# Patient Record
Sex: Female | Born: 1960 | Race: Black or African American | Hispanic: No | Marital: Single | Smoking: Current every day smoker
Health system: Southern US, Community
[De-identification: ages and names within clinical notes are randomized; demographics above are authoritative.]

## PROBLEM LIST (undated history)

## (undated) DIAGNOSIS — D126 Benign neoplasm of colon, unspecified: Secondary | ICD-10-CM

## (undated) DIAGNOSIS — N83209 Unspecified ovarian cyst, unspecified side: Secondary | ICD-10-CM

## (undated) DIAGNOSIS — J189 Pneumonia, unspecified organism: Secondary | ICD-10-CM

## (undated) DIAGNOSIS — Z720 Tobacco use: Secondary | ICD-10-CM

## (undated) DIAGNOSIS — D649 Anemia, unspecified: Secondary | ICD-10-CM

## (undated) DIAGNOSIS — K219 Gastro-esophageal reflux disease without esophagitis: Secondary | ICD-10-CM

## (undated) DIAGNOSIS — N390 Urinary tract infection, site not specified: Secondary | ICD-10-CM

## (undated) DIAGNOSIS — E876 Hypokalemia: Secondary | ICD-10-CM

## (undated) DIAGNOSIS — R519 Headache, unspecified: Secondary | ICD-10-CM

## (undated) DIAGNOSIS — M1710 Unilateral primary osteoarthritis, unspecified knee: Secondary | ICD-10-CM

## (undated) DIAGNOSIS — G8929 Other chronic pain: Secondary | ICD-10-CM

## (undated) DIAGNOSIS — K222 Esophageal obstruction: Secondary | ICD-10-CM

## (undated) DIAGNOSIS — M25469 Effusion, unspecified knee: Secondary | ICD-10-CM

## (undated) DIAGNOSIS — L0292 Furuncle, unspecified: Secondary | ICD-10-CM

## (undated) DIAGNOSIS — K5790 Diverticulosis of intestine, part unspecified, without perforation or abscess without bleeding: Secondary | ICD-10-CM

## (undated) DIAGNOSIS — L0293 Carbuncle, unspecified: Secondary | ICD-10-CM

## (undated) DIAGNOSIS — I10 Essential (primary) hypertension: Secondary | ICD-10-CM

## (undated) DIAGNOSIS — K635 Polyp of colon: Secondary | ICD-10-CM

## (undated) DIAGNOSIS — N905 Atrophy of vulva: Secondary | ICD-10-CM

## (undated) DIAGNOSIS — N12 Tubulo-interstitial nephritis, not specified as acute or chronic: Secondary | ICD-10-CM

## (undated) DIAGNOSIS — K449 Diaphragmatic hernia without obstruction or gangrene: Secondary | ICD-10-CM

## (undated) DIAGNOSIS — M179 Osteoarthritis of knee, unspecified: Secondary | ICD-10-CM

## (undated) HISTORY — DX: Effusion, unspecified knee: M25.469

## (undated) HISTORY — DX: Gastro-esophageal reflux disease without esophagitis: K21.9

## (undated) HISTORY — DX: Carbuncle, unspecified: L02.93

## (undated) HISTORY — DX: Diverticulosis of intestine, part unspecified, without perforation or abscess without bleeding: K57.90

## (undated) HISTORY — DX: Esophageal obstruction: K22.2

## (undated) HISTORY — DX: Furuncle, unspecified: L02.92

## (undated) HISTORY — DX: Other chronic pain: G89.29

## (undated) HISTORY — DX: Anemia, unspecified: D64.9

## (undated) HISTORY — DX: Essential (primary) hypertension: I10

## (undated) HISTORY — PX: BREAST BIOPSY: SHX20

## (undated) HISTORY — DX: Headache, unspecified: R51.9

## (undated) HISTORY — DX: Osteoarthritis of knee, unspecified: M17.9

## (undated) HISTORY — DX: Urinary tract infection, site not specified: N39.0

## (undated) HISTORY — DX: Benign neoplasm of colon, unspecified: D12.6

## (undated) HISTORY — DX: Diaphragmatic hernia without obstruction or gangrene: K44.9

## (undated) HISTORY — DX: Tubulo-interstitial nephritis, not specified as acute or chronic: N12

## (undated) HISTORY — DX: Hypokalemia: E87.6

## (undated) HISTORY — DX: Tobacco use: Z72.0

## (undated) HISTORY — DX: Atrophy of vulva: N90.5

## (undated) HISTORY — DX: Unspecified ovarian cyst, unspecified side: N83.209

## (undated) HISTORY — DX: Unilateral primary osteoarthritis, unspecified knee: M17.10

## (undated) HISTORY — DX: Polyp of colon: K63.5

## (undated) HISTORY — PX: BREAST CYST EXCISION: SHX579

## (undated) HISTORY — DX: Pneumonia, unspecified organism: J18.9

---

## 1996-02-22 HISTORY — PX: ABDOMINAL HYSTERECTOMY: SHX81

## 1997-07-11 ENCOUNTER — Encounter: Admission: RE | Admit: 1997-07-11 | Discharge: 1997-07-11 | Payer: Self-pay | Admitting: Internal Medicine

## 1997-07-17 ENCOUNTER — Encounter: Admission: RE | Admit: 1997-07-17 | Discharge: 1997-07-17 | Payer: Self-pay | Admitting: Obstetrics

## 1997-07-17 ENCOUNTER — Other Ambulatory Visit: Admission: RE | Admit: 1997-07-17 | Discharge: 1997-07-17 | Payer: Self-pay | Admitting: Obstetrics

## 1997-12-25 ENCOUNTER — Encounter: Payer: Self-pay | Admitting: Emergency Medicine

## 1997-12-25 ENCOUNTER — Emergency Department (HOSPITAL_COMMUNITY): Admission: EM | Admit: 1997-12-25 | Discharge: 1997-12-25 | Payer: Self-pay | Admitting: Emergency Medicine

## 1998-04-20 ENCOUNTER — Emergency Department (HOSPITAL_COMMUNITY): Admission: EM | Admit: 1998-04-20 | Discharge: 1998-04-20 | Payer: Self-pay | Admitting: Emergency Medicine

## 1998-04-24 ENCOUNTER — Encounter: Admission: RE | Admit: 1998-04-24 | Discharge: 1998-04-24 | Payer: Self-pay | Admitting: Internal Medicine

## 1998-06-06 ENCOUNTER — Emergency Department (HOSPITAL_COMMUNITY): Admission: EM | Admit: 1998-06-06 | Discharge: 1998-06-06 | Payer: Self-pay | Admitting: *Deleted

## 1998-06-06 ENCOUNTER — Encounter: Payer: Self-pay | Admitting: Emergency Medicine

## 1998-09-04 ENCOUNTER — Encounter: Admission: RE | Admit: 1998-09-04 | Discharge: 1998-09-04 | Payer: Self-pay | Admitting: Internal Medicine

## 1998-10-08 ENCOUNTER — Encounter: Admission: RE | Admit: 1998-10-08 | Discharge: 1998-10-08 | Payer: Self-pay | Admitting: Obstetrics

## 1998-11-04 ENCOUNTER — Ambulatory Visit (HOSPITAL_COMMUNITY): Admission: RE | Admit: 1998-11-04 | Discharge: 1998-11-04 | Payer: Self-pay

## 1998-12-04 ENCOUNTER — Ambulatory Visit (HOSPITAL_BASED_OUTPATIENT_CLINIC_OR_DEPARTMENT_OTHER): Admission: RE | Admit: 1998-12-04 | Discharge: 1998-12-04 | Payer: Self-pay | Admitting: Surgery

## 1998-12-05 ENCOUNTER — Observation Stay (HOSPITAL_COMMUNITY): Admission: EM | Admit: 1998-12-05 | Discharge: 1998-12-05 | Payer: Self-pay | Admitting: Emergency Medicine

## 1999-03-03 ENCOUNTER — Emergency Department (HOSPITAL_COMMUNITY): Admission: EM | Admit: 1999-03-03 | Discharge: 1999-03-03 | Payer: Self-pay | Admitting: Emergency Medicine

## 1999-03-03 ENCOUNTER — Encounter: Payer: Self-pay | Admitting: Emergency Medicine

## 1999-05-06 ENCOUNTER — Encounter: Admission: RE | Admit: 1999-05-06 | Discharge: 1999-05-06 | Payer: Self-pay | Admitting: Obstetrics

## 1999-05-20 ENCOUNTER — Encounter: Admission: RE | Admit: 1999-05-20 | Discharge: 1999-05-20 | Payer: Self-pay | Admitting: Obstetrics

## 1999-12-23 ENCOUNTER — Encounter: Admission: RE | Admit: 1999-12-23 | Discharge: 1999-12-23 | Payer: Self-pay | Admitting: Hematology and Oncology

## 2000-01-06 ENCOUNTER — Encounter: Admission: RE | Admit: 2000-01-06 | Discharge: 2000-01-06 | Payer: Self-pay | Admitting: Obstetrics

## 2000-01-11 ENCOUNTER — Ambulatory Visit (HOSPITAL_COMMUNITY): Admission: RE | Admit: 2000-01-11 | Discharge: 2000-01-11 | Payer: Self-pay | Admitting: Obstetrics

## 2000-08-07 ENCOUNTER — Encounter: Payer: Self-pay | Admitting: Emergency Medicine

## 2000-08-07 ENCOUNTER — Emergency Department (HOSPITAL_COMMUNITY): Admission: EM | Admit: 2000-08-07 | Discharge: 2000-08-07 | Payer: Self-pay | Admitting: Emergency Medicine

## 2000-09-19 ENCOUNTER — Ambulatory Visit (HOSPITAL_COMMUNITY): Admission: RE | Admit: 2000-09-19 | Discharge: 2000-09-19 | Payer: Self-pay | Admitting: Internal Medicine

## 2000-09-19 ENCOUNTER — Encounter (INDEPENDENT_AMBULATORY_CARE_PROVIDER_SITE_OTHER): Payer: Self-pay | Admitting: *Deleted

## 2000-09-19 ENCOUNTER — Encounter: Payer: Self-pay | Admitting: Internal Medicine

## 2000-10-19 ENCOUNTER — Encounter: Admission: RE | Admit: 2000-10-19 | Discharge: 2000-10-19 | Payer: Self-pay | Admitting: Obstetrics

## 2000-10-31 ENCOUNTER — Encounter (INDEPENDENT_AMBULATORY_CARE_PROVIDER_SITE_OTHER): Payer: Self-pay | Admitting: Specialist

## 2000-10-31 ENCOUNTER — Other Ambulatory Visit: Admission: RE | Admit: 2000-10-31 | Discharge: 2000-10-31 | Payer: Self-pay | Admitting: Internal Medicine

## 2000-10-31 ENCOUNTER — Encounter: Payer: Self-pay | Admitting: Internal Medicine

## 2001-01-11 ENCOUNTER — Ambulatory Visit (HOSPITAL_COMMUNITY): Admission: RE | Admit: 2001-01-11 | Discharge: 2001-01-11 | Payer: Self-pay | Admitting: Obstetrics

## 2001-08-14 ENCOUNTER — Encounter: Admission: RE | Admit: 2001-08-14 | Discharge: 2001-08-14 | Payer: Self-pay | Admitting: Internal Medicine

## 2001-08-17 ENCOUNTER — Ambulatory Visit (HOSPITAL_COMMUNITY): Admission: RE | Admit: 2001-08-17 | Discharge: 2001-08-17 | Payer: Self-pay | Admitting: Obstetrics

## 2001-08-17 ENCOUNTER — Encounter: Payer: Self-pay | Admitting: Obstetrics

## 2002-04-29 ENCOUNTER — Emergency Department (HOSPITAL_COMMUNITY): Admission: EM | Admit: 2002-04-29 | Discharge: 2002-04-30 | Payer: Self-pay | Admitting: Emergency Medicine

## 2002-05-06 ENCOUNTER — Encounter: Admission: RE | Admit: 2002-05-06 | Discharge: 2002-05-06 | Payer: Self-pay | Admitting: Internal Medicine

## 2002-05-20 ENCOUNTER — Encounter: Admission: RE | Admit: 2002-05-20 | Discharge: 2002-05-20 | Payer: Self-pay | Admitting: Internal Medicine

## 2002-06-07 ENCOUNTER — Ambulatory Visit (HOSPITAL_COMMUNITY): Admission: RE | Admit: 2002-06-07 | Discharge: 2002-06-07 | Payer: Self-pay

## 2002-06-14 ENCOUNTER — Encounter: Admission: RE | Admit: 2002-06-14 | Discharge: 2002-06-14 | Payer: Self-pay | Admitting: Internal Medicine

## 2003-01-06 ENCOUNTER — Ambulatory Visit (HOSPITAL_COMMUNITY): Admission: RE | Admit: 2003-01-06 | Discharge: 2003-01-06 | Payer: Self-pay | Admitting: Obstetrics

## 2003-03-06 ENCOUNTER — Ambulatory Visit (HOSPITAL_COMMUNITY): Admission: RE | Admit: 2003-03-06 | Discharge: 2003-03-06 | Payer: Self-pay | Admitting: Obstetrics

## 2003-04-10 ENCOUNTER — Emergency Department (HOSPITAL_COMMUNITY): Admission: EM | Admit: 2003-04-10 | Discharge: 2003-04-10 | Payer: Self-pay | Admitting: Family Medicine

## 2004-04-06 ENCOUNTER — Ambulatory Visit (HOSPITAL_COMMUNITY): Admission: RE | Admit: 2004-04-06 | Discharge: 2004-04-06 | Payer: Self-pay | Admitting: Obstetrics

## 2004-07-02 ENCOUNTER — Ambulatory Visit: Payer: Self-pay | Admitting: Internal Medicine

## 2004-07-07 ENCOUNTER — Ambulatory Visit (HOSPITAL_COMMUNITY): Admission: RE | Admit: 2004-07-07 | Discharge: 2004-07-07 | Payer: Self-pay | Admitting: Internal Medicine

## 2004-07-07 ENCOUNTER — Ambulatory Visit: Payer: Self-pay | Admitting: Internal Medicine

## 2004-08-03 ENCOUNTER — Ambulatory Visit: Payer: Self-pay | Admitting: Internal Medicine

## 2005-06-21 DIAGNOSIS — E876 Hypokalemia: Secondary | ICD-10-CM

## 2005-06-21 DIAGNOSIS — N12 Tubulo-interstitial nephritis, not specified as acute or chronic: Secondary | ICD-10-CM

## 2005-06-21 HISTORY — DX: Tubulo-interstitial nephritis, not specified as acute or chronic: N12

## 2005-06-21 HISTORY — DX: Hypokalemia: E87.6

## 2005-07-04 ENCOUNTER — Emergency Department (HOSPITAL_COMMUNITY): Admission: EM | Admit: 2005-07-04 | Discharge: 2005-07-05 | Payer: Self-pay | Admitting: Emergency Medicine

## 2005-07-08 ENCOUNTER — Ambulatory Visit: Payer: Self-pay | Admitting: Internal Medicine

## 2005-07-15 ENCOUNTER — Ambulatory Visit: Payer: Self-pay | Admitting: Internal Medicine

## 2005-12-20 ENCOUNTER — Ambulatory Visit (HOSPITAL_COMMUNITY): Admission: RE | Admit: 2005-12-20 | Discharge: 2005-12-20 | Payer: Self-pay | Admitting: Obstetrics

## 2005-12-20 LAB — HM MAMMOGRAPHY: HM Mammogram: NORMAL

## 2006-01-21 DIAGNOSIS — N83209 Unspecified ovarian cyst, unspecified side: Secondary | ICD-10-CM

## 2006-01-21 HISTORY — DX: Unspecified ovarian cyst, unspecified side: N83.209

## 2006-02-01 ENCOUNTER — Emergency Department (HOSPITAL_COMMUNITY): Admission: EM | Admit: 2006-02-01 | Discharge: 2006-02-01 | Payer: Self-pay | Admitting: Family Medicine

## 2006-02-08 ENCOUNTER — Ambulatory Visit: Payer: Self-pay | Admitting: Obstetrics & Gynecology

## 2006-02-16 ENCOUNTER — Ambulatory Visit (HOSPITAL_COMMUNITY): Admission: RE | Admit: 2006-02-16 | Discharge: 2006-02-16 | Payer: Self-pay | Admitting: Internal Medicine

## 2006-02-16 ENCOUNTER — Encounter (INDEPENDENT_AMBULATORY_CARE_PROVIDER_SITE_OTHER): Payer: Self-pay | Admitting: *Deleted

## 2006-03-07 DIAGNOSIS — N83209 Unspecified ovarian cyst, unspecified side: Secondary | ICD-10-CM

## 2006-03-07 DIAGNOSIS — F17200 Nicotine dependence, unspecified, uncomplicated: Secondary | ICD-10-CM | POA: Insufficient documentation

## 2006-03-07 DIAGNOSIS — I10 Essential (primary) hypertension: Secondary | ICD-10-CM

## 2006-03-07 DIAGNOSIS — L259 Unspecified contact dermatitis, unspecified cause: Secondary | ICD-10-CM | POA: Insufficient documentation

## 2006-05-03 ENCOUNTER — Ambulatory Visit: Payer: Self-pay | Admitting: *Deleted

## 2006-05-05 ENCOUNTER — Ambulatory Visit (HOSPITAL_COMMUNITY): Admission: RE | Admit: 2006-05-05 | Discharge: 2006-05-05 | Payer: Self-pay | Admitting: Obstetrics & Gynecology

## 2006-10-13 ENCOUNTER — Telehealth: Payer: Self-pay | Admitting: *Deleted

## 2006-10-26 ENCOUNTER — Emergency Department (HOSPITAL_COMMUNITY): Admission: EM | Admit: 2006-10-26 | Discharge: 2006-10-26 | Payer: Self-pay | Admitting: Emergency Medicine

## 2006-10-26 ENCOUNTER — Encounter (INDEPENDENT_AMBULATORY_CARE_PROVIDER_SITE_OTHER): Payer: Self-pay | Admitting: Internal Medicine

## 2006-10-26 ENCOUNTER — Ambulatory Visit: Payer: Self-pay | Admitting: Internal Medicine

## 2006-10-27 LAB — CONVERTED CEMR LAB
Albumin: 4.4 g/dL (ref 3.5–5.2)
BUN: 5 mg/dL — ABNORMAL LOW (ref 6–23)
CO2: 24 meq/L (ref 19–32)
Calcium: 9.1 mg/dL (ref 8.4–10.5)
Cholesterol: 206 mg/dL — ABNORMAL HIGH (ref 0–200)
Glucose, Bld: 89 mg/dL (ref 70–99)
HDL: 66 mg/dL (ref >39)
Hemoglobin: 14.7 g/dL (ref 12.0–15.0)
LDL Cholesterol: 112 mg/dL — ABNORMAL HIGH (ref 0–99)
Potassium: 4.1 meq/L (ref 3.5–5.3)
RBC: 4.65 M/uL (ref 3.87–5.11)
Sodium: 142 meq/L (ref 135–145)
Total Protein: 7.5 g/dL (ref 6.0–8.3)
Triglycerides: 140 mg/dL (ref <150)
WBC: 6.3 10*3/uL (ref 4.0–10.5)

## 2006-12-23 DIAGNOSIS — N905 Atrophy of vulva: Secondary | ICD-10-CM

## 2006-12-23 HISTORY — DX: Atrophy of vulva: N90.5

## 2006-12-28 ENCOUNTER — Ambulatory Visit: Payer: Self-pay | Admitting: Internal Medicine

## 2006-12-28 ENCOUNTER — Encounter (INDEPENDENT_AMBULATORY_CARE_PROVIDER_SITE_OTHER): Payer: Self-pay | Admitting: *Deleted

## 2006-12-28 DIAGNOSIS — N905 Atrophy of vulva: Secondary | ICD-10-CM | POA: Insufficient documentation

## 2006-12-28 LAB — CONVERTED CEMR LAB
Chlamydia, DNA Probe: NEGATIVE
GC Probe Amp, Genital: NEGATIVE

## 2007-01-01 LAB — CONVERTED CEMR LAB: Gardnerella vaginalis: NEGATIVE

## 2007-01-03 ENCOUNTER — Ambulatory Visit (HOSPITAL_COMMUNITY): Admission: RE | Admit: 2007-01-03 | Discharge: 2007-01-03 | Payer: Self-pay | Admitting: Geriatric Medicine

## 2007-02-12 ENCOUNTER — Telehealth: Payer: Self-pay | Admitting: *Deleted

## 2007-05-11 ENCOUNTER — Ambulatory Visit: Payer: Self-pay | Admitting: Hospitalist

## 2007-05-11 ENCOUNTER — Encounter (INDEPENDENT_AMBULATORY_CARE_PROVIDER_SITE_OTHER): Payer: Self-pay | Admitting: Internal Medicine

## 2007-05-11 DIAGNOSIS — L02429 Furuncle of limb, unspecified: Secondary | ICD-10-CM

## 2007-05-11 DIAGNOSIS — B351 Tinea unguium: Secondary | ICD-10-CM | POA: Insufficient documentation

## 2007-05-11 DIAGNOSIS — L02439 Carbuncle of limb, unspecified: Secondary | ICD-10-CM

## 2007-05-11 DIAGNOSIS — F911 Conduct disorder, childhood-onset type: Secondary | ICD-10-CM | POA: Insufficient documentation

## 2007-05-11 LAB — CONVERTED CEMR LAB
BUN: 7 mg/dL (ref 6–23)
Cholesterol: 216 mg/dL — ABNORMAL HIGH (ref 0–200)
HDL: 66 mg/dL (ref >39)
Potassium: 3.8 meq/L (ref 3.5–5.3)
Sodium: 139 meq/L (ref 135–145)
Triglycerides: 76 mg/dL (ref <150)
VLDL: 15 mg/dL (ref 0–40)

## 2007-05-25 ENCOUNTER — Encounter (INDEPENDENT_AMBULATORY_CARE_PROVIDER_SITE_OTHER): Payer: Self-pay | Admitting: Internal Medicine

## 2007-05-25 ENCOUNTER — Encounter (INDEPENDENT_AMBULATORY_CARE_PROVIDER_SITE_OTHER): Payer: Self-pay | Admitting: *Deleted

## 2007-05-25 ENCOUNTER — Ambulatory Visit: Payer: Self-pay | Admitting: Hospitalist

## 2007-05-25 LAB — CONVERTED CEMR LAB
ALT: 17 units/L (ref 0–35)
Albumin: 3.7 g/dL (ref 3.5–5.2)
Indirect Bilirubin: 0.8 mg/dL (ref 0.0–0.9)
Total Protein: 6.9 g/dL (ref 6.0–8.3)

## 2007-05-30 ENCOUNTER — Telehealth (INDEPENDENT_AMBULATORY_CARE_PROVIDER_SITE_OTHER): Payer: Self-pay | Admitting: Internal Medicine

## 2007-09-06 ENCOUNTER — Ambulatory Visit: Payer: Self-pay | Admitting: *Deleted

## 2007-09-19 ENCOUNTER — Encounter (INDEPENDENT_AMBULATORY_CARE_PROVIDER_SITE_OTHER): Payer: Self-pay | Admitting: *Deleted

## 2007-09-19 ENCOUNTER — Ambulatory Visit: Payer: Self-pay | Admitting: Internal Medicine

## 2007-09-19 DIAGNOSIS — B379 Candidiasis, unspecified: Secondary | ICD-10-CM | POA: Insufficient documentation

## 2007-09-19 LAB — CONVERTED CEMR LAB
BUN: 9 mg/dL (ref 6–23)
Creatinine, Ser: 0.85 mg/dL (ref 0.40–1.20)
Potassium: 4.1 meq/L (ref 3.5–5.3)

## 2007-12-12 ENCOUNTER — Telehealth (INDEPENDENT_AMBULATORY_CARE_PROVIDER_SITE_OTHER): Payer: Self-pay | Admitting: *Deleted

## 2007-12-27 ENCOUNTER — Encounter (INDEPENDENT_AMBULATORY_CARE_PROVIDER_SITE_OTHER): Payer: Self-pay | Admitting: Internal Medicine

## 2008-01-03 ENCOUNTER — Ambulatory Visit (HOSPITAL_COMMUNITY): Admission: RE | Admit: 2008-01-03 | Discharge: 2008-01-03 | Payer: Self-pay | Admitting: Obstetrics

## 2008-01-10 ENCOUNTER — Encounter (INDEPENDENT_AMBULATORY_CARE_PROVIDER_SITE_OTHER): Payer: Self-pay | Admitting: *Deleted

## 2008-01-10 ENCOUNTER — Ambulatory Visit: Payer: Self-pay | Admitting: *Deleted

## 2008-01-10 DIAGNOSIS — N898 Other specified noninflammatory disorders of vagina: Secondary | ICD-10-CM | POA: Insufficient documentation

## 2008-01-10 LAB — CONVERTED CEMR LAB
Candida species: NEGATIVE
Trichomonal Vaginitis: NEGATIVE

## 2008-04-08 ENCOUNTER — Emergency Department (HOSPITAL_COMMUNITY): Admission: EM | Admit: 2008-04-08 | Discharge: 2008-04-08 | Payer: Self-pay | Admitting: Family Medicine

## 2008-07-01 ENCOUNTER — Ambulatory Visit: Payer: Self-pay | Admitting: Family Medicine

## 2008-07-10 ENCOUNTER — Telehealth (INDEPENDENT_AMBULATORY_CARE_PROVIDER_SITE_OTHER): Payer: Self-pay | Admitting: *Deleted

## 2008-08-26 ENCOUNTER — Encounter: Payer: Self-pay | Admitting: Internal Medicine

## 2008-09-12 ENCOUNTER — Ambulatory Visit: Payer: Self-pay | Admitting: Family Medicine

## 2008-10-21 ENCOUNTER — Ambulatory Visit: Payer: Self-pay | Admitting: Internal Medicine

## 2008-10-21 DIAGNOSIS — Z860100 Personal history of colon polyps, unspecified: Secondary | ICD-10-CM | POA: Insufficient documentation

## 2008-10-21 DIAGNOSIS — K219 Gastro-esophageal reflux disease without esophagitis: Secondary | ICD-10-CM

## 2008-10-21 DIAGNOSIS — K222 Esophageal obstruction: Secondary | ICD-10-CM

## 2008-10-21 DIAGNOSIS — R1013 Epigastric pain: Secondary | ICD-10-CM

## 2008-10-24 ENCOUNTER — Ambulatory Visit: Payer: Self-pay | Admitting: Family Medicine

## 2008-11-19 ENCOUNTER — Ambulatory Visit: Payer: Self-pay | Admitting: Internal Medicine

## 2008-12-03 ENCOUNTER — Ambulatory Visit: Payer: Self-pay | Admitting: Internal Medicine

## 2008-12-03 DIAGNOSIS — K2981 Duodenitis with bleeding: Secondary | ICD-10-CM

## 2009-01-28 ENCOUNTER — Ambulatory Visit: Payer: Self-pay | Admitting: Family Medicine

## 2009-11-12 ENCOUNTER — Ambulatory Visit: Payer: Self-pay | Admitting: Family Medicine

## 2010-01-04 ENCOUNTER — Ambulatory Visit: Payer: Self-pay | Admitting: Family Medicine

## 2010-03-12 ENCOUNTER — Ambulatory Visit (HOSPITAL_COMMUNITY)
Admission: RE | Admit: 2010-03-12 | Discharge: 2010-03-12 | Payer: Self-pay | Source: Home / Self Care | Attending: Obstetrics | Admitting: Obstetrics

## 2010-03-14 ENCOUNTER — Encounter: Payer: Self-pay | Admitting: *Deleted

## 2010-04-06 ENCOUNTER — Ambulatory Visit (INDEPENDENT_AMBULATORY_CARE_PROVIDER_SITE_OTHER): Payer: Commercial Indemnity | Admitting: Family Medicine

## 2010-04-06 DIAGNOSIS — J029 Acute pharyngitis, unspecified: Secondary | ICD-10-CM

## 2010-04-06 DIAGNOSIS — T169XXA Foreign body in ear, unspecified ear, initial encounter: Secondary | ICD-10-CM

## 2010-05-12 ENCOUNTER — Ambulatory Visit: Payer: Commercial Indemnity | Admitting: Internal Medicine

## 2010-05-12 DIAGNOSIS — Z0289 Encounter for other administrative examinations: Secondary | ICD-10-CM

## 2010-07-09 NOTE — Consult Note (Signed)
   NAME:  Darlene Gonzalez, Darlene Gonzalez                          ACCOUNT NO.:  [id number]   MEDICAL RECORD NO.:  [id number]                   PATIENT TYPE:  UT   LOCATION:  ULT                                  F[facility]ILITY:  [facility]   PHYSICIAN:  Marlane Hatcher, M.D.           DATE OF BIRTH:  Jan 07, 1961   DATE OF CONSULTATION:  DATE OF DISCHARGE:  08/17/2001                                   CONSULTATION   Thank you kindly for sending the patient back my way.  As you know, I saw  her earlier when she had some axillary adenopathy.  I evaluated this and as  you know, she had a normal mammogram.   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  I had actually followed her a couple of times and the  axillary adenopathy has diminished in size, all of which clinically I feel  is related to her dermatitis and not anything that I feel that we need to  pursue further with biopsies.  I saw her again in my office on October 23, 2001 and the axillary adenopathy had diminished, I think.  She had some  small lymph nodes that were less than 1 cm in her right axilla.  Her  dermatitis is in a quiescent stage right now; however, she certainly has  chronic inflammation present which flares up from time to time with her.   At any rate, I will be happy to see her again in the future but I really  think that we have an explanation for this that is understandable given  other negative findings.  I do not feel that any biopsy is warranted.   I told her if she would like to obtain a second opinion elsewhere I am sure  you would be happy to refer her.   Again, I thank you kindly for your confidence in sending her my way.                                               Marlane Hatcher, M.D.    WSB/MEDQ  D:  10/23/2001  T:  10/23/2001  Job:  [id number]   cc:   Madelin Rear. Sherwood Gambler, M.D.  [address]  [place]  [place] [address]  Fax: [phone]   Ladona Horns. Neijstrom, M.D.  [address]  [place]  [place] [address]  Fax: [phone]

## 2010-07-09 NOTE — Group Therapy Note (Signed)
NAME:  Darlene Gonzalez, MCLOUTH NO.:  [id number]   MEDICAL RECORD NO.:  [id number]           PATIENT TYPE:   LOCATION:  [facility]                    [facility]   PHYSICIAN:  Dorthula Perfect, MD          DATE OF BIRTH:   DATE OF SERVICE:  02/08/2006                                  CLINIC NOTE   HISTORY OF PRESENT ILLNESS:  This 50 year old black female is here for  evaluation of right mid quadrant abdominal pain.  In 1998 she had an  abdominal hysterectomy by Dr. Clearance Coots.  The diagnosis according to the  patient was fibroids.   Off and on for at least six months to perhaps a year or two, she has  been having intermittent right lower and right mid quadrant pain.  It  comes on as a sharp pain, lasts for about three days, and then she may  or may not have it the next month or she may go 4-5 months without  having the pain.  She also has right upper quadrant pain that is not  related to anything that she eats.  She has no nausea.  She has no pain  with urination.  She does have occasional constipation problems for  which she uses Colace.  She has no diarrhea.   PHYSICAL EXAMINATION:  VITAL SIGNS:  Height 6 feet, weight 172, blood  pressure 134/90.  ABDOMEN:  Examination of the abdomen reveals a moderate amount of right  upper quadrant area tenderness, there not even to deep palpation.  The  burning pain also is right mid quadrant and upper right lower quadrant  area.  She has no rebound tenderness.  The left side of her abdomen is  negative.  No masses are felt.  PELVIC:  External genitalia and BUS glands are normal.  The vagina was  negative.  Speculum examination reveals what appears to be her cervix,  despite having had a hysterectomy.  However, because it is located  somewhat anteriorly, I am not able to visualize an os.  There are no  pelvic masses noted.  She has no adnexal discomfort on either side.  Masses are not felt.   IMPRESSION:  Abdominal pain, etiology  unknown, rule out ovarian cyst and  gallbladder disease.   DISPOSITION:  1. Gallbladder ultrasound.  2. Abdominal/pelvic ultrasound.  3. She will return in two weeks for results.  4. She has signed a record release so that we can get a copy of her      hysterectomy note to see whether or not the cervix was removed.           ______________________________  Dorthula Perfect, MD     ER/MEDQ  D:  02/08/2006  T:  02/09/2006  Job:  [id number]

## 2011-04-13 ENCOUNTER — Other Ambulatory Visit: Payer: Self-pay | Admitting: Obstetrics

## 2011-04-13 DIAGNOSIS — Z1231 Encounter for screening mammogram for malignant neoplasm of breast: Secondary | ICD-10-CM

## 2011-04-22 ENCOUNTER — Ambulatory Visit (HOSPITAL_COMMUNITY)
Admission: RE | Admit: 2011-04-22 | Discharge: 2011-04-22 | Disposition: A | Discharge status: Home / Self Care | Payer: Commercial Indemnity | Source: Ambulatory Visit | Attending: Obstetrics | Admitting: Obstetrics

## 2011-04-22 DIAGNOSIS — Z1231 Encounter for screening mammogram for malignant neoplasm of breast: Secondary | ICD-10-CM | POA: Insufficient documentation

## 2011-05-10 ENCOUNTER — Other Ambulatory Visit: Payer: Self-pay | Admitting: Family Medicine

## 2011-06-02 ENCOUNTER — Other Ambulatory Visit (HOSPITAL_COMMUNITY)
Admission: RE | Admit: 2011-06-02 | Discharge: 2011-06-02 | Disposition: A | Discharge status: Home / Self Care | Payer: Commercial Indemnity | Source: Ambulatory Visit | Attending: Family Medicine | Admitting: Family Medicine

## 2011-06-02 ENCOUNTER — Other Ambulatory Visit: Payer: Self-pay | Admitting: Physician Assistant

## 2011-06-02 DIAGNOSIS — Z0000 Encounter for general adult medical examination without abnormal findings: Secondary | ICD-10-CM | POA: Insufficient documentation

## 2011-08-10 ENCOUNTER — Other Ambulatory Visit: Payer: Self-pay | Admitting: Obstetrics and Gynecology

## 2012-08-20 ENCOUNTER — Encounter: Payer: Self-pay | Admitting: Medical

## 2012-08-20 ENCOUNTER — Ambulatory Visit (INDEPENDENT_AMBULATORY_CARE_PROVIDER_SITE_OTHER): Payer: Commercial Indemnity | Admitting: Medical

## 2012-08-20 VITALS — BP 148/90 | HR 92 | Temp 98.5°F | Resp 18 | Wt 186.0 lb

## 2012-08-20 DIAGNOSIS — I10 Essential (primary) hypertension: Secondary | ICD-10-CM

## 2012-08-20 DIAGNOSIS — F17200 Nicotine dependence, unspecified, uncomplicated: Secondary | ICD-10-CM

## 2012-08-20 DIAGNOSIS — E785 Hyperlipidemia, unspecified: Secondary | ICD-10-CM

## 2012-08-20 MED ORDER — LISINOPRIL 20 MG PO TABS: 20.0000 mg | ORAL_TABLET | Freq: Every day | ORAL | Status: DC

## 2012-08-20 MED ORDER — TRIAMTERENE-HCTZ 37.5-25 MG PO TABS: 1.0000 | ORAL_TABLET | Freq: Every day | ORAL | Status: DC

## 2012-08-20 MED ORDER — ZOLPIDEM TARTRATE 5 MG PO TABS: 5.0000 mg | ORAL_TABLET | Freq: Every evening | ORAL | Status: DC | PRN

## 2012-08-20 NOTE — Progress Notes (Signed)
Subjective: Here for recheck and to re-establish.  Had went to [facility] in the last year or 2, been seeing them every 68mo.  Decided to return here.  Needs medication refills.  Needs updated labs.  In the past year or 2 was advised of high cholesterol.  Was on medication for brief period, but felt this gave her cramps in her hands.   Stopped this.    She does smoke.  Smoking 10 cigarettes daily.  Exercises through work, does a lot of walking at work in [employer], in hot environment.  eating mostly baked foods.  Still eats some fried foods.  Red meat some.    BP - compliant with medication, doesn't add salt, but using no diet discretion.    Past Medical History  Diagnosis Date  . HTN (hypertension)   . Tobacco abuse   . Hypokalemia 06/2005    2nd to diuretic   . Pyelonephritis 06/2005    Uncomplicated  . Hemorrhagic ovarian cyst 01/2006    Right  . 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 Recurrent boils     Underarms  . Esophageal stricture   . GERD (gastroesophageal reflux disease)   . Diverticulosis   . Hemorrhoids   . Adenomatous colon polyp   . Hyperplastic colon polyp   . UTI (lower urinary tract infection)   . Pneumonia    ROS as in subjective  Objective: Filed Vitals:   08/20/12 1552  BP: 148/90  Pulse: 92  Temp: 98.5 F (36.9 C)  Resp: 18    General appearance: alert, no distress, WD/WN,  Oral cavity: MMM, no lesions Neck: supple, no lymphadenopathy, no thyromegaly, no masses Heart: RRR, normal S1, S2, no murmurs Lungs: CTA bilaterally, no wheezes, rhonchi, or rales Abdomen: +bs, soft, non tender, non distended, no masses, no hepatomegaly, no splenomegaly Pulses: 2+ symmetric, upper and lower extremities, normal cap refill ext: no edema  Assessment: Encounter Diagnoses  Name Primary?  . Essential hypertension, benign Yes  . Hyperlipidemia   . Tobacco use disorder      Plan: Med refills today, she will return for fasting labs, discussed diet, tobacco  use, need for lifestyle changes and to rethink how she views her health and outlook.  Follow-up pending labs.

## 2012-11-22 ENCOUNTER — Telehealth: Payer: Self-pay | Admitting: Medical

## 2012-11-22 MED ORDER — LISINOPRIL 20 MG PO TABS: 20.0000 mg | ORAL_TABLET | Freq: Every day | ORAL | Status: DC

## 2012-11-22 NOTE — Telephone Encounter (Signed)
Pt is requesting refill for Lisinopril 20 mg #30 to her [facility] off [address]

## 2012-11-22 NOTE — Telephone Encounter (Signed)
SENT MED IN 

## 2012-11-29 ENCOUNTER — Other Ambulatory Visit: Payer: Self-pay | Admitting: Medical

## 2012-11-29 NOTE — Telephone Encounter (Signed)
IS THIS OK 

## 2012-11-30 ENCOUNTER — Telehealth: Payer: Self-pay | Admitting: Medical

## 2012-11-30 NOTE — Telephone Encounter (Signed)
lm

## 2012-11-30 NOTE — Telephone Encounter (Signed)
Called in prescription to [facility].  TM

## 2013-02-25 ENCOUNTER — Other Ambulatory Visit: Payer: Self-pay | Admitting: Medical

## 2013-06-11 ENCOUNTER — Telehealth: Payer: Self-pay | Admitting: Internal Medicine

## 2013-06-12 NOTE — Telephone Encounter (Signed)
Pts last colon was October of 2010. Per report pt was to have a repeat colon in 5 years. Left message for pt to call back.

## 2013-06-13 NOTE — Telephone Encounter (Signed)
Spoke with pt and she is aware, recall entered for October.

## 2013-08-27 NOTE — Progress Notes (Signed)
These are standing orders and she has not come in for her lab visit. CLS

## 2013-09-03 ENCOUNTER — Other Ambulatory Visit: Payer: Self-pay | Admitting: Medical

## 2013-09-04 NOTE — Telephone Encounter (Signed)
Is this ok to refill?  

## 2013-09-26 ENCOUNTER — Encounter: Payer: Self-pay | Admitting: Physician Assistant

## 2013-09-26 ENCOUNTER — Ambulatory Visit (INDEPENDENT_AMBULATORY_CARE_PROVIDER_SITE_OTHER): Payer: Commercial Indemnity | Admitting: Physician Assistant

## 2013-09-26 VITALS — BP 136/82 | HR 88 | Temp 98.3°F | Resp 16 | Ht 69.0 in | Wt 188.2 lb

## 2013-09-26 DIAGNOSIS — J209 Acute bronchitis, unspecified: Secondary | ICD-10-CM

## 2013-09-26 DIAGNOSIS — M1710 Unilateral primary osteoarthritis, unspecified knee: Secondary | ICD-10-CM

## 2013-09-26 DIAGNOSIS — M170 Bilateral primary osteoarthritis of knee: Secondary | ICD-10-CM | POA: Insufficient documentation

## 2013-09-26 DIAGNOSIS — J3089 Other allergic rhinitis: Secondary | ICD-10-CM

## 2013-09-26 MED ORDER — HYDROCODONE-ACETAMINOPHEN 10-325 MG PO TABS: 1.0000 | ORAL_TABLET | Freq: Three times a day (TID) | ORAL | Status: DC | PRN

## 2013-09-26 MED ORDER — TRIAMTERENE-HCTZ 37.5-25 MG PO TABS: 1.0000 | ORAL_TABLET | Freq: Every day | ORAL | Status: DC

## 2013-09-26 MED ORDER — ALBUTEROL SULFATE (2.5 MG/3ML) 0.083% IN NEBU: 2.5000 mg | INHALATION_SOLUTION | Freq: Four times a day (QID) | RESPIRATORY_TRACT | Status: DC | PRN

## 2013-09-26 MED ORDER — FLUTICASONE PROPIONATE 50 MCG/ACT NA SUSP: 2.0000 | Freq: Every day | NASAL | Status: DC

## 2013-09-26 MED ORDER — LISINOPRIL 20 MG PO TABS: ORAL_TABLET | ORAL | Status: DC

## 2013-09-26 MED ORDER — AZITHROMYCIN 250 MG PO TABS: ORAL_TABLET | ORAL | Status: DC

## 2013-09-26 MED ORDER — HYDROCOD POLST-CHLORPHEN POLST 10-8 MG/5ML PO LQCR: 5.0000 mL | Freq: Two times a day (BID) | ORAL | Status: DC | PRN

## 2013-09-26 NOTE — Progress Notes (Signed)
Patient presents to clinic today to establish care.  Patient is being seen for an acute visit today, but will return for CPE.  Patient c/o 3 weeks of productive cough associated with some PND and sinus pressure.  Endorses ST initially that has resolved on its own.  Denies fever but endorses chills. Endorses some chest tightness but denies pleuritic chest pain.  Denies fever, recent travel or sick contact.  Patient also needing refill of pain medication for her chronic osteoarthritis of bilateral knees.  Was seeing a Sports Medicine physician in the [place] area who just retired.  Is also requesting referral to Sports Medicine.  Past Medical History  Diagnosis Date  . HTN (hypertension)   . Tobacco abuse   . Hypokalemia 06/2005    2nd to diuretic   . Pyelonephritis 05/979    Uncomplicated  . Hemorrhagic ovarian cyst 01/2006    Right  . 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 Recurrent boils     Underarms  . Esophageal stricture   . GERD (gastroesophageal reflux disease)   . Diverticulosis   . Hemorrhoids   . Adenomatous colon polyp   . Hyperplastic colon polyp   . UTI (lower urinary tract infection)   . Pneumonia   . Fluid in knee     Bilateral  . Osteoarthritis, knee     Bilateral    Current Outpatient Prescriptions on File Prior to Visit  Medication Sig Dispense Refill  . lisinopril (PRINIVIL,ZESTRIL) 20 MG tablet TAKE ONE TABLET BY MOUTH ONCE DAILY  30 tablet  0  . triamterene-hydrochlorothiazide (MAXZIDE-25) 37.5-25 MG per tablet Take 1 tablet by mouth daily.  30 tablet  2   No current facility-administered medications on file prior to visit.    No Known Allergies  Family History  Problem Relation Age of Onset  . Hypertension Father     Deceased  . Liver disease Father     Cirrhosis  . Kidney disease Maternal Aunt   . Healthy Mother     Living  . Diabetes Maternal Aunt     x2  . Healthy Sister     x2  . Healthy Brother     x1    History   Social History  .  Marital Status: Single    Spouse Name: N/A    Number of Children: 0  . Years of Education: N/A   Occupational History  . [employer]    Social History Main Topics  . Smoking status: Current Every Day Smoker -- 0.50 packs/day for 30 years  . Smokeless tobacco: None  . Alcohol Use: Yes     Comment: 4 beers on weekends  . Drug Use: No  . Sexual Activity: None   Other Topics Concern  . None   Social History Narrative   Lives by herself, has stable partner   Review of Systems - See HPI.  All other ROS are negative.  BP 136/82  Pulse 88  Temp(Src) 98.3 F (36.8 C) (Oral)  Resp 16  Ht 5\' 9"  (1.753 m)  Wt 188 lb 4 oz (85.39 kg)  BMI 27.79 kg/m2  SpO2 97%  Physical Exam  Vitals reviewed. Constitutional: She is oriented to person, place, and time and well-developed, well-nourished, and in no distress.  HENT:  Head: Normocephalic and atraumatic.  Right Ear: External ear and ear canal normal. Tympanic membrane is bulging.  Left Ear: External ear and ear canal normal. Tympanic membrane is bulging.  Nose: Right sinus exhibits maxillary sinus tenderness.  Mouth/Throat: Uvula is midline, oropharynx is clear and moist and mucous membranes are normal. No oropharyngeal exudate.  Eyes: Conjunctivae are normal. Pupils are equal, round, and reactive to light.  Neck: Neck supple. No thyromegaly present.  Cardiovascular: Normal rate, regular rhythm, normal heart sounds and intact distal pulses.   Pulmonary/Chest: Effort normal. No respiratory distress. She has wheezes. She has no rales. She exhibits no tenderness.  Lymphadenopathy:    She has no cervical adenopathy.  Neurological: She is alert and oriented to person, place, and time.  Skin: Skin is warm and dry. No rash noted.  Psychiatric: Affect normal.   Assessment/Plan: Acute bronchitis Rx Azithromycin.  Increase fluid intake.  Rx tussionex for cough. Albuterol for wheeze.  Mucinex.  Rest.  Humidifier in bedroom.  Return precautions  discussed with patient.  Primary osteoarthritis of both knees Rx Norco 10-325 mg.  Referral placed to Sports Medicine.

## 2013-09-26 NOTE — Patient Instructions (Signed)
Increase your fluid intake.  Rest.  Take Azithromycin as directed.  Use Tussionex for cough.  Take a daily Zyrtec and use Flonase daily for allergies.   Use albuterol inhaler as needed for chest tightness, taking no more than every 6 hours.  Place a humidifier in the bedroom.  Call or return to clinic if symptoms are not improving.     You will get a call from Dr. Ericka Pontiff office for an appointment.  Acute Bronchitis Bronchitis is inflammation of the airways that extend from the windpipe into the lungs (bronchi). The inflammation often causes mucus to develop. This leads to a cough, which is the most common symptom of bronchitis.  In acute bronchitis, the condition usually develops suddenly and goes away over time, usually in a couple weeks. Smoking, allergies, and asthma can make bronchitis worse. Repeated episodes of bronchitis may cause further lung problems.  CAUSES Acute bronchitis is most often caused by the same virus that causes a cold. The virus can spread from person to person (contagious) through coughing, sneezing, and touching contaminated objects. SIGNS AND SYMPTOMS   Cough.   Fever.   Coughing up mucus.   Body aches.   Chest congestion.   Chills.   Shortness of breath.   Sore throat.  DIAGNOSIS  Acute bronchitis is usually diagnosed through a physical exam. Your health care provider will also ask you questions about your medical history. Tests, such as chest X-rays, are sometimes done to rule out other conditions.  TREATMENT  Acute bronchitis usually goes away in a couple weeks. Oftentimes, no medical treatment is necessary. Medicines are sometimes given for relief of fever or cough. Antibiotic medicines are usually not needed but may be prescribed in certain situations. In some cases, an inhaler may be recommended to help reduce shortness of breath and control the cough. A cool mist vaporizer may also be used to help thin bronchial secretions and make it easier  to clear the chest.  HOME CARE INSTRUCTIONS  Get plenty of rest.   Drink enough fluids to keep your urine clear or pale yellow (unless you have a medical condition that requires fluid restriction). Increasing fluids may help thin your respiratory secretions (sputum) and reduce chest congestion, and it will prevent dehydration.   Take medicines only as directed by your health care provider.  If you were prescribed an antibiotic medicine, finish it all even if you start to feel better.  Avoid smoking and secondhand smoke. Exposure to cigarette smoke or irritating chemicals will make bronchitis worse. If you are a smoker, consider using nicotine gum or skin patches to help control withdrawal symptoms. Quitting smoking will help your lungs heal faster.   Reduce the chances of another bout of acute bronchitis by washing your hands frequently, avoiding people with cold symptoms, and trying not to touch your hands to your mouth, nose, or eyes.   Keep all follow-up visits as directed by your health care provider.  SEEK MEDICAL CARE IF: Your symptoms do not improve after 1 week of treatment.  SEEK IMMEDIATE MEDICAL CARE IF:  You develop an increased fever or chills.   You have chest pain.   You have severe shortness of breath.  You have bloody sputum.   You develop dehydration.  You faint or repeatedly feel like you are going to pass out.  You develop repeated vomiting.  You develop a severe headache. MAKE SURE YOU:   Understand these instructions.  Will watch your condition.  Will get help  right away if you are not doing well or get worse. Document Released: 03/17/2004 Document Revised: 06/24/2013 Document Reviewed: 07/31/2012 [facility] Patient Information 2015 [place], [place]. This information is not intended to replace advice given to you by your health care provider. Make sure you discuss any questions you have with your health care provider.

## 2013-09-26 NOTE — Assessment & Plan Note (Signed)
Rx Azithromycin.  Increase fluid intake.  Rx tussionex for cough. Albuterol for wheeze.  Mucinex.  Rest.  Humidifier in bedroom.  Return precautions discussed with patient.

## 2013-09-26 NOTE — Assessment & Plan Note (Signed)
Rx Norco 10-325 mg.  Referral placed to Sports Medicine.

## 2013-09-26 NOTE — Progress Notes (Signed)
Pre visit review using our clinic review tool, if applicable. No additional management support is needed unless otherwise documented below in the visit note/SLS  

## 2013-09-27 ENCOUNTER — Telehealth: Payer: Self-pay | Admitting: Physician Assistant

## 2013-09-27 NOTE — Telephone Encounter (Signed)
Relevant patient education assigned to patient using Emmi. ° °

## 2013-10-02 ENCOUNTER — Ambulatory Visit: Payer: Commercial Indemnity | Admitting: Family Medicine

## 2013-10-03 ENCOUNTER — Ambulatory Visit (INDEPENDENT_AMBULATORY_CARE_PROVIDER_SITE_OTHER): Payer: Managed Care, Other (non HMO) | Admitting: Family Medicine

## 2013-10-03 ENCOUNTER — Telehealth: Payer: Self-pay | Admitting: Physician Assistant

## 2013-10-03 ENCOUNTER — Encounter: Payer: Self-pay | Admitting: Family Medicine

## 2013-10-03 VITALS — BP 138/84 | HR 77 | Ht 71.0 in | Wt 188.0 lb

## 2013-10-03 DIAGNOSIS — L989 Disorder of the skin and subcutaneous tissue, unspecified: Secondary | ICD-10-CM

## 2013-10-03 DIAGNOSIS — M25562 Pain in left knee: Principal | ICD-10-CM

## 2013-10-03 DIAGNOSIS — M170 Bilateral primary osteoarthritis of knee: Secondary | ICD-10-CM

## 2013-10-03 DIAGNOSIS — M25569 Pain in unspecified knee: Secondary | ICD-10-CM

## 2013-10-03 DIAGNOSIS — M1710 Unilateral primary osteoarthritis, unspecified knee: Secondary | ICD-10-CM

## 2013-10-03 DIAGNOSIS — M25561 Pain in right knee: Secondary | ICD-10-CM

## 2013-10-03 MED ORDER — METHYLPREDNISOLONE ACETATE 40 MG/ML IJ SUSP
40.0000 mg | Freq: Once | INTRAMUSCULAR | Status: AC
  Administered 2013-10-03: 40 mg via INTRA_ARTICULAR

## 2013-10-03 NOTE — Telephone Encounter (Addendum)
Patient requesting an [facility] D[facility]atologist -- Amy McMichael at [facility] would be the best option. REferral placed.

## 2013-10-03 NOTE — Patient Instructions (Signed)
Below are the general arthritis instructions for the knees: Take tylenol 500mg  1-2 tabs three times a day for pain. Aleve 1-2 tabs twice a day with food Glucosamine sulfate 750mg  twice a day is a supplement that may help. Capsaicin topically up to four times a day may also help with pain. Cortisone injections are an option - you were given these today. If cortisone injections do not help, there are different types of shots that may help but they take longer to take effect. It's important that you continue to stay active. Straight leg raises, knee extensions 3 sets of 10 once a day (add ankle weight if these become too easy). Consider physical therapy to strengthen muscles around the joint that hurts to take pressure off of the joint itself. Shoe inserts with good arch support may be helpful. Walker or cane if needed. Heat or ice 15 minutes at a time 3-4 times a day as needed to help with pain. Water aerobics and cycling with low resistance are the best two types of exercise for arthritis. Follow up with me as needed.

## 2013-10-04 ENCOUNTER — Ambulatory Visit (INDEPENDENT_AMBULATORY_CARE_PROVIDER_SITE_OTHER): Payer: Commercial Indemnity | Admitting: Physician Assistant

## 2013-10-04 ENCOUNTER — Encounter: Payer: Self-pay | Admitting: Physician Assistant

## 2013-10-04 VITALS — BP 126/78 | HR 78 | Temp 99.3°F | Resp 16 | Ht 69.0 in | Wt 192.0 lb

## 2013-10-04 DIAGNOSIS — J209 Acute bronchitis, unspecified: Secondary | ICD-10-CM

## 2013-10-04 MED ORDER — LEVOFLOXACIN 750 MG PO TABS: 750.0000 mg | ORAL_TABLET | Freq: Every day | ORAL | Status: DC

## 2013-10-04 MED ORDER — FLUCONAZOLE 150 MG PO TABS: 150.0000 mg | ORAL_TABLET | Freq: Once | ORAL | Status: DC

## 2013-10-04 MED ORDER — BENZONATATE 100 MG PO CAPS: 100.0000 mg | ORAL_CAPSULE | Freq: Two times a day (BID) | ORAL | Status: DC | PRN

## 2013-10-04 NOTE — Progress Notes (Signed)
Pre visit review using our clinic review tool, if applicable. No additional management support is needed unless otherwise documented below in the visit note/SLS  

## 2013-10-04 NOTE — Progress Notes (Signed)
Patient presents to clinic today c/o continued cough and chest congestion despite treatment with Azithromycin for acute bronchitis.  Patient denies fever, chills, SOB or pleuritic chest pain.  Patient is also requesting FMLA paperwork completion due to missed work.  Past Medical History  Diagnosis Date  . HTN (hypertension)   . Tobacco abuse   . Hypokalemia 06/2005    2nd to diuretic   . Pyelonephritis 03/7515    Uncomplicated  . Hemorrhagic ovarian cyst 01/2006    Right  . 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 Recurrent boils     Underarms  . Esophageal stricture   . GERD (gastroesophageal reflux disease)   . Diverticulosis   . Hemorrhoids   . Adenomatous colon polyp   . Hyperplastic colon polyp   . UTI (lower urinary tract infection)   . Pneumonia   . Fluid in knee     Bilateral  . Osteoarthritis, knee     Bilateral    Current Outpatient Prescriptions on File Prior to Visit  Medication Sig Dispense Refill  . fluticasone (FLONASE) 50 MCG/ACT nasal spray Place 2 sprays into both nostrils daily.  16 g  6  . HYDROcodone-acetaminophen (NORCO) 10-325 MG per tablet Take 1 tablet by mouth every 8 (eight) hours as needed.  30 tablet  0  . lisinopril (PRINIVIL,ZESTRIL) 20 MG tablet TAKE ONE TABLET BY MOUTH ONCE DAILY  30 tablet  2  . triamterene-hydrochlorothiazide (MAXZIDE-25) 37.5-25 MG per tablet Take 1 tablet by mouth daily.  30 tablet  2  . zolpidem (AMBIEN) 10 MG tablet Take 10 mg by mouth at bedtime as needed for sleep.       No current facility-administered medications on file prior to visit.    No Known Allergies  Family History  Problem Relation Age of Onset  . Hypertension Father     Deceased  . Liver disease Father     Cirrhosis  . Kidney disease Maternal Aunt   . Healthy Mother     Living  . Diabetes Maternal Aunt     x2  . Healthy Sister     x2  . Healthy Brother     x1    History   Social History  . Marital Status: Single    Spouse Name: N/A    Number of  Children: 0  . Years of Education: N/A   Occupational History  . [employer]    Social History Main Topics  . Smoking status: Current Every Day Smoker -- 0.50 packs/day for 30 years  . Smokeless tobacco: None  . Alcohol Use: Yes     Comment: 4 beers on weekends  . Drug Use: No  . Sexual Activity: None   Other Topics Concern  . None   Social History Narrative   Lives by herself, has stable partner   Review of Systems - See HPI.  All other ROS are negative.  BP 126/78  Pulse 78  Temp(Src) 99.3 F (37.4 C) (Oral)  Resp 16  Ht 5\' 9"  (1.753 m)  Wt 192 lb (87.091 kg)  BMI 28.34 kg/m2  SpO2 99%  Physical Exam  Vitals reviewed. Constitutional: She is oriented to person, place, and time and well-developed, well-nourished, and in no distress.  HENT:  Head: Normocephalic and atraumatic.  Right Ear: External ear normal.  Left Ear: External ear normal.  Nose: Nose normal.  Mouth/Throat: Oropharynx is clear and moist. No oropharyngeal exudate.  TM within normal limits bilaterally.  Eyes: Conjunctivae are normal. Pupils are  equal, round, and reactive to light.  Neck: Neck supple.  Cardiovascular: Normal rate, regular rhythm, normal heart sounds and intact distal pulses.   Pulmonary/Chest: Effort normal and breath sounds normal. No respiratory distress. She has no wheezes. She has no rales. She exhibits no tenderness.  Neurological: She is alert and oriented to person, place, and time.  Skin: Skin is warm and dry. No rash noted.  Psychiatric: Affect normal.    No results found for this or any previous visit (from the past 2160 hour(s)).  Assessment/Plan: Acute bronchitis Lung exam unremarkable.  Rx Levaquin daily.  Delsym for cough.  Increase fluids.  Rest.  Saline nasal spray.  Probiotic.

## 2013-10-04 NOTE — Patient Instructions (Signed)
Please take medications as directed.  Stay well hydrated.  Continue Flonase daily.  Take the Levaquin until all tablets are gone.  Use Delsym for cough.  Keep a humidifier in the bedroom.  I will call you once your FMLA paperwork has been filled out.

## 2013-10-07 ENCOUNTER — Encounter: Payer: Self-pay | Admitting: Family Medicine

## 2013-10-07 DIAGNOSIS — M25562 Pain in left knee: Principal | ICD-10-CM

## 2013-10-07 DIAGNOSIS — M25561 Pain in right knee: Secondary | ICD-10-CM | POA: Insufficient documentation

## 2013-10-07 NOTE — Assessment & Plan Note (Signed)
Discussed tylenol, nsaids, glucosamine, capsaicin.  Injections given today.  Reviewed HEP.  Heat/ice as needed.  Consider PT.  F/u prn.  After informed written consent, patient was seated on exam table. Right knee was prepped with alcohol swab and utilizing anteromedial approach, patient's right knee was injected intraarticularly with 3:1 marcaine: depomedrol. Patient tolerated the procedure well without immediate complications.  After informed written consent, patient was seated on exam table. Left knee was prepped with alcohol swab and utilizing anteromedial approach, patient's left knee was injected intraarticularly with 3:1 marcaine: depomedrol. Patient tolerated the procedure well without immediate complications.

## 2013-10-07 NOTE — Progress Notes (Signed)
Patient ID: Darlene Gonzalez, female   DOB: [birth year], 53 y.o.   MRN: [id number]  PCP and referred by: Leeanne Rio, PA-C  Subjective:   HPI: Patient is a 53 y.o. female here for bilateral knee pain.  Patient denies known injury She reports having pain in bilateral knees for about 2 years now. Is on her feet a lot at work. Has been elevating, tried meloxicam. Pain mostly around patellae, some medial. Cortisone injection 8 months ago helped. No catching, locking, giving out.  Past Medical History  Diagnosis Date  . HTN (hypertension)   . Tobacco abuse   . Hypokalemia 06/2005    2nd to diuretic   . Pyelonephritis 03/3298    Uncomplicated  . Hemorrhagic ovarian cyst 01/2006    Right  . 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 Recurrent boils     Underarms  . Esophageal stricture   . GERD (gastroesophageal reflux disease)   . Diverticulosis   . Hemorrhoids   . Adenomatous colon polyp   . Hyperplastic colon polyp   . UTI (lower urinary tract infection)   . Pneumonia   . Fluid in knee     Bilateral  . Osteoarthritis, knee     Bilateral    Current Outpatient Prescriptions on File Prior to Visit  Medication Sig Dispense Refill  . lisinopril (PRINIVIL,ZESTRIL) 20 MG tablet TAKE ONE TABLET BY MOUTH ONCE DAILY  30 tablet  2  . triamterene-hydrochlorothiazide (MAXZIDE-25) 37.5-25 MG per tablet Take 1 tablet by mouth daily.  30 tablet  2  . zolpidem (AMBIEN) 10 MG tablet Take 10 mg by mouth at bedtime as needed for sleep.      . fluticasone (FLONASE) 50 MCG/ACT nasal spray Place 2 sprays into both nostrils daily.  16 g  6  . HYDROcodone-acetaminophen (NORCO) 10-325 MG per tablet Take 1 tablet by mouth every 8 (eight) hours as needed.  30 tablet  0   No current facility-administered medications on file prior to visit.    Past Surgical History  Procedure Laterality Date  . Abdominal hysterectomy  1998    Dr Jodi Mourning for fibroids  . Breast biopsy    . Breast cyst excision      Right     No Known Allergies  History   Social History  . Marital Status: Single    Spouse Name: N/A    Number of Children: 0  . Years of Education: N/A   Occupational History  . [employer]    Social History Main Topics  . Smoking status: Current Every Day Smoker -- 0.50 packs/day for 30 years  . Smokeless tobacco: Not on file  . Alcohol Use: Yes     Comment: 4 beers on weekends  . Drug Use: No  . Sexual Activity: Not on file   Other Topics Concern  . Not on file   Social History Narrative   Lives by herself, has stable partner    Family History  Problem Relation Age of Onset  . Hypertension Father     Deceased  . Liver disease Father     Cirrhosis  . Kidney disease Maternal Aunt   . Healthy Mother     Living  . Diabetes Maternal Aunt     x2  . Healthy Sister     x2  . Healthy Brother     x1    BP 138/84  Pulse 77  Ht 5\' 11"  (1.803 m)  Wt 188 lb (85.276 kg)  BMI 26.23  kg/m2  Review of Systems: See HPI above.    Objective:  Physical Exam:  Gen: NAD  Bilateral knees: No gross deformity, ecchymoses, swelling. TTP medial joint lines and post patellar facets. FROM. Negative ant/post drawers. Negative valgus/varus testing. Negative lachmanns. Negative mcmurrays, apleys, patellar apprehension. NV intact distally.    Assessment & Plan:  1. Bilateral knee pain - 2/2 DJD - Discussed tylenol, nsaids, glucosamine, capsaicin.  Injections given today.  Reviewed HEP.  Heat/ice as needed.  Consider PT.  F/u prn.  After informed written consent, patient was seated on exam table. Right knee was prepped with alcohol swab and utilizing anteromedial approach, patient's right knee was injected intraarticularly with 3:1 marcaine: depomedrol. Patient tolerated the procedure well without immediate complications.  After informed written consent, patient was seated on exam table. Left knee was prepped with alcohol swab and utilizing anteromedial approach, patient's left knee was  injected intraarticularly with 3:1 marcaine: depomedrol. Patient tolerated the procedure well without immediate complications.

## 2013-10-10 ENCOUNTER — Encounter: Payer: Self-pay | Admitting: Internal Medicine

## 2013-10-14 NOTE — Assessment & Plan Note (Signed)
Lung exam unremarkable.  Rx Levaquin daily.  Delsym for cough.  Increase fluids.  Rest.  Saline nasal spray.  Probiotic.

## 2013-11-20 ENCOUNTER — Telehealth: Payer: Self-pay | Admitting: Internal Medicine

## 2013-11-21 NOTE — Telephone Encounter (Signed)
Left message for pt to call back.  Spoke with pt and she states that she is having the feeling that food is getting stuck in her throat and would like to schedule EGD and Colon. She is due for colon. Would you like to see pt for an OV or can she be scheduled for direct procedures? Please advise.

## 2013-11-21 NOTE — Telephone Encounter (Signed)
She needs to be seen in the office for dysphagia. At that time we can determine if EGD with colonoscopy is appropriate.

## 2013-11-22 NOTE — Telephone Encounter (Signed)
Left message for patient to call back  

## 2013-11-25 NOTE — Telephone Encounter (Signed)
Pt scheduled to see Dr. Henrene Pastor 01/14/14@3 :15pm. Pt aware of appt.

## 2013-12-25 ENCOUNTER — Other Ambulatory Visit: Payer: Self-pay | Admitting: Physician Assistant

## 2013-12-25 DIAGNOSIS — Z1231 Encounter for screening mammogram for malignant neoplasm of breast: Secondary | ICD-10-CM

## 2013-12-31 ENCOUNTER — Ambulatory Visit (HOSPITAL_COMMUNITY)
Admission: RE | Admit: 2013-12-31 | Discharge: 2013-12-31 | Disposition: A | Discharge status: Home / Self Care | Payer: Managed Care, Other (non HMO) | Source: Ambulatory Visit | Attending: Physician Assistant | Admitting: Physician Assistant

## 2013-12-31 DIAGNOSIS — Z1231 Encounter for screening mammogram for malignant neoplasm of breast: Secondary | ICD-10-CM | POA: Diagnosis present

## 2014-01-01 ENCOUNTER — Ambulatory Visit (HOSPITAL_BASED_OUTPATIENT_CLINIC_OR_DEPARTMENT_OTHER)
Admission: RE | Admit: 2014-01-01 | Discharge: 2014-01-01 | Disposition: A | Discharge status: Home / Self Care | Payer: Managed Care, Other (non HMO) | Source: Ambulatory Visit | Attending: Physician Assistant | Admitting: Physician Assistant

## 2014-01-01 ENCOUNTER — Ambulatory Visit (INDEPENDENT_AMBULATORY_CARE_PROVIDER_SITE_OTHER): Payer: Managed Care, Other (non HMO) | Admitting: Physician Assistant

## 2014-01-01 ENCOUNTER — Encounter: Payer: Self-pay | Admitting: Physician Assistant

## 2014-01-01 VITALS — BP 130/90 | HR 77 | Temp 98.6°F | Resp 16 | Ht 69.0 in | Wt 195.1 lb

## 2014-01-01 DIAGNOSIS — M67912 Unspecified disorder of synovium and tendon, left shoulder: Secondary | ICD-10-CM | POA: Insufficient documentation

## 2014-01-01 DIAGNOSIS — I10 Essential (primary) hypertension: Secondary | ICD-10-CM

## 2014-01-01 DIAGNOSIS — Z136 Encounter for screening for cardiovascular disorders: Secondary | ICD-10-CM

## 2014-01-01 DIAGNOSIS — S99921A Unspecified injury of right foot, initial encounter: Secondary | ICD-10-CM

## 2014-01-01 DIAGNOSIS — Z0000 Encounter for general adult medical examination without abnormal findings: Secondary | ICD-10-CM

## 2014-01-01 DIAGNOSIS — M170 Bilateral primary osteoarthritis of knee: Secondary | ICD-10-CM

## 2014-01-01 DIAGNOSIS — Z13820 Encounter for screening for osteoporosis: Secondary | ICD-10-CM

## 2014-01-01 MED ORDER — METHOCARBAMOL 500 MG PO TABS: 500.0000 mg | ORAL_TABLET | Freq: Three times a day (TID) | ORAL | Status: DC | PRN

## 2014-01-01 MED ORDER — HYDROCODONE-ACETAMINOPHEN 10-325 MG PO TABS: 1.0000 | ORAL_TABLET | Freq: Three times a day (TID) | ORAL | Status: DC | PRN

## 2014-01-01 MED ORDER — ZOLPIDEM TARTRATE 10 MG PO TABS: 10.0000 mg | ORAL_TABLET | Freq: Every evening | ORAL | Status: DC | PRN

## 2014-01-01 NOTE — Patient Instructions (Signed)
Please go to the [facility] lab on Saturday for lab work. I have sent over the orders so they know what to check. Please go downstairs for imaging. I will call you with your results. Please take medications as directed for pain. Start a daily fiber supplement to help promote bowel motility.  I will call you once I have completed your Wellness form.  Follow-up with me in 1 month.  Preventive Care for Adults A healthy lifestyle and preventive care can promote health and wellness. Preventive health guidelines for women include the following key practices.  A routine yearly physical is a good way to check with your health care provider about your health and preventive screening. It is a chance to share any concerns and updates on your health and to receive a thorough exam.  Visit your dentist for a routine exam and preventive care every 6 months. Brush your teeth twice a day and floss once a day. Good oral hygiene prevents tooth decay and gum disease.  The frequency of eye exams is based on your age, health, family medical history, use of contact lenses, and other factors. Follow your health care provider's recommendations for frequency of eye exams.  Eat a healthy diet. Foods like vegetables, fruits, whole grains, low-fat dairy products, and lean protein foods contain the nutrients you need without too many calories. Decrease your intake of foods high in solid fats, added sugars, and salt. Eat the right amount of calories for you.Get information about a proper diet from your health care provider, if necessary.  Regular physical exercise is one of the most important things you can do for your health. Most adults should get at least 150 minutes of moderate-intensity exercise (any activity that increases your heart rate and causes you to sweat) each week. In addition, most adults need muscle-strengthening exercises on 2 or more days a week.  Maintain a healthy weight. The body mass index (BMI) is a screening  tool to identify possible weight problems. It provides an estimate of body fat based on height and weight. Your health care provider can find your BMI and can help you achieve or maintain a healthy weight.For adults 20 years and older:  A BMI below 18.5 is considered underweight.  A BMI of 18.5 to 24.9 is normal.  A BMI of 25 to 29.9 is considered overweight.  A BMI of 30 and above is considered obese.  Maintain normal blood lipids and cholesterol levels by exercising and minimizing your intake of saturated fat. Eat a balanced diet with plenty of fruit and vegetables. Blood tests for lipids and cholesterol should begin at age 57 and be repeated every 5 years. If your lipid or cholesterol levels are high, you are over 50, or you are at high risk for heart disease, you may need your cholesterol levels checked more frequently.Ongoing high lipid and cholesterol levels should be treated with medicines if diet and exercise are not working.  If you smoke, find out from your health care provider how to quit. If you do not use tobacco, do not start.  Lung cancer screening is recommended for adults aged 57-80 years who are at high risk for developing lung cancer because of a history of smoking. A yearly low-dose CT scan of the lungs is recommended for people who have at least a 30-pack-year history of smoking and are a current smoker or have quit within the past 15 years. A pack year of smoking is smoking an average of 1 pack of  cigarettes a day for 1 year (for example: 1 pack a day for 30 years or 2 packs a day for 15 years). Yearly screening should continue until the smoker has stopped smoking for at least 15 years. Yearly screening should be stopped for people who develop a health problem that would prevent them from having lung cancer treatment.  If you are pregnant, do not drink alcohol. If you are breastfeeding, be very cautious about drinking alcohol. If you are not pregnant and choose to drink  alcohol, do not have more than 1 drink per day. One drink is considered to be 12 ounces (355 mL) of beer, 5 ounces (148 mL) of wine, or 1.5 ounces (44 mL) of liquor.  Avoid use of street drugs. Do not share needles with anyone. Ask for help if you need support or instructions about stopping the use of drugs.  High blood pressure causes heart disease and increases the risk of stroke. Your blood pressure should be checked at least every 1 to 2 years. Ongoing high blood pressure should be treated with medicines if weight loss and exercise do not work.  If you are 32-33 years old, ask your health care provider if you should take aspirin to prevent strokes.  Diabetes screening involves taking a blood sample to check your fasting blood sugar level. This should be done once every 3 years, after age 26, if you are within normal weight and without risk factors for diabetes. Testing should be considered at a younger age or be carried out more frequently if you are overweight and have at least 1 risk factor for diabetes.  Breast cancer screening is essential preventive care for women. You should practice "breast self-awareness." This means understanding the normal appearance and feel of your breasts and may include breast self-examination. Any changes detected, no matter how small, should be reported to a health care provider. Women in their 47s and 30s should have a clinical breast exam (CBE) by a health care provider as part of a regular health exam every 1 to 3 years. After age 57, women should have a CBE every year. Starting at age 46, women should consider having a mammogram (breast X-ray test) every year. Women who have a family history of breast cancer should talk to their health care provider about genetic screening. Women at a high risk of breast cancer should talk to their health care providers about having an MRI and a mammogram every year.  Breast cancer gene (BRCA)-related cancer risk assessment is  recommended for women who have family members with BRCA-related cancers. BRCA-related cancers include breast, ovarian, tubal, and peritoneal cancers. Having family members with these cancers may be associated with an increased risk for harmful changes (mutations) in the breast cancer genes BRCA1 and BRCA2. Results of the assessment will determine the need for genetic counseling and BRCA1 and BRCA2 testing.  Routine pelvic exams to screen for cancer are no longer recommended for nonpregnant women who are considered low risk for cancer of the pelvic organs (ovaries, uterus, and vagina) and who do not have symptoms. Ask your health care provider if a screening pelvic exam is right for you.  If you have had past treatment for cervical cancer or a condition that could lead to cancer, you need Pap tests and screening for cancer for at least 20 years after your treatment. If Pap tests have been discontinued, your risk factors (such as having a new sexual partner) need to be reassessed to determine if screening  should be resumed. Some women have medical problems that increase the chance of getting cervical cancer. In these cases, your health care provider may recommend more frequent screening and Pap tests.  The HPV test is an additional test that may be used for cervical cancer screening. The HPV test looks for the virus that can cause the cell changes on the cervix. The cells collected during the Pap test can be tested for HPV. The HPV test could be used to screen women aged 24 years and older, and should be used in women of any age who have unclear Pap test results. After the age of 24, women should have HPV testing at the same frequency as a Pap test.  Colorectal cancer can be detected and often prevented. Most routine colorectal cancer screening begins at the age of 16 years and continues through age 75 years. However, your health care provider may recommend screening at an earlier age if you have risk factors  for colon cancer. On a yearly basis, your health care provider may provide home test kits to check for hidden blood in the stool. Use of a small camera at the end of a tube, to directly examine the colon (sigmoidoscopy or colonoscopy), can detect the earliest forms of colorectal cancer. Talk to your health care provider about this at age 41, when routine screening begins. Direct exam of the colon should be repeated every 5-10 years through age 71 years, unless early forms of pre-cancerous polyps or small growths are found.  People who are at an increased risk for hepatitis B should be screened for this virus. You are considered at high risk for hepatitis B if:  You were born in a country where hepatitis B occurs often. Talk with your health care provider about which countries are considered high risk.  Your parents were born in a high-risk country and you have not received a shot to protect against hepatitis B (hepatitis B vaccine).  You have HIV or AIDS.  You use needles to inject street drugs.  You live with, or have sex with, someone who has hepatitis B.  You get hemodialysis treatment.  You take certain medicines for conditions like cancer, organ transplantation, and autoimmune conditions.  Hepatitis C blood testing is recommended for all people born from [birth year] through 1966 and any individual with known risks for hepatitis C.  Practice safe sex. Use condoms and avoid high-risk sexual practices to reduce the spread of sexually transmitted infections (STIs). STIs include gonorrhea, chlamydia, syphilis, trichomonas, herpes, HPV, and human immunodeficiency virus (HIV). Herpes, HIV, and HPV are viral illnesses that have no cure. They can result in disability, cancer, and death.  You should be screened for sexually transmitted illnesses (STIs) including gonorrhea and chlamydia if:  You are sexually active and are younger than 24 years.  You are older than 24 years and your health care  provider tells you that you are at risk for this type of infection.  Your sexual activity has changed since you were last screened and you are at an increased risk for chlamydia or gonorrhea. Ask your health care provider if you are at risk.  If you are at risk of being infected with HIV, it is recommended that you take a prescription medicine daily to prevent HIV infection. This is called preexposure prophylaxis (PrEP). You are considered at risk if:  You are a heterosexual woman, are sexually active, and are at increased risk for HIV infection.  You take drugs by injection.  You are sexually active with a partner who has HIV.  Talk with your health care provider about whether you are at high risk of being infected with HIV. If you choose to begin PrEP, you should first be tested for HIV. You should then be tested every 3 months for as long as you are taking PrEP.  Osteoporosis is a disease in which the bones lose minerals and strength with aging. This can result in serious bone fractures or breaks. The risk of osteoporosis can be identified using a bone density scan. Women ages 25 years and over and women at risk for fractures or osteoporosis should discuss screening with their health care providers. Ask your health care provider whether you should take a calcium supplement or vitamin D to reduce the rate of osteoporosis.  Menopause can be associated with physical symptoms and risks. Hormone replacement therapy is available to decrease symptoms and risks. You should talk to your health care provider about whether hormone replacement therapy is right for you.  Use sunscreen. Apply sunscreen liberally and repeatedly throughout the day. You should seek shade when your shadow is shorter than you. Protect yourself by wearing long sleeves, pants, a wide-brimmed hat, and sunglasses year round, whenever you are outdoors.  Once a month, do a whole body skin exam, using a mirror to look at the skin on  your back. Tell your health care provider of new moles, moles that have irregular borders, moles that are larger than a pencil eraser, or moles that have changed in shape or color.  Stay current with required vaccines (immunizations).  Influenza vaccine. All adults should be immunized every year.  Tetanus, diphtheria, and acellular pertussis (Td, Tdap) vaccine. Pregnant women should receive 1 dose of Tdap vaccine during each pregnancy. The dose should be obtained regardless of the length of time since the last dose. Immunization is preferred during the 27th-36th week of gestation. An adult who has not previously received Tdap or who does not know her vaccine status should receive 1 dose of Tdap. This initial dose should be followed by tetanus and diphtheria toxoids (Td) booster doses every 10 years. Adults with an unknown or incomplete history of completing a 3-dose immunization series with Td-containing vaccines should begin or complete a primary immunization series including a Tdap dose. Adults should receive a Td booster every 10 years.  Varicella vaccine. An adult without evidence of immunity to varicella should receive 2 doses or a second dose if she has previously received 1 dose. Pregnant females who do not have evidence of immunity should receive the first dose after pregnancy. This first dose should be obtained before leaving the health care facility. The second dose should be obtained 4-8 weeks after the first dose.  Human papillomavirus (HPV) vaccine. Females aged 13-26 years who have not received the vaccine previously should obtain the 3-dose series. The vaccine is not recommended for use in pregnant females. However, pregnancy testing is not needed before receiving a dose. If a female is found to be pregnant after receiving a dose, no treatment is needed. In that case, the remaining doses should be delayed until after the pregnancy. Immunization is recommended for any person with an  immunocompromised condition through the age of 51 years if she did not get any or all doses earlier. During the 3-dose series, the second dose should be obtained 4-8 weeks after the first dose. The third dose should be obtained 24 weeks after the first dose and 16 weeks after the  second dose.  Zoster vaccine. One dose is recommended for adults aged 53 years or older unless certain conditions are present.  Measles, mumps, and rubella (MMR) vaccine. Adults born before [birth year] generally are considered immune to measles and mumps. Adults born in [birth year] or later should have 1 or more doses of MMR vaccine unless there is a contraindication to the vaccine or there is laboratory evidence of immunity to each of the three diseases. A routine second dose of MMR vaccine should be obtained at least 28 days after the first dose for students attending postsecondary schools, health care workers, or international travelers. People who received inactivated measles vaccine or an unknown type of measles vaccine during 1963-1967 should receive 2 doses of MMR vaccine. People who received inactivated mumps vaccine or an unknown type of mumps vaccine before 1979 and are at high risk for mumps infection should consider immunization with 2 doses of MMR vaccine. For females of childbearing age, rubella immunity should be determined. If there is no evidence of immunity, females who are not pregnant should be vaccinated. If there is no evidence of immunity, females who are pregnant should delay immunization until after pregnancy. Unvaccinated health care workers born before [birth year] who lack laboratory evidence of measles, mumps, or rubella immunity or laboratory confirmation of disease should consider measles and mumps immunization with 2 doses of MMR vaccine or rubella immunization with 1 dose of MMR vaccine.  Pneumococcal 13-valent conjugate (PCV13) vaccine. When indicated, a person who is uncertain of her immunization history and has no record  of immunization should receive the PCV13 vaccine. An adult aged 63 years or older who has certain medical conditions and has not been previously immunized should receive 1 dose of PCV13 vaccine. This PCV13 should be followed with a dose of pneumococcal polysaccharide (PPSV23) vaccine. The PPSV23 vaccine dose should be obtained at least 8 weeks after the dose of PCV13 vaccine. An adult aged 9 years or older who has certain medical conditions and previously received 1 or more doses of PPSV23 vaccine should receive 1 dose of PCV13. The PCV13 vaccine dose should be obtained 1 or more years after the last PPSV23 vaccine dose.  Pneumococcal polysaccharide (PPSV23) vaccine. When PCV13 is also indicated, PCV13 should be obtained first. All adults aged 34 years and older should be immunized. An adult younger than age 94 years who has certain medical conditions should be immunized. Any person who resides in a nursing home or long-term care facility should be immunized. An adult smoker should be immunized. People with an immunocompromised condition and certain other conditions should receive both PCV13 and PPSV23 vaccines. People with human immunodeficiency virus (HIV) infection should be immunized as soon as possible after diagnosis. Immunization during chemotherapy or radiation therapy should be avoided. Routine use of PPSV23 vaccine is not recommended for American Indians, [place] Natives, or people younger than 65 years unless there are medical conditions that require PPSV23 vaccine. When indicated, people who have unknown immunization and have no record of immunization should receive PPSV23 vaccine. One-time revaccination 5 years after the first dose of PPSV23 is recommended for people aged 19-64 years who have chronic kidney failure, nephrotic syndrome, asplenia, or immunocompromised conditions. People who received 1-2 doses of PPSV23 before age 91 years should receive another dose of PPSV23 vaccine at age 15 years or  later if at least 5 years have passed since the previous dose. Doses of PPSV23 are not needed for people immunized with PPSV23 at or after age 63  years.  Meningococcal vaccine. Adults with asplenia or persistent complement component deficiencies should receive 2 doses of quadrivalent meningococcal conjugate (MenACWY-D) vaccine. The doses should be obtained at least 2 months apart. Microbiologists working with certain meningococcal bacteria, [place] recruits, people at risk during an outbreak, and people who travel to or live in countries with a high rate of meningitis should be immunized. A first-year college student up through age 21 years who is living in a residence hall should receive a dose if she did not receive a dose on or after her 16th birthday. Adults who have certain high-risk conditions should receive one or more doses of vaccine.  Hepatitis A vaccine. Adults who wish to be protected from this disease, have certain high-risk conditions, work with hepatitis A-infected animals, work in hepatitis A research labs, or travel to or work in countries with a high rate of hepatitis A should be immunized. Adults who were previously unvaccinated and who anticipate close contact with an international adoptee during the first 60 days after arrival in the [place] from a country with a high rate of hepatitis A should be immunized.  Hepatitis B vaccine. Adults who wish to be protected from this disease, have certain high-risk conditions, may be exposed to blood or other infectious body fluids, are household contacts or sex partners of hepatitis B positive people, are clients or workers in certain care facilities, or travel to or work in countries with a high rate of hepatitis B should be immunized.  Haemophilus influenzae type b (Hib) vaccine. A previously unvaccinated person with asplenia or sickle cell disease or having a scheduled splenectomy should receive 1 dose of Hib vaccine. Regardless of  previous immunization, a recipient of a hematopoietic stem cell transplant should receive a 3-dose series 6-12 months after her successful transplant. Hib vaccine is not recommended for adults with HIV infection. Preventive Services / Frequency Ages 22 to 28 years  Blood pressure check.** / Every 1 to 2 years.  Lipid and cholesterol check.** / Every 5 years beginning at age 54.  Clinical breast exam.** / Every 3 years for women in their 21s and 38s.  BRCA-related cancer risk assessment.** / For women who have family members with a BRCA-related cancer (breast, ovarian, tubal, or peritoneal cancers).  Pap test.** / Every 2 years from ages 50 through 46. Every 3 years starting at age 74 through age 67 or 29 with a history of 3 consecutive normal Pap tests.  HPV screening.** / Every 3 years from ages 32 through ages 8 to 87 with a history of 3 consecutive normal Pap tests.  Hepatitis C blood test.** / For any individual with known risks for hepatitis C.  Skin self-exam. / Monthly.  Influenza vaccine. / Every year.  Tetanus, diphtheria, and acellular pertussis (Tdap, Td) vaccine.** / Consult your health care provider. Pregnant women should receive 1 dose of Tdap vaccine during each pregnancy. 1 dose of Td every 10 years.  Varicella vaccine.** / Consult your health care provider. Pregnant females who do not have evidence of immunity should receive the first dose after pregnancy.  HPV vaccine. / 3 doses over 6 months, if 72 and younger. The vaccine is not recommended for use in pregnant females. However, pregnancy testing is not needed before receiving a dose.  Measles, mumps, rubella (MMR) vaccine.** / You need at least 1 dose of MMR if you were born in 1957 or later. You may also need a 2nd dose. For females of childbearing age, rubella  immunity should be determined. If there is no evidence of immunity, females who are not pregnant should be vaccinated. If there is no evidence of immunity,  females who are pregnant should delay immunization until after pregnancy.  Pneumococcal 13-valent conjugate (PCV13) vaccine.** / Consult your health care provider.  Pneumococcal polysaccharide (PPSV23) vaccine.** / 1 to 2 doses if you smoke cigarettes or if you have certain conditions.  Meningococcal vaccine.** / 1 dose if you are age 55 to 52 years and a [employer] researcher living in a residence hall, or have one of several medical conditions, you need to get vaccinated against meningococcal disease. You may also need additional booster doses.  Hepatitis A vaccine.** / Consult your health care provider.  Hepatitis B vaccine.** / Consult your health care provider.  Haemophilus influenzae type b (Hib) vaccine.** / Consult your health care provider. Ages 39 to 32 years  Blood pressure check.** / Every 1 to 2 years.  Lipid and cholesterol check.** / Every 5 years beginning at age 68 years.  Lung cancer screening. / Every year if you are aged 22-80 years and have a 30-pack-year history of smoking and currently smoke or have quit within the past 15 years. Yearly screening is stopped once you have quit smoking for at least 15 years or develop a health problem that would prevent you from having lung cancer treatment.  Clinical breast exam.** / Every year after age 66 years.  BRCA-related cancer risk assessment.** / For women who have family members with a BRCA-related cancer (breast, ovarian, tubal, or peritoneal cancers).  Mammogram.** / Every year beginning at age 57 years and continuing for as long as you are in good health. Consult with your health care provider.  Pap test.** / Every 3 years starting at age 83 years through age 31 or 40 years with a history of 3 consecutive normal Pap tests.  HPV screening.** / Every 3 years from ages 2 years through ages 58 to 36 years with a history of 3 consecutive normal Pap tests.  Fecal occult blood test (FOBT) of stool. / Every year  beginning at age 60 years and continuing until age 56 years. You may not need to do this test if you get a colonoscopy every 10 years.  Flexible sigmoidoscopy or colonoscopy.** / Every 5 years for a flexible sigmoidoscopy or every 10 years for a colonoscopy beginning at age 80 years and continuing until age 16 years.  Hepatitis C blood test.** / For all people born from [birth year] through 1966 and any individual with known risks for hepatitis C.  Skin self-exam. / Monthly.  Influenza vaccine. / Every year.  Tetanus, diphtheria, and acellular pertussis (Tdap/Td) vaccine.** / Consult your health care provider. Pregnant women should receive 1 dose of Tdap vaccine during each pregnancy. 1 dose of Td every 10 years.  Varicella vaccine.** / Consult your health care provider. Pregnant females who do not have evidence of immunity should receive the first dose after pregnancy.  Zoster vaccine.** / 1 dose for adults aged 11 years or older.  Measles, mumps, rubella (MMR) vaccine.** / You need at least 1 dose of MMR if you were born in 1957 or later. You may also need a 2nd dose. For females of childbearing age, rubella immunity should be determined. If there is no evidence of immunity, females who are not pregnant should be vaccinated. If there is no evidence of immunity, females who are pregnant should delay immunization until after pregnancy.  Pneumococcal 13-valent conjugate (  PCV13) vaccine.** / Consult your health care provider.  Pneumococcal polysaccharide (PPSV23) vaccine.** / 1 to 2 doses if you smoke cigarettes or if you have certain conditions.  Meningococcal vaccine.** / Consult your health care provider.  Hepatitis A vaccine.** / Consult your health care provider.  Hepatitis B vaccine.** / Consult your health care provider.  Haemophilus influenzae type b (Hib) vaccine.** / Consult your health care provider. Ages 87 years and over  Blood pressure check.** / Every 1 to 2 years.  Lipid and  cholesterol check.** / Every 5 years beginning at age 94 years.  Lung cancer screening. / Every year if you are aged 7-80 years and have a 30-pack-year history of smoking and currently smoke or have quit within the past 15 years. Yearly screening is stopped once you have quit smoking for at least 15 years or develop a health problem that would prevent you from having lung cancer treatment.  Clinical breast exam.** / Every year after age 34 years.  BRCA-related cancer risk assessment.** / For women who have family members with a BRCA-related cancer (breast, ovarian, tubal, or peritoneal cancers).  Mammogram.** / Every year beginning at age 17 years and continuing for as long as you are in good health. Consult with your health care provider.  Pap test.** / Every 3 years starting at age 69 years through age 53 or 31 years with 3 consecutive normal Pap tests. Testing can be stopped between 65 and 70 years with 3 consecutive normal Pap tests and no abnormal Pap or HPV tests in the past 10 years.  HPV screening.** / Every 3 years from ages 72 years through ages 36 or 46 years with a history of 3 consecutive normal Pap tests. Testing can be stopped between 65 and 70 years with 3 consecutive normal Pap tests and no abnormal Pap or HPV tests in the past 10 years.  Fecal occult blood test (FOBT) of stool. / Every year beginning at age 91 years and continuing until age 43 years. You may not need to do this test if you get a colonoscopy every 10 years.  Flexible sigmoidoscopy or colonoscopy.** / Every 5 years for a flexible sigmoidoscopy or every 10 years for a colonoscopy beginning at age 53 years and continuing until age 90 years.  Hepatitis C blood test.** / For all people born from [birth year] through 1966 and any individual with known risks for hepatitis C.  Osteoporosis screening.** / A one-time screening for women ages 65 years and over and women at risk for fractures or osteoporosis.  Skin self-exam. /  Monthly.  Influenza vaccine. / Every year.  Tetanus, diphtheria, and acellular pertussis (Tdap/Td) vaccine.** / 1 dose of Td every 10 years.  Varicella vaccine.** / Consult your health care provider.  Zoster vaccine.** / 1 dose for adults aged 61 years or older.  Pneumococcal 13-valent conjugate (PCV13) vaccine.** / Consult your health care provider.  Pneumococcal polysaccharide (PPSV23) vaccine.** / 1 dose for all adults aged 56 years and older.  Meningococcal vaccine.** / Consult your health care provider.  Hepatitis A vaccine.** / Consult your health care provider.  Hepatitis B vaccine.** / Consult your health care provider.  Haemophilus influenzae type b (Hib) vaccine.** / Consult your health care provider. ** Family history and personal history of risk and conditions may change your health care provider's recommendations. Document Released: 04/05/2001 Document Revised: 06/24/2013 Document Reviewed: 07/05/2010 [facility] Patient Information 2015 [place], [place]. This information is not intended to replace advice given to you  by your health care provider. Make sure you discuss any questions you have with your health care provider.

## 2014-01-01 NOTE — Progress Notes (Signed)
Pre visit review using our clinic review tool, if applicable. No additional management support is needed unless otherwise documented below in the visit note/SLS  

## 2014-01-01 NOTE — Progress Notes (Signed)
Patient presents to clinic today for annual exam.  Patient is fasting for labs.  Acute Concerns: Patient c/o possible broken toes of right foot after walking into a pole last Friday.  Denies bruising.  Endorses pain, swelling and decreased ROM of third and 4th phalanges.  Endorses prior hx of fracture of the same region about 10 years ago.  Has not taken anything for the pain.   Patient also c/o continues left shoulder pain radiating from clavicle around the posterior neck. This has been present for over a month.  Patient has to lift heavy objects over her head at work.  Denies specific incident that flared symptoms.  Denies decreased ROM.  Chronic Issues: Insomnia -- Patient requesting refill of her Ambien. Endorses good rest with medication. Denies daytime somnolence.  Hypertension -- Endorses well controlled with combination of lisinopril and Maxxide.  Denies chest pain, palpitations, lightheadedness or dizziness.  Is due for EKG, lipid panel.  BP Readings from Last 3 Encounters:  01/01/14 130/90  10/04/13 126/78  10/03/13 138/84   Health Maintenance: Dental -- up-to-date Vision -- up-to-date Immunizations -- Flu shot at [facility] 1 month ago. Colonoscopy -- Due in 2020. Mammogram -- Last on 12/31/13.  No abnormal findings. Patient aware. PAP -- up-to-date Bone Density -- Overdue.  Will place order.  Past Medical History  Diagnosis Date  . HTN (hypertension)   . Tobacco abuse   . Hypokalemia 06/2005    2nd to diuretic   . Pyelonephritis 06/6312    Uncomplicated  . Hemorrhagic ovarian cyst 01/2006    Right  . 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 Recurrent boils     Underarms  . Esophageal stricture   . GERD (gastroesophageal reflux disease)   . Diverticulosis   . Hemorrhoids   . Adenomatous colon polyp   . Hyperplastic colon polyp   . UTI (lower urinary tract infection)   . Pneumonia   . Fluid in knee     Bilateral  . Osteoarthritis, knee     Bilateral    Past Surgical  History  Procedure Laterality Date  . Abdominal hysterectomy  1998    Dr Jodi Mourning for fibroids  . Breast biopsy    . Breast cyst excision      Right    Current Outpatient Prescriptions on File Prior to Visit  Medication Sig Dispense Refill  . fluticasone (FLONASE) 50 MCG/ACT nasal spray Place 2 sprays into both nostrils daily. (Patient taking differently: Place 2 sprays into both nostrils daily as needed for allergies. ) 16 g 6  . lisinopril (PRINIVIL,ZESTRIL) 20 MG tablet TAKE ONE TABLET BY MOUTH ONCE DAILY 30 tablet 2  . triamterene-hydrochlorothiazide (MAXZIDE-25) 37.5-25 MG per tablet Take 1 tablet by mouth daily. 30 tablet 2   No current facility-administered medications on file prior to visit.    No Known Allergies  Family History  Problem Relation Age of Onset  . Hypertension Father     Deceased  . Liver disease Father     Cirrhosis  . Kidney disease Maternal Aunt   . Healthy Mother     Living  . Diabetes Maternal Aunt     x2  . Healthy Sister     x2  . Healthy Brother     x1    History   Social History  . Marital Status: Single    Spouse Name: N/A    Number of Children: 0  . Years of Education: N/A   Occupational History  .  [employer]    Social History Main Topics  . Smoking status: Current Every Day Smoker -- 0.50 packs/day for 30 years  . Smokeless tobacco: Not on file  . Alcohol Use: Yes     Comment: 4 beers on weekends  . Drug Use: No  . Sexual Activity: Not on file   Other Topics Concern  . Not on file   Social History Narrative   Lives by herself, has stable partner   Review of Systems  Constitutional: Negative for fever.  HENT: Negative for ear discharge, ear pain, hearing loss and tinnitus.   Eyes: Negative for blurred vision, double vision, photophobia and pain.  Respiratory: Negative for cough and shortness of breath.   Cardiovascular: Negative for chest pain and palpitations.  Gastrointestinal: Negative for heartburn, nausea, vomiting,  abdominal pain, diarrhea, constipation, blood in stool and melena.  Genitourinary: Negative for dysuria, urgency, frequency, hematuria and flank pain.  Musculoskeletal: Positive for joint pain.  Neurological: Negative for dizziness, loss of consciousness and headaches.  Endo/Heme/Allergies: Negative for environmental allergies.  Psychiatric/Behavioral: Negative for depression, suicidal ideas, hallucinations and substance abuse. The patient is not nervous/anxious and does not have insomnia.    BP 130/90 mmHg  Pulse 77  Temp(Src) 98.6 F (37 C) (Oral)  Resp 16  Ht 5\' 9"  (1.753 m)  Wt 195 lb 2 oz (88.508 kg)  BMI 28.80 kg/m2  SpO2 100%  Physical Exam  Constitutional: She is oriented to person, place, and time and well-developed, well-nourished, and in no distress.  HENT:  Head: Normocephalic and atraumatic.  Right Ear: External ear normal.  Left Ear: External ear normal.  Nose: Nose normal.  Mouth/Throat: Oropharynx is clear and moist. No oropharyngeal exudate.  TM within normal limits bilaterally.  Eyes: Conjunctivae and EOM are normal. Pupils are equal, round, and reactive to light.  Neck: Neck supple. No thyromegaly present.  Cardiovascular: Normal rate, regular rhythm, normal heart sounds and intact distal pulses.   Pulmonary/Chest: Effort normal and breath sounds normal. No respiratory distress. She has no wheezes. She has no rales. She exhibits no tenderness.  Abdominal: Soft. Bowel sounds are normal. She exhibits no distension and no mass. There is no tenderness. There is no rebound and no guarding.  Musculoskeletal:       Left shoulder: She exhibits tenderness, pain and decreased strength. She exhibits normal range of motion, no bony tenderness and no spasm.       Right ankle: She exhibits decreased range of motion. She exhibits no swelling and no ecchymosis. Tenderness.  Lymphadenopathy:    She has no cervical adenopathy.  Neurological: She is alert and oriented to person,  place, and time.  Skin: Skin is warm and dry. No rash noted.  Psychiatric: Affect normal.  Vitals reviewed.  Assessment/Plan: Visit for preventive health examination I have reviewed the patient's medical history in detail and updated the computerized patient record. Patient up-to-date on immunizations and mammogram.  Is due for DEXA. Will obtain fasting labs at today's visit.  Screening for ischemic heart disease EKG obtained revealing NSR.  Will obtain lipid panel today. Continue anti-hypertensive regimen. Begin 81 mg ASA.  Screening for osteoporosis Order placed for one density scan.  Injury of toe on right foot Will obtain x-ray to further assess.  RICE.  Supportive footwear.  Continue pain medication.  Buddy-taping in place.  Tendinopathy of rotator cuff Will obtain x-ray to further assess.  Avoid heavy lifting. Continue pain medication.  If not-improving and x-ray unremarkable will refer  to Ortho.  Essential hypertension Well-controlled with current regimen. EKG with NSR. Continue current regimen.

## 2014-01-02 ENCOUNTER — Other Ambulatory Visit: Payer: Self-pay | Admitting: Physician Assistant

## 2014-01-02 DIAGNOSIS — S99921A Unspecified injury of right foot, initial encounter: Secondary | ICD-10-CM | POA: Insufficient documentation

## 2014-01-02 DIAGNOSIS — Z0000 Encounter for general adult medical examination without abnormal findings: Secondary | ICD-10-CM | POA: Insufficient documentation

## 2014-01-02 DIAGNOSIS — Z13820 Encounter for screening for osteoporosis: Secondary | ICD-10-CM | POA: Insufficient documentation

## 2014-01-02 DIAGNOSIS — Z136 Encounter for screening for cardiovascular disorders: Secondary | ICD-10-CM | POA: Insufficient documentation

## 2014-01-02 DIAGNOSIS — M67919 Unspecified disorder of synovium and tendon, unspecified shoulder: Secondary | ICD-10-CM | POA: Insufficient documentation

## 2014-01-02 NOTE — Assessment & Plan Note (Signed)
Well-controlled with current regimen. EKG with NSR. Continue current regimen.

## 2014-01-02 NOTE — Assessment & Plan Note (Signed)
Will obtain x-ray to further assess.  RICE.  Supportive footwear.  Continue pain medication.  Buddy-taping in place.

## 2014-01-02 NOTE — Assessment & Plan Note (Signed)
Order placed for one density scan.

## 2014-01-02 NOTE — Assessment & Plan Note (Signed)
Will obtain x-ray to further assess.  Avoid heavy lifting. Continue pain medication.  If not-improving and x-ray unremarkable will refer to Ortho.

## 2014-01-02 NOTE — Assessment & Plan Note (Signed)
EKG obtained revealing NSR.  Will obtain lipid panel today. Continue anti-hypertensive regimen. Begin 81 mg ASA.

## 2014-01-02 NOTE — Assessment & Plan Note (Signed)
I have reviewed the patient's medical history in detail and updated the computerized patient record. Patient up-to-date on immunizations and mammogram.  Is due for DEXA. Will obtain fasting labs at today's visit.

## 2014-01-03 ENCOUNTER — Telehealth: Payer: Self-pay | Admitting: Physician Assistant

## 2014-01-03 NOTE — Telephone Encounter (Signed)
Caller name: Niaomi, Cartaya Relation to pt: self  Call back number: [phone]   Reason for call: pt inquiring about x ray results. Please call pt job before 12:30pm any time after that pt can be reached on mobile.

## 2014-01-03 NOTE — Telephone Encounter (Signed)
SEE RESULT NOTE 

## 2014-01-03 NOTE — Telephone Encounter (Signed)
Caller name: Demri, Poulton Relation to pt: self  Call back number: [phone]   Reason for call:    Pt called regarding the status of her results and would like to speak with CMA or Einar Pheasant in regards to her results.

## 2014-01-06 ENCOUNTER — Telehealth: Payer: Self-pay | Admitting: Physician Assistant

## 2014-01-06 DIAGNOSIS — Z0000 Encounter for general adult medical examination without abnormal findings: Secondary | ICD-10-CM

## 2014-01-06 DIAGNOSIS — I10 Essential (primary) hypertension: Secondary | ICD-10-CM

## 2014-01-06 NOTE — Telephone Encounter (Signed)
Please call patient regarding her Wellness form.  I do not see where she went to the outside lab for blood work as no results have been sent in.  Can we verify if she went to outside lab as discussed at visit?  Also it seems the patient still has not been informed of her x-ray results. Please review previous notes and call patient with results and recommendations.

## 2014-01-06 NOTE — Telephone Encounter (Signed)
This Message Was Never Forwared To Myself [sent to Team Lead on 11.12.15 [my day off]; therefore, I had no way of knowing that patient was unaware of Imaging results:  Result Notes     Notes Recorded by Reino Bellis, RN on 01/02/2014 at 3:30 PM Left message for call back ------  Notes Recorded by Brunetta Jeans, PA-C on 01/02/2014 at 8:59 AM X-rays are both unremarkable. No fracture noted of foot. Continue buddy taping for toes over the next few days as this will help reduce pain. Continue pain medication as directed. Wear supportive footwear. For the shoulder, I do think there is some tendinopathy in the rotator cuff. Continue care discussed yesterday and avoid overhead lifting as this will worsen symptoms. Symptoms should begin to improve, but if they are persisting, I would recommend she let me send her to an Orthopedist.   Per Durward Fortes, she has spoken with patient RE: these Imaging results 11.16.15 and per VO by provider, placed referral for shoulder pain. I have re-entered the lab orders to be placed for draw at [facility], as the original orders for [facility] were entered incorrectly and never printed and were unfortunately overlooked for faxing to [facility] on [address] for Saturday draw at their lab/SLS

## 2014-01-14 ENCOUNTER — Encounter: Payer: Self-pay | Admitting: Internal Medicine

## 2014-01-14 ENCOUNTER — Telehealth: Payer: Self-pay

## 2014-01-14 ENCOUNTER — Ambulatory Visit (INDEPENDENT_AMBULATORY_CARE_PROVIDER_SITE_OTHER): Payer: Managed Care, Other (non HMO) | Admitting: Internal Medicine

## 2014-01-14 ENCOUNTER — Other Ambulatory Visit (INDEPENDENT_AMBULATORY_CARE_PROVIDER_SITE_OTHER): Payer: Managed Care, Other (non HMO)

## 2014-01-14 VITALS — BP 122/76 | HR 72 | Ht 68.5 in | Wt 190.0 lb

## 2014-01-14 DIAGNOSIS — I10 Essential (primary) hypertension: Secondary | ICD-10-CM

## 2014-01-14 DIAGNOSIS — Z0000 Encounter for general adult medical examination without abnormal findings: Secondary | ICD-10-CM

## 2014-01-14 DIAGNOSIS — R1310 Dysphagia, unspecified: Secondary | ICD-10-CM

## 2014-01-14 DIAGNOSIS — Z86010 Personal history of colonic polyps: Secondary | ICD-10-CM

## 2014-01-14 DIAGNOSIS — K219 Gastro-esophageal reflux disease without esophagitis: Secondary | ICD-10-CM

## 2014-01-14 LAB — CBC WITH DIFFERENTIAL/PLATELET
BASOS PCT: 0.6 % (ref 0.0–3.0)
Basophils Absolute: 0 10*3/uL (ref 0.0–0.1)
Eosinophils Absolute: 0.1 10*3/uL (ref 0.0–0.7)
Eosinophils Relative: 2.2 % (ref 0.0–5.0)
HEMATOCRIT: 41.7 % (ref 36.0–46.0)
HEMOGLOBIN: 13.8 g/dL (ref 12.0–15.0)
LYMPHS ABS: 2.4 10*3/uL (ref 0.7–4.0)
Lymphocytes Relative: 35.6 % (ref 12.0–46.0)
MCHC: 33.2 g/dL (ref 30.0–36.0)
MCV: 93.4 fl (ref 78.0–100.0)
Monocytes Absolute: 0.5 10*3/uL (ref 0.1–1.0)
Monocytes Relative: 8.1 % (ref 3.0–12.0)
Neutro Abs: 3.6 10*3/uL (ref 1.4–7.7)
Neutrophils Relative %: 53.5 % (ref 43.0–77.0)
Platelets: 260 10*3/uL (ref 150.0–400.0)
RBC: 4.47 Mil/uL (ref 3.87–5.11)
RDW: 12.9 % (ref 11.5–15.5)
WBC: 6.7 10*3/uL (ref 4.0–10.5)

## 2014-01-14 LAB — TSH: TSH: 2.91 u[IU]/mL (ref 0.35–4.50)

## 2014-01-14 LAB — BASIC METABOLIC PANEL
BUN: 12 mg/dL (ref 6–23)
CO2: 30 mEq/L (ref 19–32)
Calcium: 9 mg/dL (ref 8.4–10.5)
Chloride: 95 mEq/L — ABNORMAL LOW (ref 96–112)
Creatinine, Ser: 0.8 mg/dL (ref 0.4–1.2)
GFR: 100.37 mL/min (ref >60.00)
GLUCOSE: 80 mg/dL (ref 70–99)
POTASSIUM: 3.4 meq/L — AB (ref 3.5–5.1)
SODIUM: 137 meq/L (ref 135–145)

## 2014-01-14 LAB — HEPATIC FUNCTION PANEL
ALBUMIN: 4.1 g/dL (ref 3.5–5.2)
ALK PHOS: 94 U/L (ref 39–117)
ALT: 19 U/L (ref 0–35)
AST: 24 U/L (ref 0–37)
Bilirubin, Direct: 0 mg/dL (ref 0.0–0.3)
TOTAL PROTEIN: 7.8 g/dL (ref 6.0–8.3)
Total Bilirubin: 0.6 mg/dL (ref 0.2–1.2)

## 2014-01-14 LAB — URINALYSIS, ROUTINE W REFLEX MICROSCOPIC
Bilirubin Urine: NEGATIVE
KETONES UR: NEGATIVE
Leukocytes, UA: NEGATIVE
Nitrite: NEGATIVE
SPECIFIC GRAVITY, URINE: 1.02 (ref 1.000–1.030)
Total Protein, Urine: NEGATIVE
URINE GLUCOSE: NEGATIVE
Urobilinogen, UA: 0.2 (ref 0.0–1.0)
pH: 5.5 (ref 5.0–8.0)

## 2014-01-14 LAB — HEMOGLOBIN A1C: HEMOGLOBIN A1C: 6.3 % (ref 4.6–6.5)

## 2014-01-14 MED ORDER — OMEPRAZOLE 20 MG PO CPDR: 20.0000 mg | DELAYED_RELEASE_CAPSULE | Freq: Every day | ORAL | Status: DC

## 2014-01-14 MED ORDER — MOVIPREP 100 G PO SOLR: 1.0000 | Freq: Once | ORAL | Status: DC

## 2014-01-14 NOTE — Telephone Encounter (Signed)
Spoke with patient and told her I had sent an rx for Omeprazole to her pharmacy, per Dr. Henrene Pastor.  Patient acknowledged and understood.

## 2014-01-14 NOTE — Progress Notes (Signed)
HISTORY OF PRESENT ILLNESS:  Darlene Gonzalez is a 53 y.o. female with the below listed medical history who presents today regarding surveillance colonoscopy in problems with dysphagia. The patient has a history of adenomatous colon polyps on index exam in September 2002. Follow-up elsewhere in 2005 (no details) and most recently here October 2010. That examination revealed moderate pandiverticulosis but no other abnormalities. Follow-up in 5 years recommended. She does have chronic constipation. Next, she has a history of GERD, could've a peptic stricture for which she underwent esophageal dilation in 2006. Her last upper endoscopy in 2010 was performed for GERD. Symptoms improved on PPI. Examination was negative except for mild duodenitis with no evidence for Helicobacter pylori on biopsy. She has been off PPI for some time. She continues with intermittent reflux symptoms. She also reports intermittent solid food dysphagia as well as a sensation that she can smell her food sometime after eating. She has bloating. No other GI complaints. Chronic medical problems are stable  REVIEW OF SYSTEMS:  All non-GI ROS negative except for sinus and allergy, arthritis, cough, headaches, sleeping problems, ankle edema, muscle cramps, night sweats  Past Medical History  Diagnosis Date  . HTN (hypertension)   . Tobacco abuse   . Hypokalemia 06/2005    2nd to diuretic   . Pyelonephritis 09/1273    Uncomplicated  . Hemorrhagic ovarian cyst 01/2006    Right  . 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 Recurrent boils     Underarms  . Esophageal stricture   . GERD (gastroesophageal reflux disease)   . Diverticulosis   . Hemorrhoids   . Adenomatous colon polyp   . Hyperplastic colon polyp   . UTI (lower urinary tract infection)   . Pneumonia   . Fluid in knee     Bilateral  . Osteoarthritis, knee     Bilateral  . Hiatal hernia     Past Surgical History  Procedure Laterality Date  . Abdominal hysterectomy  1998    Dr  Darlene Gonzalez for fibroids  . Breast biopsy    . Breast cyst excision      Right    Social History Darlene Gonzalez  reports that she has been smoking.  She does not have any smokeless tobacco history on file. She reports that she drinks alcohol. She reports that she does not use illicit drugs.  family history includes Diabetes in her maternal aunt; Healthy in her brother, mother, and sister; Hypertension in her father; Kidney disease in her maternal aunt; Liver disease in her father.  No Known Allergies     PHYSICAL EXAMINATION: Vital signs: BP 122/76 mmHg  Pulse 72  Ht 5' 8.5" (1.74 m)  Wt 190 lb (86.183 kg)  BMI 28.47 kg/m2  Constitutional: generally well-appearing, no acute distress Psychiatric: alert and oriented x3, cooperative Eyes: extraocular movements intact, anicteric, conjunctiva pink Mouth: oral pharynx moist, no lesions Neck: supple no lymphadenopathy Cardiovascular: heart regular rate and rhythm, no murmur Lungs: clear to auscultation bilaterally Abdomen: soft, muscle wall tenderness on the right, nondistended, no obvious ascites, no peritoneal signs, normal bowel sounds, no organomegaly Rectal: Deferred until colonoscopy Extremities: no lower extremity edema bilaterally Skin: no lesions on visible extremities Neuro: No focal deficits.   ASSESSMENT:  #1. GERD. Symptomatic. Untreated #2. Typical and atypical dysphagia. Rule out peptic stricture #3. History of adenomatous colon polyps. Due for surveillance   PLAN:  #1. Reflux precautions #2. Prescribe omeprazole 20 mg daily #3. Upper endoscopy with possible esophageal dilation.The nature  of the procedure, as well as the risks, benefits, and alternatives were carefully and thoroughly reviewed with the patient. Ample time for discussion and questions allowed. The patient understood, was satisfied, and agreed to proceed. #4. Surveillance colonoscopy.The nature of the procedure, as well as the risks, benefits, and  alternatives were carefully and thoroughly reviewed with the patient. Ample time for discussion and questions allowed. The patient understood, was satisfied, and agreed to proceed.

## 2014-01-14 NOTE — Patient Instructions (Signed)

## 2014-01-22 ENCOUNTER — Telehealth: Payer: Self-pay | Admitting: Physician Assistant

## 2014-01-22 DIAGNOSIS — Z299 Encounter for prophylactic measures, unspecified: Secondary | ICD-10-CM

## 2014-01-22 NOTE — Telephone Encounter (Signed)
lmovm for pt to come back for labs.

## 2014-01-22 NOTE — Telephone Encounter (Signed)
Can we please see why the lab did not collect a lipid panel when they collected all other labs recently? I am hoping it was collected but the agency did not place result in EMR. I had sent a staff message to RN regarding this but I never heard back.

## 2014-01-23 NOTE — Telephone Encounter (Signed)
Avonda  [phone]  Kineta called back and I told her that we needed for her to come back for some labs, she stated that she could go to the one over on [facility]. Saturday morning if you could fax over the orders. If you have any questions she will be on her next break at 12:30 and you can call cell number listed above

## 2014-01-24 NOTE — Telephone Encounter (Signed)
Patient calling back wanting to know if orders were sent. Best # [phone]

## 2014-01-27 NOTE — Telephone Encounter (Addendum)
Pt states needs a appointment setup with the lab on [address] Saturday @10  AM  ? Advised pt that she would have to call that office to setup a appointment .Marland Kitchen Pt was yelling / upset stating that we would have to schedule the appointment , apparently a nurse from here scheduled her a appointment before please advise ??Marland KitchenMarland Kitchen

## 2014-01-27 NOTE — Telephone Encounter (Signed)
Per CMA lab orders sent.

## 2014-01-28 NOTE — Telephone Encounter (Signed)
[facility]. She states that she was told to go there for labs. I was working last Saturday and printed off orders for patient to take to [facility].

## 2014-02-01 ENCOUNTER — Other Ambulatory Visit: Payer: Self-pay | Admitting: Physician Assistant

## 2014-02-01 LAB — LIPID PANEL W/DIRECT LDL/HDL RATIO
Cholesterol: 193 mg/dL (ref 0–200)
HDL: 53 mg/dL (ref >39)
LDL DIRECT: 147 mg/dL — AB
LDL/HDL RATIO (DIRECT LDL): 2.8 ratio
Total Chol/HDL Ratio: 3.6 Ratio
Triglycerides: 62 mg/dL (ref <150)

## 2014-02-02 ENCOUNTER — Telehealth: Payer: Self-pay | Admitting: Physician Assistant

## 2014-02-02 NOTE — Telephone Encounter (Signed)
Please inform patient that we have finally received the results of her cholesterol levels.  Her LDL is mildly elevated.  Limit intake of saturated fats. Increase aerobic exercise.  We will recheck in 1 year. I will finish completing her Wellness form now that all results are in.

## 2014-02-03 NOTE — Telephone Encounter (Signed)
LMOM with contact name and number [for return call, if needed] RE: results and further provider instructions/SLS  

## 2014-02-05 ENCOUNTER — Other Ambulatory Visit: Payer: Self-pay | Admitting: Family Medicine

## 2014-02-07 NOTE — Telephone Encounter (Signed)
Med filled.  

## 2014-03-20 ENCOUNTER — Ambulatory Visit (AMBULATORY_SURGERY_CENTER): Payer: Managed Care, Other (non HMO) | Admitting: Internal Medicine

## 2014-03-20 ENCOUNTER — Encounter: Payer: Self-pay | Admitting: Internal Medicine

## 2014-03-20 VITALS — BP 113/68 | HR 78 | Temp 97.9°F | Resp 19 | Ht 68.0 in | Wt 190.0 lb

## 2014-03-20 DIAGNOSIS — K219 Gastro-esophageal reflux disease without esophagitis: Secondary | ICD-10-CM

## 2014-03-20 DIAGNOSIS — K2100 Gastro-esophageal reflux disease with esophagitis, without bleeding: Secondary | ICD-10-CM

## 2014-03-20 DIAGNOSIS — Z86010 Personal history of colonic polyps: Secondary | ICD-10-CM

## 2014-03-20 DIAGNOSIS — R1310 Dysphagia, unspecified: Secondary | ICD-10-CM

## 2014-03-20 DIAGNOSIS — K222 Esophageal obstruction: Secondary | ICD-10-CM

## 2014-03-20 MED ORDER — SODIUM CHLORIDE 0.9 % IV SOLN: 500.0000 mL | INTRAVENOUS | Status: DC

## 2014-03-20 NOTE — Op Note (Signed)
[place]  [address] [place], [address]   COLONOSCOPY PROCEDURE REPORT  PATIENT: Darlene, Gonzalez  MR#: [id number] BIRTHDATE: 1960-05-06 , 15  yrs. old GENDER: female ENDOSCOPIST: Eustace Quail, MD REFERRED WI:[id number] Program Recall PROCEDURE DATE:  03/20/2014 PROCEDURE:   Colonoscopy, surveillance First Screening Colonoscopy - Avg.  risk and is 50 yrs.  old or older - No.  Prior Negative Screening - Now for repeat screening. N/A  History of Adenoma - Now for follow-up colonoscopy & has been > or = to 3 yrs.  Yes hx of adenoma.  Has been 3 or more years since last colonoscopy.  Polyps Removed Today? No.  Recommend repeat exam, <10 yrs? No. ASA CLASS:   Class II INDICATIONS:surveillance colonoscopy based on a history of adenomatous colonic polyp(s).. Index exam 2002 with tubular adenoma. Follow-up exam 2005 elsewhere (? Results); last examination here 2010 with diverticulosis only MEDICATIONS: Monitored anesthesia care and Propofol 300 mg IV  DESCRIPTION OF PROCEDURE:   After the risks benefits and alternatives of the procedure were thoroughly explained, informed consent was obtained.  The digital rectal exam revealed no abnormalities of the rectum.   The LB [id number]  endoscope was introduced through the anus and advanced to the cecum, which was identified by both the appendix and ileocecal valve. No adverse events experienced.   The quality of the prep was excellent, using MoviPrep  The instrument was then slowly withdrawn as the colon was fully examined.      COLON FINDINGS: There was moderate diverticulosis noted throughout the entire examined colon.   The examination was otherwise normal. Retroflexed views revealed internal hemorrhoids. The time to cecum=3 minutes 11 seconds.  Withdrawal time=7 minutes 36 seconds. The scope was withdrawn and the procedure completed.  COMPLICATIONS: There were no immediate  complications.  ENDOSCOPIC IMPRESSION: 1.   Moderate diverticulosis was noted throughout the entire examined colon 2.   The examination was otherwise normal  RECOMMENDATIONS: 1.  Continue current colorectal screening recommendations for "routine risk" patients with a repeat colonoscopy in 10 years. 2.  Stop smoking 3.  Upper endoscopy performed today (please see report)  eSigned:  Eustace Quail, MD 03/20/2014 11:05 AM   cc: The Patient and [facility]

## 2014-03-20 NOTE — Op Note (Signed)
[place]  [address] [place], [address]   ENDOSCOPY PROCEDURE REPORT  PATIENT: Darlene, Gonzalez  MR#: [id number] BIRTHDATE: 09/24/1960 , 22  yrs. old GENDER: female ENDOSCOPIST: Eustace Quail, MD REFERRED BY:  .  Self / Office PROCEDURE DATE:  03/20/2014 PROCEDURE:  EGD, diagnostic and Maloney dilation of esophagus   - 67f ASA CLASS:     Class II INDICATIONS:  dysphagia, therapeutic procedure, and history of esophageal reflux. MEDICATIONS: Monitored anesthesia care and Propofol 200 mg IV . Xylocaine 100 mg TOPICAL ANESTHETIC: none  DESCRIPTION OF PROCEDURE: After the risks benefits and alternatives of the procedure were thoroughly explained, informed consent was obtained.  The LB [id number] endoscope was introduced through the mouth and advanced to the second portion of the duodenum , Without limitations.  The instrument was slowly withdrawn as the mucosa was fully examined.  EXAM: The esophagus revealed a large caliber distal ringlike stricture.  Mild associated inflammation.  The stomach was normal. The duodenum was normal.  Retroflexed views revealed a hiatal hernia.     The scope was then withdrawn from the patient and the procedure completed.  THERAPY: 38 French Maloney dilator passed into the esophagus without resistance or heme  COMPLICATIONS: There were no immediate complications.  ENDOSCOPIC IMPRESSION: 1. GERD with mild esophagitis 2. Esophageal stricture status post dilation  RECOMMENDATIONS: 1.  Clear liquids until 1 PM, then soft foods rest iof day.  Resume prior diet tomorrow. 2.  Anti-reflux regimen to be followed 3. Stop smoking 4. Prilosec OTC 20 mg daily for reflux symptoms  REPEAT EXAM:  eSigned:  Eustace Quail, MD 03/20/2014 11:15 AM    CC:The Patient and Raiford Noble, PA-C

## 2014-03-20 NOTE — Patient Instructions (Signed)
Discharge instructions given. Handouts on diverticulosis,high fiber diet, and a dilatation diet. Resume previous medications. Your may notice some irritation in your nose or some drainage.  This may cause feelings of congestion.  This is from the oxygen, which can be very drying.  There is no need for concern, this should clear up in a day or so YOU HAD AN ENDOSCOPIC PROCEDURE TODAY AT [facility]: Refer to the procedure report that was given to you for any specific questions about what was found during the examination.  If the procedure report does not answer your questions, please call your gastroenterologist to clarify.  If you requested that your care partner not be given the details of your procedure findings, then the procedure report has been included in a sealed envelope for you to review at your convenience later.  YOU SHOULD EXPECT: Some feelings of bloating in the abdomen. Passage of more gas than usual.  Walking can help get rid of the air that was put into your GI tract during the procedure and reduce the bloating. If you had a lower endoscopy (such as a colonoscopy or flexible sigmoidoscopy) you may notice spotting of blood in your stool or on the toilet paper. If you underwent a bowel prep for your procedure, then you may not have a normal bowel movement for a few days.  DIET: Your first meal following the procedure should be a light meal and then it is ok to progress to your normal diet.  A half-sandwich or bowl of soup is an example of a good first meal.  Heavy or fried foods are harder to digest and may make you feel nauseous or bloated.  Likewise meals heavy in dairy and vegetables can cause extra gas to form and this can also increase the bloating.  Drink plenty of fluids but you should avoid alcoholic beverages for 24 hours.  ACTIVITY: Your care partner should take you home directly after the procedure.  You should plan to take it easy, moving slowly for the rest of  the day.  You can resume normal activity the day after the procedure however you should NOT DRIVE or use heavy machinery for 24 hours (because of the sedation medicines used during the test).    SYMPTOMS TO REPORT IMMEDIATELY: A gastroenterologist can be reached at any hour.  During normal business hours, 8:30 AM to 5:00 PM Monday through Friday, call [phone].  After hours and on weekends, please call the GI answering service at [phone] who will take a message and have the physician on call contact you.   Following lower endoscopy (colonoscopy or flexible sigmoidoscopy):  Excessive amounts of blood in the stool  Significant tenderness or worsening of abdominal pains  Swelling of the abdomen that is new, acute  Fever of 100F or higher  Following upper endoscopy (EGD)  Vomiting of blood or coffee ground material  New chest pain or pain under the shoulder blades  Painful or persistently difficult swallowing  New shortness of breath  Fever of 100F or higher  Black, tarry-looking stools  FOLLOW UP: If any biopsies were taken you will be contacted by phone or by letter within the next 1-3 weeks.  Call your gastroenterologist if you have not heard about the biopsies in 3 weeks.  Our staff will call the home number listed on your records the next business day following your procedure to check on you and address any questions or concerns that you may have at  that time regarding the information given to you following your procedure. This is a courtesy call and so if there is no answer at the home number and we have not heard from you through the emergency physician on call, we will assume that you have returned to your regular daily activities without incident.  SIGNATURES/CONFIDENTIALITY: You and/or your care partner have signed paperwork which will be entered into your electronic medical record.  These signatures attest to the fact that that the information above on your After Visit  Summary has been reviewed and is understood.  Full responsibility of the confidentiality of this discharge information lies with you and/or your care-partner.

## 2014-03-20 NOTE — Progress Notes (Signed)
Patient awakening,vss,report to rn 

## 2014-03-21 ENCOUNTER — Telehealth: Payer: Self-pay | Admitting: *Deleted

## 2014-03-21 NOTE — Telephone Encounter (Signed)
No answer. No identifier. Message left to call if questions or concerns. 

## 2014-05-09 ENCOUNTER — Telehealth: Payer: Self-pay | Admitting: Physician Assistant

## 2014-05-09 NOTE — Telephone Encounter (Signed)
error 

## 2014-05-12 ENCOUNTER — Telehealth: Payer: Self-pay | Admitting: *Deleted

## 2014-05-12 MED ORDER — TRIAMTERENE-HCTZ 37.5-25 MG PO TABS: 1.0000 | ORAL_TABLET | Freq: Every day | ORAL | Status: DC

## 2014-05-12 NOTE — Telephone Encounter (Signed)
Rx request to pharmacy/SLS Requested drug refills are authorized, however, the patient needs further evaluation and/or laboratory testing before further refills are given. Ask her to make an appointment for this.  

## 2014-05-14 ENCOUNTER — Encounter: Payer: Self-pay | Admitting: Physician Assistant

## 2014-05-14 ENCOUNTER — Ambulatory Visit (INDEPENDENT_AMBULATORY_CARE_PROVIDER_SITE_OTHER): Payer: Managed Care, Other (non HMO) | Admitting: Physician Assistant

## 2014-05-14 ENCOUNTER — Other Ambulatory Visit: Payer: Self-pay | Admitting: Physician Assistant

## 2014-05-14 VITALS — BP 135/83 | HR 88 | Temp 98.7°F | Resp 18 | Ht 68.0 in | Wt 199.1 lb

## 2014-05-14 DIAGNOSIS — J019 Acute sinusitis, unspecified: Secondary | ICD-10-CM

## 2014-05-14 DIAGNOSIS — Z720 Tobacco use: Secondary | ICD-10-CM | POA: Diagnosis not present

## 2014-05-14 DIAGNOSIS — M170 Bilateral primary osteoarthritis of knee: Secondary | ICD-10-CM

## 2014-05-14 MED ORDER — HYDROCODONE-ACETAMINOPHEN 10-325 MG PO TABS: 1.0000 | ORAL_TABLET | Freq: Three times a day (TID) | ORAL | Status: DC | PRN

## 2014-05-14 MED ORDER — BENZONATATE 100 MG PO CAPS: 100.0000 mg | ORAL_CAPSULE | Freq: Three times a day (TID) | ORAL | Status: DC | PRN

## 2014-05-14 MED ORDER — AZITHROMYCIN 250 MG PO TABS: ORAL_TABLET | ORAL | Status: DC

## 2014-05-14 NOTE — Progress Notes (Signed)
Pre visit review using our clinic review tool, if applicable. No additional management support is needed unless otherwise documented below in the visit note/SLS  

## 2014-05-14 NOTE — Progress Notes (Signed)
   Subjective:    Patient ID: Darlene Gonzalez, female    DOB: 1960/10/25, 54 y.o.   MRN: [id number]  HPI Patient presents to the office today for URI type symptoms for 1.5 weeks. Her symptoms include left ear drainage (unsure of color or when it happens " it just feels like it is draining", productive cough with white phlegm and rhinorrhea. She denies fever at this time as well as denies n/v/d.    Review of Systems  Constitutional: Negative for fever, activity change and appetite change.  HENT: Positive for congestion, ear discharge, rhinorrhea, sinus pressure and sore throat. Negative for trouble swallowing.   Eyes: Negative for pain, discharge and itching.  Respiratory: Positive for cough. Negative for chest tightness, shortness of breath and wheezing.   Cardiovascular: Negative for chest pain.  Musculoskeletal: Negative for myalgias, neck pain and neck stiffness.       Objective:   Physical Exam  Constitutional: She is oriented to person, place, and time. She appears well-developed and well-nourished. No distress.  HENT:  Head: Normocephalic.  Right Ear: External ear normal.  Left Ear: External ear normal.  Mouth/Throat: Oropharynx is clear and moist. No oropharyngeal exudate.  No drainage noted in left or right ear. Does not appear to be any fluid behind ear drums  Eyes: Pupils are equal, round, and reactive to light. Right eye exhibits no discharge. Left eye exhibits no discharge.  Neck: No tracheal deviation present.  Cardiovascular: Normal rate, regular rhythm and normal heart sounds.  Exam reveals no gallop and no friction rub.   No murmur heard. Pulmonary/Chest: Effort normal and breath sounds normal.  Lymphadenopathy:    She has no cervical adenopathy.  Neurological: She is alert and oriented to person, place, and time.  Skin: Skin is warm and dry.          Assessment & Plan:  Sinusitis with cough - Patient prescribed Z-pak for sinusitis - Patient prescribed  Tessalon Pearls to be taken three times a day as needed to help alleviate cough.  - Patient not interested in smoking cessation materials at this time.  - Advised to follow up if no improvement in three days or if symptoms worsen.

## 2014-05-14 NOTE — Patient Instructions (Addendum)
Please take the prescribed Z-pak for your sinus infection. Also take the prescribed Tessalon Pearls three times a day. You can start with one pearl and if that is not working, take another one. Continue to get rest and drink plenty of water. If you are not feeling better in the next three days or if your symptoms worsen, please follow up.  The best thing you can do for your health is to quit smoking, you are doing a good job by cutting back. It is not easy but continue to cut down on the amount of cigarettes you smoke. I hope you feel better.   Sinusitis Sinusitis is redness, soreness, and puffiness (inflammation) of the air pockets in the bones of your face (sinuses). The redness, soreness, and puffiness can cause air and mucus to get trapped in your sinuses. This can allow germs to grow and cause an infection.  HOME CARE   Drink enough fluids to keep your pee (urine) clear or pale yellow.  Use a humidifier in your home.  Run a hot shower to create steam in the bathroom. Sit in the bathroom with the door closed. Breathe in the steam 3-4 times a day.  Put a warm, moist washcloth on your face 3-4 times a day, or as told by your doctor.  Use salt water sprays (saline sprays) to wet the thick fluid in your nose. This can help the sinuses drain.  Only take medicine as told by your doctor. GET HELP RIGHT AWAY IF:   Your pain gets worse.  You have very bad headaches.  You are sick to your stomach (nauseous).  You throw up (vomit).  You are very sleepy (drowsy) all the time.  Your face is puffy (swollen).  Your vision changes.  You have a stiff neck.  You have trouble breathing. MAKE SURE YOU:   Understand these instructions.  Will watch your condition.  Will get help right away if you are not doing well or get worse. Document Released: 07/27/2007 Document Revised: 11/02/2011 Document Reviewed: 09/13/2011 [facility] Patient Information 2015 [place], [place]. This information is not  intended to replace advice given to you by your health care provider. Make sure you discuss any questions you have with your health care provider.

## 2014-06-12 ENCOUNTER — Telehealth: Payer: Self-pay | Admitting: Physician Assistant

## 2014-06-12 ENCOUNTER — Other Ambulatory Visit: Payer: Self-pay | Admitting: Physician Assistant

## 2014-06-12 DIAGNOSIS — M170 Bilateral primary osteoarthritis of knee: Secondary | ICD-10-CM

## 2014-07-08 ENCOUNTER — Encounter: Payer: Self-pay | Admitting: Physician Assistant

## 2014-07-08 ENCOUNTER — Telehealth: Payer: Self-pay | Admitting: Physician Assistant

## 2014-07-08 ENCOUNTER — Other Ambulatory Visit: Payer: Self-pay | Admitting: Physician Assistant

## 2014-07-08 MED ORDER — LISINOPRIL 20 MG PO TABS: 20.0000 mg | ORAL_TABLET | Freq: Every day | ORAL | Status: DC

## 2014-07-08 MED ORDER — HYDROCODONE-ACETAMINOPHEN 10-325 MG PO TABS: 1.0000 | ORAL_TABLET | Freq: Three times a day (TID) | ORAL | Status: DC | PRN

## 2014-07-08 MED ORDER — TRIAMTERENE-HCTZ 37.5-25 MG PO TABS: ORAL_TABLET | ORAL | Status: DC

## 2014-07-08 NOTE — Telephone Encounter (Signed)
Will grant refills of BP medications. Sent to pharmacy. Norco refilled.  Can be picked up at front desk but she will have to sign contract and give urine sample at pickup as part of office policy.  Please call patient to assess her other concern.

## 2014-07-08 NOTE — Addendum Note (Signed)
Addended by: Raiford Noble on: 07/08/2014 12:22 PM   Modules accepted: Orders

## 2014-07-08 NOTE — Telephone Encounter (Signed)
error:[id number] ° °

## 2014-07-08 NOTE — Telephone Encounter (Signed)
Pt states she does not have money for co pays and was just seen last month, advised pt she was seen for allergies & coughing, pt stated she would like a refill to hold her over until CPE appointment and would like PA to call her.    Refill request  triamterene-hydrochlorothiazide (MAXZIDE-25) 37.5-25 MG per tablet HYDROcodone-acetaminophen (NORCO) 10-325 MG per tablet  lisinopril (PRINIVIL,ZESTRIL) 20 MG tablet

## 2014-07-09 NOTE — Telephone Encounter (Signed)
Called and [facility] @ 11:31am @ [phone]) asking the pt to RTC regarding note below.//AB/CMA

## 2014-07-10 NOTE — Telephone Encounter (Signed)
Called and [facility] @ 10:47am @ [phone]) asking the pt to RTC.//AB/CMA

## 2014-07-10 NOTE — Telephone Encounter (Addendum)
Pt returning your call best [phone]

## 2014-07-11 NOTE — Telephone Encounter (Signed)
Called and [facility] @ 2:25pm @ [phone]) asking the pt to RTC.//AB/CMA

## 2014-07-18 NOTE — Telephone Encounter (Signed)
Called and [facility] @ 9:16am @ [phone]) asking the pt to RTC regarding note below.//AB/CMA

## 2014-07-25 NOTE — Telephone Encounter (Signed)
Unable to reach the pt by phone.//AB/CMA

## 2015-01-08 ENCOUNTER — Telehealth: Payer: Self-pay | Admitting: Behavioral Health

## 2015-01-08 NOTE — Telephone Encounter (Signed)
Unable to reach patient at time of Pre-Visit Call.  Left message for patient to return call when available.    

## 2015-01-09 ENCOUNTER — Encounter: Payer: Managed Care, Other (non HMO) | Admitting: Physician Assistant

## 2015-01-09 ENCOUNTER — Telehealth: Payer: Self-pay | Admitting: Physician Assistant

## 2015-01-09 NOTE — Telephone Encounter (Signed)
LMOM with contact name and number for return call RE: Declined requested Rx, as patient was informed in May 2016 when given 6 mth supply that she would need office visit prior to future refills; she was schedule for 12.09.16 by front staff, but I have her scheduled for Wed, 01/14/15 at 5:00p for F/U and Medication Mngt/SLS

## 2015-01-09 NOTE — Telephone Encounter (Signed)
Caller name: Avanna Relation to pt: Self Call back number: [phone] Pharmacy: [facility] [place], [address]  Reason for call: Pt requesting her BP meds triamterene-hydrochlorothiazide (MAXZIDE-25) 37.5-25 MG per tablet and lisinopril (PRINIVIL,ZESTRIL) 20 MG tablet. Please advise.

## 2015-01-12 ENCOUNTER — Other Ambulatory Visit: Payer: Self-pay | Admitting: Physician Assistant

## 2015-01-12 NOTE — Telephone Encounter (Signed)
Medication Detail      Disp Refills Start End     triamterene-hydrochlorothiazide ([id number]) 37.5-25 MG per tablet 90 tablet 1 07/08/2014     Sig: TAKE ONE TABLET BY MOUTH ONCE DAILY    E-Prescribing Status: Receipt confirmed by pharmacy (07/08/2014 12:20 PM EDT)   Pharmacy    [facility] [place], [address] at 01/09/2015 4:54 PM     Status: Signed       Expand All Collapse All   LMOM with contact name and number for return call RE: Declined requested Rx, as patient was informed in May 2016 when given 6 mth supply that she would need office visit prior to future refills; she was schedule for 12.09.16 by front staff, but I have her scheduled for Wed, 01/14/15 at 5:00p for F/U and Medication Mngt/SLS        Patient given #15 and must keep 11.23.16 appointment to receive any further refill authorizations/SLS

## 2015-01-14 ENCOUNTER — Telehealth: Payer: Self-pay | Admitting: Physician Assistant

## 2015-01-14 ENCOUNTER — Ambulatory Visit: Payer: Managed Care, Other (non HMO) | Admitting: Physician Assistant

## 2015-01-14 DIAGNOSIS — Z0289 Encounter for other administrative examinations: Secondary | ICD-10-CM

## 2015-01-22 NOTE — Telephone Encounter (Signed)
Pt was no show 01/14/15 5:00pm, acute appt for med refills, pt has not rescheduled but does have a cpe for 01/30/15, per notes Ivin Booty left msg for pt to come in 01/14/15, charge or no charge?

## 2015-01-22 NOTE — Telephone Encounter (Signed)
Charge. 

## 2015-01-30 ENCOUNTER — Encounter: Payer: Managed Care, Other (non HMO) | Admitting: Physician Assistant

## 2015-02-05 ENCOUNTER — Telehealth: Payer: Self-pay | Admitting: Behavioral Health

## 2015-02-05 ENCOUNTER — Encounter: Payer: Self-pay | Admitting: Behavioral Health

## 2015-02-05 NOTE — Telephone Encounter (Signed)
Patient returned the call on her lunch break, but was not able to complete all the pre-visit info.

## 2015-02-05 NOTE — Addendum Note (Signed)
Addended by: Kathlen Brunswick on: 02/05/2015 03:28 PM   Modules accepted: Orders, Medications

## 2015-02-05 NOTE — Telephone Encounter (Signed)
Unable to reach patient at time of Pre-Visit Call.  Left message for patient to return call when available.    

## 2015-02-06 ENCOUNTER — Ambulatory Visit (INDEPENDENT_AMBULATORY_CARE_PROVIDER_SITE_OTHER): Payer: Managed Care, Other (non HMO) | Admitting: Physician Assistant

## 2015-02-06 ENCOUNTER — Encounter: Payer: Self-pay | Admitting: Physician Assistant

## 2015-02-06 VITALS — BP 116/73 | HR 80 | Temp 98.0°F | Resp 16 | Ht 68.0 in | Wt 193.0 lb

## 2015-02-06 DIAGNOSIS — Z0000 Encounter for general adult medical examination without abnormal findings: Secondary | ICD-10-CM | POA: Diagnosis not present

## 2015-02-06 DIAGNOSIS — Z7689 Persons encountering health services in other specified circumstances: Secondary | ICD-10-CM

## 2015-02-06 DIAGNOSIS — I10 Essential (primary) hypertension: Secondary | ICD-10-CM | POA: Diagnosis not present

## 2015-02-06 DIAGNOSIS — K2100 Gastro-esophageal reflux disease with esophagitis, without bleeding: Secondary | ICD-10-CM

## 2015-02-06 DIAGNOSIS — B9689 Other specified bacterial agents as the cause of diseases classified elsewhere: Secondary | ICD-10-CM

## 2015-02-06 DIAGNOSIS — J019 Acute sinusitis, unspecified: Secondary | ICD-10-CM | POA: Diagnosis not present

## 2015-02-06 DIAGNOSIS — Z0001 Encounter for general adult medical examination with abnormal findings: Secondary | ICD-10-CM

## 2015-02-06 LAB — COMPREHENSIVE METABOLIC PANEL
ALT: 21 U/L (ref 0–35)
AST: 24 U/L (ref 0–37)
Albumin: 4.2 g/dL (ref 3.5–5.2)
Alkaline Phosphatase: 99 U/L (ref 39–117)
BUN: 10 mg/dL (ref 6–23)
CALCIUM: 9.6 mg/dL (ref 8.4–10.5)
CHLORIDE: 99 meq/L (ref 96–112)
CO2: 34 meq/L — AB (ref 19–32)
CREATININE: 0.79 mg/dL (ref 0.40–1.20)
GFR: 97.06 mL/min (ref >60.00)
Glucose, Bld: 101 mg/dL — ABNORMAL HIGH (ref 70–99)
Potassium: 4.1 mEq/L (ref 3.5–5.1)
Sodium: 138 mEq/L (ref 135–145)
TOTAL PROTEIN: 7.6 g/dL (ref 6.0–8.3)
Total Bilirubin: 0.6 mg/dL (ref 0.2–1.2)

## 2015-02-06 LAB — CBC
HCT: 40.3 % (ref 36.0–46.0)
HEMOGLOBIN: 13.3 g/dL (ref 12.0–15.0)
MCHC: 33 g/dL (ref 30.0–36.0)
MCV: 92.8 fl (ref 78.0–100.0)
PLATELETS: 257 10*3/uL (ref 150.0–400.0)
RBC: 4.34 Mil/uL (ref 3.87–5.11)
RDW: 13.3 % (ref 11.5–15.5)
WBC: 7.5 10*3/uL (ref 4.0–10.5)

## 2015-02-06 LAB — URINALYSIS, ROUTINE W REFLEX MICROSCOPIC
BILIRUBIN URINE: NEGATIVE
Ketones, ur: NEGATIVE
LEUKOCYTES UA: NEGATIVE
NITRITE: NEGATIVE
PH: 5.5 (ref 5.0–8.0)
Specific Gravity, Urine: 1.025 (ref 1.000–1.030)
TOTAL PROTEIN, URINE-UPE24: NEGATIVE
URINE GLUCOSE: NEGATIVE
Urobilinogen, UA: 0.2 (ref 0.0–1.0)
WBC, UA: NONE SEEN (ref 0–?)

## 2015-02-06 LAB — HEMOGLOBIN A1C: HEMOGLOBIN A1C: 6 % (ref 4.6–6.5)

## 2015-02-06 LAB — LIPID PANEL
CHOL/HDL RATIO: 3
Cholesterol: 211 mg/dL — ABNORMAL HIGH (ref 0–200)
HDL: 60.8 mg/dL (ref >39.00)
LDL Cholesterol: 138 mg/dL — ABNORMAL HIGH (ref 0–99)
NONHDL: 149.7
Triglycerides: 57 mg/dL (ref 0.0–149.0)
VLDL: 11.4 mg/dL (ref 0.0–40.0)

## 2015-02-06 LAB — TSH: TSH: 2.08 u[IU]/mL (ref 0.35–4.50)

## 2015-02-06 LAB — VITAMIN D 25 HYDROXY (VIT D DEFICIENCY, FRACTURES): VITD: 38.76 ng/mL (ref 30.00–100.00)

## 2015-02-06 MED ORDER — BENZONATATE 100 MG PO CAPS: 100.0000 mg | ORAL_CAPSULE | Freq: Two times a day (BID) | ORAL | Status: DC | PRN

## 2015-02-06 MED ORDER — TRIAMTERENE-HCTZ 37.5-25 MG PO TABS
1.0000 | ORAL_TABLET | Freq: Every day | ORAL | Status: DC
Start: 2015-02-06 — End: 2015-07-22

## 2015-02-06 MED ORDER — AMOXICILLIN-POT CLAVULANATE 875-125 MG PO TABS: 1.0000 | ORAL_TABLET | Freq: Two times a day (BID) | ORAL | Status: DC

## 2015-02-06 MED ORDER — LISINOPRIL 20 MG PO TABS: 20.0000 mg | ORAL_TABLET | Freq: Every day | ORAL | Status: DC

## 2015-02-06 NOTE — Assessment & Plan Note (Signed)
Rx Augmentin.  Increase fluids.  Rest.  Saline nasal spray.  Probiotic.  Mucinex as directed.  Humidifier in bedroom. Tessalon per orders.  Call or return to clinic if symptoms are not improving.

## 2015-02-06 NOTE — Progress Notes (Signed)
Pre visit review using our clinic review tool, if applicable. No additional management support is needed unless otherwise documented below in the visit note/SLS  

## 2015-02-06 NOTE — Patient Instructions (Signed)
Please go to the lab for blood work.  I will call you with your results. If your blood work is normal we will follow-up yearly for physicals.  If anything is abnormal we will treat you and get you in for a follow-up.  Please continue medications as directed.  Please take antibiotic as directed.  Increase fluid intake.  Use Saline nasal spray.  Take a daily multivitamin. Use Tessalon as directed for cough.  Place a humidifier in the bedroom.  Please call or return clinic if symptoms are not improving.  Sinusitis Sinusitis is redness, soreness, and swelling (inflammation) of the paranasal sinuses. Paranasal sinuses are air pockets within the bones of your face (beneath the eyes, the middle of the forehead, or above the eyes). In healthy paranasal sinuses, mucus is able to drain out, and air is able to circulate through them by way of your nose. However, when your paranasal sinuses are inflamed, mucus and air can become trapped. This can allow bacteria and other germs to grow and cause infection. Sinusitis can develop quickly and last only a short time (acute) or continue over a long period (chronic). Sinusitis that lasts for more than 12 weeks is considered chronic.  CAUSES  Causes of sinusitis include:  Allergies.  Structural abnormalities, such as displacement of the cartilage that separates your nostrils (deviated septum), which can decrease the air flow through your nose and sinuses and affect sinus drainage.  Functional abnormalities, such as when the small hairs (cilia) that line your sinuses and help remove mucus do not work properly or are not present. SYMPTOMS  Symptoms of acute and chronic sinusitis are the same. The primary symptoms are pain and pressure around the affected sinuses. Other symptoms include:  Upper toothache.  Earache.  Headache.  Bad breath.  Decreased sense of smell and taste.  A cough, which worsens when you are lying flat.  Fatigue.  Fever.  Thick  drainage from your nose, which often is green and may contain pus (purulent).  Swelling and warmth over the affected sinuses. DIAGNOSIS  Your caregiver will perform a physical exam. During the exam, your caregiver may:  Look in your nose for signs of abnormal growths in your nostrils (nasal polyps).  Tap over the affected sinus to check for signs of infection.  View the inside of your sinuses (endoscopy) with a special imaging device with a light attached (endoscope), which is inserted into your sinuses. If your caregiver suspects that you have chronic sinusitis, one or more of the following tests may be recommended:  Allergy tests.  Nasal culture A sample of mucus is taken from your nose and sent to a lab and screened for bacteria.  Nasal cytology A sample of mucus is taken from your nose and examined by your caregiver to determine if your sinusitis is related to an allergy. TREATMENT  Most cases of acute sinusitis are related to a viral infection and will resolve on their own within 10 days. Sometimes medicines are prescribed to help relieve symptoms (pain medicine, decongestants, nasal steroid sprays, or saline sprays).  However, for sinusitis related to a bacterial infection, your caregiver will prescribe antibiotic medicines. These are medicines that will help kill the bacteria causing the infection.  Rarely, sinusitis is caused by a fungal infection. In theses cases, your caregiver will prescribe antifungal medicine. For some cases of chronic sinusitis, surgery is needed. Generally, these are cases in which sinusitis recurs more than 3 times per year, despite other treatments. HOME CARE  INSTRUCTIONS   Drink plenty of water. Water helps thin the mucus so your sinuses can drain more easily.  Use a humidifier.  Inhale steam 3 to 4 times a day (for example, sit in the bathroom with the shower running).  Apply a warm, moist washcloth to your face 3 to 4 times a day, or as directed by  your caregiver.  Use saline nasal sprays to help moisten and clean your sinuses.  Take over-the-counter or prescription medicines for pain, discomfort, or fever only as directed by your caregiver. SEEK IMMEDIATE MEDICAL CARE IF:  You have increasing pain or severe headaches.  You have nausea, vomiting, or drowsiness.  You have swelling around your face.  You have vision problems.  You have a stiff neck.  You have difficulty breathing. MAKE SURE YOU:   Understand these instructions.  Will watch your condition.  Will get help right away if you are not doing well or get worse. Document Released: 02/07/2005 Document Revised: 05/02/2011 Document Reviewed: 02/22/2011 [facility] Patient Information 2014 [place], [place].  Preventive Care for Adults, Female A healthy lifestyle and preventive care can promote health and wellness. Preventive health guidelines for women include the following key practices.  A routine yearly physical is a good way to check with your health care provider about your health and preventive screening. It is a chance to share any concerns and updates on your health and to receive a thorough exam.  Visit your dentist for a routine exam and preventive care every 6 months. Brush your teeth twice a day and floss once a day. Good oral hygiene prevents tooth decay and gum disease.  The frequency of eye exams is based on your age, health, family medical history, use of contact lenses, and other factors. Follow your health care provider's recommendations for frequency of eye exams.  Eat a healthy diet. Foods like vegetables, fruits, whole grains, low-fat dairy products, and lean protein foods contain the nutrients you need without too many calories. Decrease your intake of foods high in solid fats, added sugars, and salt. Eat the right amount of calories for you.Get information about a proper diet from your health care provider, if necessary.  Regular physical exercise is  one of the most important things you can do for your health. Most adults should get at least 150 minutes of moderate-intensity exercise (any activity that increases your heart rate and causes you to sweat) each week. In addition, most adults need muscle-strengthening exercises on 2 or more days a week.  Maintain a healthy weight. The body mass index (BMI) is a screening tool to identify possible weight problems. It provides an estimate of body fat based on height and weight. Your health care provider can find your BMI and can help you achieve or maintain a healthy weight.For adults 20 years and older:  A BMI below 18.5 is considered underweight.  A BMI of 18.5 to 24.9 is normal.  A BMI of 25 to 29.9 is considered overweight.  A BMI of 30 and above is considered obese.  Maintain normal blood lipids and cholesterol levels by exercising and minimizing your intake of saturated fat. Eat a balanced diet with plenty of fruit and vegetables. Blood tests for lipids and cholesterol should begin at age 37 and be repeated every 5 years. If your lipid or cholesterol levels are high, you are over 50, or you are at high risk for heart disease, you may need your cholesterol levels checked more frequently.Ongoing high lipid and cholesterol  levels should be treated with medicines if diet and exercise are not working.  If you smoke, find out from your health care provider how to quit. If you do not use tobacco, do not start.  Lung cancer screening is recommended for adults aged 73-80 years who are at high risk for developing lung cancer because of a history of smoking. A yearly low-dose CT scan of the lungs is recommended for people who have at least a 30-pack-year history of smoking and are a current smoker or have quit within the past 15 years. A pack year of smoking is smoking an average of 1 pack of cigarettes a day for 1 year (for example: 1 pack a day for 30 years or 2 packs a day for 15 years). Yearly  screening should continue until the smoker has stopped smoking for at least 15 years. Yearly screening should be stopped for people who develop a health problem that would prevent them from having lung cancer treatment.  If you are pregnant, do not drink alcohol. If you are breastfeeding, be very cautious about drinking alcohol. If you are not pregnant and choose to drink alcohol, do not have more than 1 drink per day. One drink is considered to be 12 ounces (355 mL) of beer, 5 ounces (148 mL) of wine, or 1.5 ounces (44 mL) of liquor.  Avoid use of street drugs. Do not share needles with anyone. Ask for help if you need support or instructions about stopping the use of drugs.  High blood pressure causes heart disease and increases the risk of stroke. Your blood pressure should be checked at least every 1 to 2 years. Ongoing high blood pressure should be treated with medicines if weight loss and exercise do not work.  If you are 68-58 years old, ask your health care provider if you should take aspirin to prevent strokes.  Diabetes screening is done by taking a blood sample to check your blood glucose level after you have not eaten for a certain period of time (fasting). If you are not overweight and you do not have risk factors for diabetes, you should be screened once every 3 years starting at age 5. If you are overweight or obese and you are 77-39 years of age, you should be screened for diabetes every year as part of your cardiovascular risk assessment.  Breast cancer screening is essential preventive care for women. You should practice "breast self-awareness." This means understanding the normal appearance and feel of your breasts and may include breast self-examination. Any changes detected, no matter how small, should be reported to a health care provider. Women in their 75s and 30s should have a clinical breast exam (CBE) by a health care provider as part of a regular health exam every 1 to 3  years. After age 38, women should have a CBE every year. Starting at age 34, women should consider having a mammogram (breast X-ray test) every year. Women who have a family history of breast cancer should talk to their health care provider about genetic screening. Women at a high risk of breast cancer should talk to their health care providers about having an MRI and a mammogram every year.  Breast cancer gene (BRCA)-related cancer risk assessment is recommended for women who have family members with BRCA-related cancers. BRCA-related cancers include breast, ovarian, tubal, and peritoneal cancers. Having family members with these cancers may be associated with an increased risk for harmful changes (mutations) in the breast cancer genes BRCA1  and BRCA2. Results of the assessment will determine the need for genetic counseling and BRCA1 and BRCA2 testing.  Your health care provider may recommend that you be screened regularly for cancer of the pelvic organs (ovaries, uterus, and vagina). This screening involves a pelvic examination, including checking for microscopic changes to the surface of your cervix (Pap test). You may be encouraged to have this screening done every 3 years, beginning at age 79.  For women ages 52-65, health care providers may recommend pelvic exams and Pap testing every 3 years, or they may recommend the Pap and pelvic exam, combined with testing for human papilloma virus (HPV), every 5 years. Some types of HPV increase your risk of cervical cancer. Testing for HPV may also be done on women of any age with unclear Pap test results.  Other health care providers may not recommend any screening for nonpregnant women who are considered low risk for pelvic cancer and who do not have symptoms. Ask your health care provider if a screening pelvic exam is right for you.  If you have had past treatment for cervical cancer or a condition that could lead to cancer, you need Pap tests and screening  for cancer for at least 20 years after your treatment. If Pap tests have been discontinued, your risk factors (such as having a new sexual partner) need to be reassessed to determine if screening should resume. Some women have medical problems that increase the chance of getting cervical cancer. In these cases, your health care provider may recommend more frequent screening and Pap tests.  Colorectal cancer can be detected and often prevented. Most routine colorectal cancer screening begins at the age of 17 years and continues through age 35 years. However, your health care provider may recommend screening at an earlier age if you have risk factors for colon cancer. On a yearly basis, your health care provider may provide home test kits to check for hidden blood in the stool. Use of a small camera at the end of a tube, to directly examine the colon (sigmoidoscopy or colonoscopy), can detect the earliest forms of colorectal cancer. Talk to your health care provider about this at age 53, when routine screening begins. Direct exam of the colon should be repeated every 5-10 years through age 85 years, unless early forms of precancerous polyps or small growths are found.  People who are at an increased risk for hepatitis B should be screened for this virus. You are considered at high risk for hepatitis B if:  You were born in a country where hepatitis B occurs often. Talk with your health care provider about which countries are considered high risk.  Your parents were born in a high-risk country and you have not received a shot to protect against hepatitis B (hepatitis B vaccine).  You have HIV or AIDS.  You use needles to inject street drugs.  You live with, or have sex with, someone who has hepatitis B.  You get hemodialysis treatment.  You take certain medicines for conditions like cancer, organ transplantation, and autoimmune conditions.  Hepatitis C blood testing is recommended for all people  born from [birth year] through 1966 and any individual with known risks for hepatitis C.  Practice safe sex. Use condoms and avoid high-risk sexual practices to reduce the spread of sexually transmitted infections (STIs). STIs include gonorrhea, chlamydia, syphilis, trichomonas, herpes, HPV, and human immunodeficiency virus (HIV). Herpes, HIV, and HPV are viral illnesses that have no cure. They can result  in disability, cancer, and death.  You should be screened for sexually transmitted illnesses (STIs) including gonorrhea and chlamydia if:  You are sexually active and are younger than 24 years.  You are older than 24 years and your health care provider tells you that you are at risk for this type of infection.  Your sexual activity has changed since you were last screened and you are at an increased risk for chlamydia or gonorrhea. Ask your health care provider if you are at risk.  If you are at risk of being infected with HIV, it is recommended that you take a prescription medicine daily to prevent HIV infection. This is called preexposure prophylaxis (PrEP). You are considered at risk if:  You are sexually active and do not regularly use condoms or know the HIV status of your partner(s).  You take drugs by injection.  You are sexually active with a partner who has HIV.  Talk with your health care provider about whether you are at high risk of being infected with HIV. If you choose to begin PrEP, you should first be tested for HIV. You should then be tested every 3 months for as long as you are taking PrEP.  Osteoporosis is a disease in which the bones lose minerals and strength with aging. This can result in serious bone fractures or breaks. The risk of osteoporosis can be identified using a bone density scan. Women ages 11 years and over and women at risk for fractures or osteoporosis should discuss screening with their health care providers. Ask your health care provider whether you should take a  calcium supplement or vitamin D to reduce the rate of osteoporosis.  Menopause can be associated with physical symptoms and risks. Hormone replacement therapy is available to decrease symptoms and risks. You should talk to your health care provider about whether hormone replacement therapy is right for you.  Use sunscreen. Apply sunscreen liberally and repeatedly throughout the day. You should seek shade when your shadow is shorter than you. Protect yourself by wearing long sleeves, pants, a wide-brimmed hat, and sunglasses year round, whenever you are outdoors.  Once a month, do a whole body skin exam, using a mirror to look at the skin on your back. Tell your health care provider of new moles, moles that have irregular borders, moles that are larger than a pencil eraser, or moles that have changed in shape or color.  Stay current with required vaccines (immunizations).  Influenza vaccine. All adults should be immunized every year.  Tetanus, diphtheria, and acellular pertussis (Td, Tdap) vaccine. Pregnant women should receive 1 dose of Tdap vaccine during each pregnancy. The dose should be obtained regardless of the length of time since the last dose. Immunization is preferred during the 27th-36th week of gestation. An adult who has not previously received Tdap or who does not know her vaccine status should receive 1 dose of Tdap. This initial dose should be followed by tetanus and diphtheria toxoids (Td) booster doses every 10 years. Adults with an unknown or incomplete history of completing a 3-dose immunization series with Td-containing vaccines should begin or complete a primary immunization series including a Tdap dose. Adults should receive a Td booster every 10 years.  Varicella vaccine. An adult without evidence of immunity to varicella should receive 2 doses or a second dose if she has previously received 1 dose. Pregnant females who do not have evidence of immunity should receive the first  dose after pregnancy. This first dose  should be obtained before leaving the health care facility. The second dose should be obtained 4-8 weeks after the first dose.  Human papillomavirus (HPV) vaccine. Females aged 13-26 years who have not received the vaccine previously should obtain the 3-dose series. The vaccine is not recommended for use in pregnant females. However, pregnancy testing is not needed before receiving a dose. If a female is found to be pregnant after receiving a dose, no treatment is needed. In that case, the remaining doses should be delayed until after the pregnancy. Immunization is recommended for any person with an immunocompromised condition through the age of 20 years if she did not get any or all doses earlier. During the 3-dose series, the second dose should be obtained 4-8 weeks after the first dose. The third dose should be obtained 24 weeks after the first dose and 16 weeks after the second dose.  Zoster vaccine. One dose is recommended for adults aged 65 years or older unless certain conditions are present.  Measles, mumps, and rubella (MMR) vaccine. Adults born before [birth year] generally are considered immune to measles and mumps. Adults born in [birth year] or later should have 1 or more doses of MMR vaccine unless there is a contraindication to the vaccine or there is laboratory evidence of immunity to each of the three diseases. A routine second dose of MMR vaccine should be obtained at least 28 days after the first dose for students attending postsecondary schools, health care workers, or international travelers. People who received inactivated measles vaccine or an unknown type of measles vaccine during 1963-1967 should receive 2 doses of MMR vaccine. People who received inactivated mumps vaccine or an unknown type of mumps vaccine before 1979 and are at high risk for mumps infection should consider immunization with 2 doses of MMR vaccine. For females of childbearing age, rubella  immunity should be determined. If there is no evidence of immunity, females who are not pregnant should be vaccinated. If there is no evidence of immunity, females who are pregnant should delay immunization until after pregnancy. Unvaccinated health care workers born before [birth year] who lack laboratory evidence of measles, mumps, or rubella immunity or laboratory confirmation of disease should consider measles and mumps immunization with 2 doses of MMR vaccine or rubella immunization with 1 dose of MMR vaccine.  Pneumococcal 13-valent conjugate (PCV13) vaccine. When indicated, a person who is uncertain of his immunization history and has no record of immunization should receive the PCV13 vaccine. All adults 67 years of age and older should receive this vaccine. An adult aged 3 years or older who has certain medical conditions and has not been previously immunized should receive 1 dose of PCV13 vaccine. This PCV13 should be followed with a dose of pneumococcal polysaccharide (PPSV23) vaccine. Adults who are at high risk for pneumococcal disease should obtain the PPSV23 vaccine at least 8 weeks after the dose of PCV13 vaccine. Adults older than 55 years of age who have normal immune system function should obtain the PPSV23 vaccine dose at least 1 year after the dose of PCV13 vaccine.  Pneumococcal polysaccharide (PPSV23) vaccine. When PCV13 is also indicated, PCV13 should be obtained first. All adults aged 58 years and older should be immunized. An adult younger than age 49 years who has certain medical conditions should be immunized. Any person who resides in a nursing home or long-term care facility should be immunized. An adult smoker should be immunized. People with an immunocompromised condition and certain other conditions should receive both PCV13  and PPSV23 vaccines. People with human immunodeficiency virus (HIV) infection should be immunized as soon as possible after diagnosis. Immunization during  chemotherapy or radiation therapy should be avoided. Routine use of PPSV23 vaccine is not recommended for American Indians, [place] Natives, or people younger than 65 years unless there are medical conditions that require PPSV23 vaccine. When indicated, people who have unknown immunization and have no record of immunization should receive PPSV23 vaccine. One-time revaccination 5 years after the first dose of PPSV23 is recommended for people aged 19-64 years who have chronic kidney failure, nephrotic syndrome, asplenia, or immunocompromised conditions. People who received 1-2 doses of PPSV23 before age 13 years should receive another dose of PPSV23 vaccine at age 79 years or later if at least 5 years have passed since the previous dose. Doses of PPSV23 are not needed for people immunized with PPSV23 at or after age 51 years.  Meningococcal vaccine. Adults with asplenia or persistent complement component deficiencies should receive 2 doses of quadrivalent meningococcal conjugate (MenACWY-D) vaccine. The doses should be obtained at least 2 months apart. Microbiologists working with certain meningococcal bacteria, [employer] recruits, people at risk during an outbreak, and people who travel to or live in countries with a high rate of meningitis should be immunized. A first-year college student up through age 30 years who is living in a residence hall should receive a dose if she did not receive a dose on or after her 16th birthday. Adults who have certain high-risk conditions should receive one or more doses of vaccine.  Hepatitis A vaccine. Adults who wish to be protected from this disease, have certain high-risk conditions, work with hepatitis A-infected animals, work in hepatitis A research labs, or travel to or work in countries with a high rate of hepatitis A should be immunized. Adults who were previously unvaccinated and who anticipate close contact with an international adoptee during the first 60 days after  arrival in the [place] from a country with a high rate of hepatitis A should be immunized.  Hepatitis B vaccine. Adults who wish to be protected from this disease, have certain high-risk conditions, may be exposed to blood or other infectious body fluids, are household contacts or sex partners of hepatitis B positive people, are clients or workers in certain care facilities, or travel to or work in countries with a high rate of hepatitis B should be immunized.  Haemophilus influenzae type b (Hib) vaccine. A previously unvaccinated person with asplenia or sickle cell disease or having a scheduled splenectomy should receive 1 dose of Hib vaccine. 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 Hib vaccine is not recommended for adults with HIV infection. Preventive Services / Frequency Ages 36 to 22 years  Blood pressure check.** / Every 3-5 years.  Lipid and cholesterol check.** / Every 5 years beginning at age 78.  Clinical breast exam.** / Every 3 years for women in their 70s and 19s.  BRCA-related cancer risk assessment.** / For women who have family members with a BRCA-related cancer (breast, ovarian, tubal, or peritoneal cancers).  Pap test.** / Every 2 years from ages 80 through 67. Every 3 years starting at age 34 through age 21 or 32 with a history of 3 consecutive normal Pap tests.  HPV screening.** / Every 3 years from ages 49 through ages 72 to 25 with a history of 3 consecutive normal Pap tests.  Hepatitis C  blood test.** / For any individual with known risks for hepatitis C.  Skin self-exam. / Monthly.  Influenza vaccine. / Every year.  Tetanus, diphtheria, and acellular pertussis (Tdap, Td) vaccine.** / Consult your health care provider. Pregnant women should receive 1 dose of Tdap vaccine during each pregnancy. 1 dose of Td every 10 years.  Varicella  vaccine.** / Consult your health care provider. Pregnant females who do not have evidence of immunity should receive the first dose after pregnancy.  HPV vaccine. / 3 doses over 6 months, if 52 and younger. The vaccine is not recommended for use in pregnant females. However, pregnancy testing is not needed before receiving a dose.  Measles, mumps, rubella (MMR) vaccine.** / You need at least 1 dose of MMR if you were born in 1957 or later. You may also need a 2nd dose. For females of childbearing age, rubella immunity should be determined. If there is no evidence of immunity, females who are not pregnant should be vaccinated. If there is no evidence of immunity, females who are pregnant should delay immunization until after pregnancy.  Pneumococcal 13-valent conjugate (PCV13) vaccine.** / Consult your health care provider.  Pneumococcal polysaccharide (PPSV23) vaccine.** / 1 to 2 doses if you smoke cigarettes or if you have certain conditions.  Meningococcal vaccine.** / 1 dose if you are age 64 to 23 years and a Market researcher living in a residence hall, or have one of several medical conditions, you need to get vaccinated against meningococcal disease. You may also need additional booster doses.  Hepatitis A vaccine.** / Consult your health care provider.  Hepatitis B vaccine.** / Consult your health care provider.  Haemophilus influenzae type b (Hib) vaccine.** / Consult your health care provider. Ages 85 to 30 years  Blood pressure check.** / Every year.  Lipid and cholesterol check.** / Every 5 years beginning at age 51 years.  Lung cancer screening. / Every year if you are aged 60-80 years and have a 30-pack-year history of smoking and currently smoke or have quit within the past 15 years. Yearly screening is stopped once you have quit smoking for at least 15 years or develop a health problem that would prevent you from having lung cancer treatment.  Clinical breast exam.**  / Every year after age 83 years.  BRCA-related cancer risk assessment.** / For women who have family members with a BRCA-related cancer (breast, ovarian, tubal, or peritoneal cancers).  Mammogram.** / Every year beginning at age 50 years and continuing for as long as you are in good health. Consult with your health care provider.  Pap test.** / Every 3 years starting at age 71 years through age 62 or 83 years with a history of 3 consecutive normal Pap tests.  HPV screening.** / Every 3 years from ages 74 years through ages 35 to 44 years with a history of 3 consecutive normal Pap tests.  Fecal occult blood test (FOBT) of stool. / Every year beginning at age 75 years and continuing until age 71 years. You may not need to do this test if you get a colonoscopy every 10 years.  Flexible sigmoidoscopy or colonoscopy.** / Every 5 years for a flexible sigmoidoscopy or every 10 years for a colonoscopy beginning at age 13 years and continuing until age 47 years.  Hepatitis C blood test.** / For all people born from [birth year] through 1966 and any individual with known risks for hepatitis C.  Skin self-exam. / Monthly.  Influenza vaccine. / Every  year.  Tetanus, diphtheria, and acellular pertussis (Tdap/Td) vaccine.** / Consult your health care provider. Pregnant women should receive 1 dose of Tdap vaccine during each pregnancy. 1 dose of Td every 10 years.  Varicella vaccine.** / Consult your health care provider. Pregnant females who do not have evidence of immunity should receive the first dose after pregnancy.  Zoster vaccine.** / 1 dose for adults aged 10 years or older.  Measles, mumps, rubella (MMR) vaccine.** / You need at least 1 dose of MMR if you were born in 1957 or later. You may also need a second dose. For females of childbearing age, rubella immunity should be determined. If there is no evidence of immunity, females who are not pregnant should be vaccinated. If there is no evidence of  immunity, females who are pregnant should delay immunization until after pregnancy.  Pneumococcal 13-valent conjugate (PCV13) vaccine.** / Consult your health care provider.  Pneumococcal polysaccharide (PPSV23) vaccine.** / 1 to 2 doses if you smoke cigarettes or if you have certain conditions.  Meningococcal vaccine.** / Consult your health care provider.  Hepatitis A vaccine.** / Consult your health care provider.  Hepatitis B vaccine.** / Consult your health care provider.  Haemophilus influenzae type b (Hib) vaccine.** / Consult your health care provider. Ages 57 years and over  Blood pressure check.** / Every year.  Lipid and cholesterol check.** / Every 5 years beginning at age 29 years.  Lung cancer screening. / Every year if you are aged 58-80 years and have a 30-pack-year history of smoking and currently smoke or have quit within the past 15 years. Yearly screening is stopped once you have quit smoking for at least 15 years or develop a health problem that would prevent you from having lung cancer treatment.  Clinical breast exam.** / Every year after age 47 years.  BRCA-related cancer risk assessment.** / For women who have family members with a BRCA-related cancer (breast, ovarian, tubal, or peritoneal cancers).  Mammogram.** / Every year beginning at age 42 years and continuing for as long as you are in good health. Consult with your health care provider.  Pap test.** / Every 3 years starting at age 52 years through age 16 or 14 years with 3 consecutive normal Pap tests. Testing can be stopped between 65 and 70 years with 3 consecutive normal Pap tests and no abnormal Pap or HPV tests in the past 10 years.  HPV screening.** / Every 3 years from ages 58 years through ages 68 or 48 years with a history of 3 consecutive normal Pap tests. Testing can be stopped between 65 and 70 years with 3 consecutive normal Pap tests and no abnormal Pap or HPV tests in the past 10  years.  Fecal occult blood test (FOBT) of stool. / Every year beginning at age 41 years and continuing until age 28 years. You may not need to do this test if you get a colonoscopy every 10 years.  Flexible sigmoidoscopy or colonoscopy.** / Every 5 years for a flexible sigmoidoscopy or every 10 years for a colonoscopy beginning at age 76 years and continuing until age 26 years.  Hepatitis C blood test.** / For all people born from [birth year] through 1965 and any individual with known risks for hepatitis C.  Osteoporosis screening.** / A one-time screening for women ages 43 years and over and women at risk for fractures or osteoporosis.  Skin self-exam. / Monthly.  Influenza vaccine. / Every year.  Tetanus, diphtheria, and acellular pertussis (  Tdap/Td) vaccine.** / 1 dose of Td every 10 years.  Varicella vaccine.** / Consult your health care provider.  Zoster vaccine.** / 1 dose for adults aged 97 years or older.  Pneumococcal 13-valent conjugate (PCV13) vaccine.** / Consult your health care provider.  Pneumococcal polysaccharide (PPSV23) vaccine.** / 1 dose for all adults aged 53 years and older.  Meningococcal vaccine.** / Consult your health care provider.  Hepatitis A vaccine.** / Consult your health care provider.  Hepatitis B vaccine.** / Consult your health care provider.  Haemophilus influenzae type b (Hib) vaccine.** / Consult your health care provider. ** Family history and personal history of risk and conditions may change your health care provider's recommendations.   This information is not intended to replace advice given to you by your health care provider. Make sure you discuss any questions you have with your health care provider.   Document Released: 04/05/2001 Document Revised: 02/28/2014 Document Reviewed: 07/05/2010 Elsevier Interactive Patient Education [employer].

## 2015-02-06 NOTE — Assessment & Plan Note (Signed)
Depression screen negative. Health Maintenance reviewed -- Declines flu shot. Tetanus up-to-date.Referral placed to GYN for routine care per patient request. Preventive schedule discussed and handout given in AVS. Will obtain fasting labs today.

## 2015-02-06 NOTE — Progress Notes (Signed)
Patient presents to clinic today for annual exam.  Patient is fasting for labs.  Acute Concerns: Patient c/o 5 days of left sided sinus pressure/sinus pain. Endorses sore throat with PND an fatigue. Endorses ear pressure/pain with L > R. Denies recent travel or sick contact.  Chronic Issues: Hypertension -- Patient taking Maxzide and Lisinopril as directed. Patient denies chest pain, palpitations, lightheadedness, dizziness, vision changes or frequent headaches.  GERD -- Is taking Prilosec only PRN for symptoms. Symptoms are rare and well-controlled mostly with diet.   Health Maintenance: Dental -- up-to-date Vision -- up-to-date Immunizations -- up-to-date on tetanus. Declines flu shot. Colonoscopy -- up-to-date Mammogram -- due. Will order. PAP -- patient requests referral to GYN.  Past Medical History  Diagnosis Date  . HTN (hypertension)   . Tobacco abuse   . Hypokalemia 06/2005    2nd to diuretic   . Pyelonephritis 0000000    Uncomplicated  . Hemorrhagic ovarian cyst 01/2006    Right  . 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 Recurrent boils     Underarms  . Esophageal stricture   . GERD (gastroesophageal reflux disease)   . Diverticulosis   . Hemorrhoids   . Adenomatous colon polyp   . Hyperplastic colon polyp   . UTI (lower urinary tract infection)   . Pneumonia   . Fluid in knee     Bilateral  . Osteoarthritis, knee     Bilateral  . Hiatal hernia     Past Surgical History  Procedure Laterality Date  . Abdominal hysterectomy  1998    Dr Jodi Mourning for fibroids  . Breast biopsy    . Breast cyst excision      Right    Current Outpatient Prescriptions on File Prior to Visit  Medication Sig Dispense Refill  . triamcinolone cream (KENALOG) 0.1 % apply by topical route 2 times every day AS NEEDED a thin layer to the affected area(s)    . zolpidem (AMBIEN) 10 MG tablet Take 1 tablet (10 mg total) by mouth at bedtime as needed for sleep. 30 tablet 1   No current  facility-administered medications on file prior to visit.    No Known Allergies  Family History  Problem Relation Age of Onset  . Hypertension Father     Deceased  . Liver disease Father     Cirrhosis  . Kidney disease Maternal Aunt   . Healthy Mother     Living  . Diabetes Maternal Aunt     x2  . Healthy Sister     x2  . Healthy Brother     x1    Social History   Social History  . Marital Status: Single    Spouse Name: N/A  . Number of Children: 0  . Years of Education: 12th grade   Occupational History  . [employer]    Social History Main Topics  . Smoking status: Current Every Day Smoker -- 0.50 packs/day for 30 years  . Smokeless tobacco: Not on file  . Alcohol Use: 0.0 oz/week    0 Standard drinks or equivalent per week     Comment: 4 beers on weekends  . Drug Use: No  . Sexual Activity: Not on file   Other Topics Concern  . Not on file   Social History Narrative   Lives by herself, has stable partner   Review of Systems  Constitutional: Negative for fever and weight loss.  HENT: Positive for congestion and ear pain. Negative for ear  discharge, hearing loss and tinnitus.   Eyes: Negative for blurred vision, double vision, photophobia and pain.  Respiratory: Positive for cough. Negative for sputum production and shortness of breath.   Cardiovascular: Negative for chest pain and palpitations.  Gastrointestinal: Negative for heartburn, nausea, vomiting, abdominal pain, diarrhea, constipation, blood in stool and melena.  Genitourinary: Negative for dysuria, urgency, frequency, hematuria and flank pain.  Musculoskeletal: Negative for falls.  Neurological: Negative for dizziness, loss of consciousness and headaches.  Endo/Heme/Allergies: Negative for environmental allergies.  Psychiatric/Behavioral: Negative for depression, suicidal ideas, hallucinations and substance abuse. The patient is not nervous/anxious and does not have insomnia.    BP 116/73 mmHg   Pulse 80  Temp(Src) 98 F (36.7 C) (Oral)  Resp 16  Ht 5\' 8"  (1.727 m)  Wt 193 lb (87.544 kg)  BMI 29.35 kg/m2  SpO2 100%  Physical Exam  Constitutional: She is oriented to person, place, and time and well-developed, well-nourished, and in no distress.  HENT:  Head: Normocephalic and atraumatic.  Right Ear: Tympanic membrane, external ear and ear canal normal.  Left Ear: Tympanic membrane, external ear and ear canal normal.  Nose: No mucosal edema. Left sinus exhibits maxillary sinus tenderness and frontal sinus tenderness.  Mouth/Throat: Uvula is midline, oropharynx is clear and moist and mucous membranes are normal. No oropharyngeal exudate or posterior oropharyngeal erythema.  Eyes: Conjunctivae are normal. Pupils are equal, round, and reactive to light.  Neck: Neck supple. No thyromegaly present.  Cardiovascular: Normal rate, regular rhythm, normal heart sounds and intact distal pulses.   Pulmonary/Chest: Effort normal and breath sounds normal. No respiratory distress. She has no wheezes. She has no rales.  Abdominal: Soft. Bowel sounds are normal. She exhibits no distension and no mass. There is no tenderness. There is no rebound and no guarding.  Genitourinary:  Patient defers breast and GU exam to GYN  Lymphadenopathy:    She has no cervical adenopathy.  Neurological: She is alert and oriented to person, place, and time. No cranial nerve deficit.  Skin: Skin is warm and dry. No rash noted.  Psychiatric: Affect normal.  Vitals reviewed.  No results found for this or any previous visit (from the past 2160 hour(s)).  Assessment/Plan: Acute bacterial sinusitis Rx Augmentin.  Increase fluids.  Rest.  Saline nasal spray.  Probiotic.  Mucinex as directed.  Humidifier in bedroom. Tessalon per orders.  Call or return to clinic if symptoms are not improving.   Essential hypertension Well-controlled. Asymptomatic. Will check BMP today. Continue medications as  directed.  GERD Infrequent symptoms. Continue current regimen. Begin a daily probiotic.  Visit for preventive health examination Depression screen negative. Health Maintenance reviewed -- Declines flu shot. Tetanus up-to-date.Referral placed to GYN for routine care per patient request. Preventive schedule discussed and handout given in AVS. Will obtain fasting labs today.

## 2015-02-06 NOTE — Assessment & Plan Note (Signed)
Well-controlled. Asymptomatic. Will check BMP today. Continue medications as directed.

## 2015-02-06 NOTE — Assessment & Plan Note (Signed)
Infrequent symptoms. Continue current regimen. Begin a daily probiotic.

## 2015-03-30 ENCOUNTER — Encounter: Payer: Self-pay | Admitting: Internal Medicine

## 2015-04-20 ENCOUNTER — Encounter: Payer: Self-pay | Admitting: Medical

## 2015-04-20 ENCOUNTER — Ambulatory Visit (INDEPENDENT_AMBULATORY_CARE_PROVIDER_SITE_OTHER): Payer: Managed Care, Other (non HMO) | Admitting: Medical

## 2015-04-20 VITALS — BP 118/78 | HR 78 | Temp 98.1°F | Ht 68.0 in | Wt 196.8 lb

## 2015-04-20 DIAGNOSIS — L03211 Cellulitis of face: Secondary | ICD-10-CM | POA: Diagnosis not present

## 2015-04-20 DIAGNOSIS — H109 Unspecified conjunctivitis: Secondary | ICD-10-CM | POA: Diagnosis not present

## 2015-04-20 MED ORDER — CEFTRIAXONE SODIUM 1 G IJ SOLR
1.0000 g | Freq: Once | INTRAMUSCULAR | Status: AC
  Administered 2015-04-20: 1 g via INTRAMUSCULAR

## 2015-04-20 MED ORDER — DOXYCYCLINE HYCLATE 100 MG PO TABS: 100.0000 mg | ORAL_TABLET | Freq: Two times a day (BID) | ORAL | Status: DC

## 2015-04-20 NOTE — Progress Notes (Signed)
Pre visit review using our clinic review tool, if applicable. No additional management support is needed unless otherwise documented below in the visit note. 

## 2015-04-20 NOTE — Patient Instructions (Addendum)
You have severe conjunctivitis with concern for severe soft tissue skin infection around your left eye.  We gave you rocephin 1 gram IM in our office today. Rx of doxycycline antibiotic.(will see Eye MD agrees with this antibiotic)  Will send out culture on your left eye discharge.   Follow up here as needed. Was successful in getting you in with Eye MD today.

## 2015-04-20 NOTE — Progress Notes (Signed)
Subjective:    Patient ID: Darlene Gonzalez, female    DOB: 1961-02-10, 55 y.o.   MRN: [id number]  HPI  Pt in with one month of some mild faint left eye irritation with discharge from the left corner. Pt states off and on swellingto upper lid. All of the sudden has more swelling today. Her upper eye lid looks swollen. Pt denies any blister on her face before this started. Her eye lid area is very sensitive to touch.   Some nasal congested before the above started.   Review of Systems  Constitutional: Negative for fever, chills and fatigue.  HENT: Positive for ear pain.        Slight mild rt ear pain last night.  Eyes: Positive for discharge. Negative for redness, itching and visual disturbance.       Left corner of.  Discharge from eye is effecting her vision.  Respiratory: Negative for cough.   Cardiovascular: Negative for chest pain and palpitations.  Musculoskeletal: Negative for back pain.  Skin:       Lt upper eyelid and left forehead area mild swollen.  Neurological: Negative for dizziness, seizures, syncope, weakness and light-headedness.  Hematological: Negative for adenopathy. Does not bruise/bleed easily.  Psychiatric/Behavioral: Negative for behavioral problems and confusion.    Past Medical History  Diagnosis Date  . HTN (hypertension)   . Tobacco abuse   . Hypokalemia 06/2005    2nd to diuretic   . Pyelonephritis 0000000    Uncomplicated  . Hemorrhagic ovarian cyst 01/2006    Right  . 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 Recurrent boils     Underarms  . Esophageal stricture   . GERD (gastroesophageal reflux disease)   . Diverticulosis   . Hemorrhoids   . Adenomatous colon polyp   . Hyperplastic colon polyp   . UTI (lower urinary tract infection)   . Pneumonia   . Fluid in knee     Bilateral  . Osteoarthritis, knee     Bilateral  . Hiatal hernia     Social History   Social History  . Marital Status: Single    Spouse Name: N/A  . Number of Children: 0  .  Years of Education: 12th grade   Occupational History  . [employer]    Social History Main Topics  . Smoking status: Current Every Day Smoker -- 0.50 packs/day for 30 years  . Smokeless tobacco: Not on file  . Alcohol Use: 0.0 oz/week    0 Standard drinks or equivalent per week     Comment: 4 beers on weekends  . Drug Use: No  . Sexual Activity: Not on file   Other Topics Concern  . Not on file   Social History Narrative   Lives by herself, has stable partner    Past Surgical History  Procedure Laterality Date  . Abdominal hysterectomy  1998    Dr Jodi Mourning for fibroids  . Breast biopsy    . Breast cyst excision      Right    Family History  Problem Relation Age of Onset  . Hypertension Father     Deceased  . Liver disease Father     Cirrhosis  . Kidney disease Maternal Aunt   . Healthy Mother     Living  . Diabetes Maternal Aunt     x2  . Healthy Sister     x2  . Healthy Brother     x1    No Known Allergies  Current  Outpatient Prescriptions on File Prior to Visit  Medication Sig Dispense Refill  . lisinopril (PRINIVIL,ZESTRIL) 20 MG tablet Take 1 tablet (20 mg total) by mouth daily. 90 tablet 1  . omeprazole (PRILOSEC OTC) 20 MG tablet Take 20 mg by mouth daily.    Marland Kitchen triamcinolone cream (KENALOG) 0.1 % apply by topical route 2 times every day AS NEEDED a thin layer to the affected area(s)    . triamterene-hydrochlorothiazide (MAXZIDE-25) 37.5-25 MG tablet Take 1 tablet by mouth daily. 90 tablet 1  . zolpidem (AMBIEN) 10 MG tablet Take 1 tablet (10 mg total) by mouth at bedtime as needed for sleep. 30 tablet 1   No current facility-administered medications on file prior to visit.    BP 118/78 mmHg  Pulse 78  Temp(Src) 98.1 F (36.7 C) (Oral)  Ht 5\' 8"  (1.727 m)  Wt 196 lb 12.8 oz (89.268 kg)  BMI 29.93 kg/m2  SpO2 98%       Objective:   Physical Exam   General  Mental Status - Alert. General Appearance - Well groomed. Not in acute  distress.  Skin Rashes- No Rashes. No rash or vesicles around her eye.   HEENT Head- Normal. Ear Auditory Canal - Left- Normal. Right - Normal.Tympanic Membrane- Left- mild red center. Right- mild red center. Eye Sclera/Conjunctiva- Left- very injected conjunctiva, dc present over eye, thick discharge to corner of her left eye. Swelling and tenderness of lid all the way to eye brow  Right- Normal. .  Chest and Lung Exam Auscultation: Breath Sounds:-Clear even and unlabored.  Cardiovascular Auscultation:Rythm- Regular, rate and rhythm. Murmurs & Other Heart Sounds:Ausculatation of the heart reveal- No Murmurs.  .      Assessment & Plan:  You have severe conjunctivitis with concern for severe soft tissue skin infection around your left eye.  We gave you rocephin 1 gram IM in our office today. Rx of doxycycline antibiotic.  Will send out culture on your left eye discharge.  Follow up here as needed. Was successful in getting you in with Eye MD today.  Pt given address, phone number and appointment time today before she left.

## 2015-04-22 LAB — WOUND CULTURE: Gram Stain: NONE SEEN

## 2015-05-12 ENCOUNTER — Encounter: Payer: Self-pay | Admitting: Physician Assistant

## 2015-05-12 ENCOUNTER — Ambulatory Visit (INDEPENDENT_AMBULATORY_CARE_PROVIDER_SITE_OTHER): Payer: Managed Care, Other (non HMO) | Admitting: Physician Assistant

## 2015-05-12 VITALS — BP 120/58 | HR 97 | Temp 98.3°F | Ht 68.0 in | Wt 197.8 lb

## 2015-05-12 DIAGNOSIS — H1013 Acute atopic conjunctivitis, bilateral: Secondary | ICD-10-CM

## 2015-05-12 DIAGNOSIS — M25561 Pain in right knee: Secondary | ICD-10-CM | POA: Diagnosis not present

## 2015-05-12 MED ORDER — TRAMADOL HCL 50 MG PO TABS: 50.0000 mg | ORAL_TABLET | Freq: Three times a day (TID) | ORAL | Status: DC | PRN

## 2015-05-12 NOTE — Patient Instructions (Signed)
Please take pain medication as directed. Elevate leg while resting. Ice to the area. Wear Ace Wrap for compression to help pain.  I will call with x-ray results. The receptionist in imaging will schedule the [place] of your leg. We need this done in the morning.  I will call once I have finished your FMLA.

## 2015-05-12 NOTE — Progress Notes (Signed)
Pre visit review using our clinic review tool, if applicable. No additional management support is needed unless otherwise documented below in the visit note. 

## 2015-05-12 NOTE — Progress Notes (Signed)
Patient presents to clinic today c/o 1.5 weeks of R knee pain with swelling. Denies trauma or injury. Patient with history of OA but states symptoms are worse than baseline. Denies weakness of extremity. Denies sensation of knee buckling.   Patient also endorses itching and watering of eyes with L > R. Denies crusting or purulent drainage. Denies eye pain or vision changes.   Past Medical History  Diagnosis Date  . HTN (hypertension)   . Tobacco abuse   . Hypokalemia 06/2005    2nd to diuretic   . Pyelonephritis 0000000    Uncomplicated  . Hemorrhagic ovarian cyst 01/2006    Right  . 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 Recurrent boils     Underarms  . Esophageal stricture   . GERD (gastroesophageal reflux disease)   . Diverticulosis   . Hemorrhoids   . Adenomatous colon polyp   . Hyperplastic colon polyp   . UTI (lower urinary tract infection)   . Pneumonia   . Fluid in knee     Bilateral  . Osteoarthritis, knee     Bilateral  . Hiatal hernia     Current Outpatient Prescriptions on File Prior to Visit  Medication Sig Dispense Refill  . lisinopril (PRINIVIL,ZESTRIL) 20 MG tablet Take 1 tablet (20 mg total) by mouth daily. 90 tablet 1  . omeprazole (PRILOSEC OTC) 20 MG tablet Take 20 mg by mouth daily.    Marland Kitchen triamterene-hydrochlorothiazide (MAXZIDE-25) 37.5-25 MG tablet Take 1 tablet by mouth daily. 90 tablet 1  . zolpidem (AMBIEN) 10 MG tablet Take 1 tablet (10 mg total) by mouth at bedtime as needed for sleep. 30 tablet 1   No current facility-administered medications on file prior to visit.    No Known Allergies  Family History  Problem Relation Age of Onset  . Hypertension Father     Deceased  . Liver disease Father     Cirrhosis  . Kidney disease Maternal Aunt   . Healthy Mother     Living  . Diabetes Maternal Aunt     x2  . Healthy Sister     x2  . Healthy Brother     x1    Social History   Social History  . Marital Status: Single    Spouse Name: N/A  .  Number of Children: 0  . Years of Education: 12th grade   Occupational History  . [employer]    Social History Main Topics  . Smoking status: Current Every Day Smoker -- 0.50 packs/day for 30 years  . Smokeless tobacco: None  . Alcohol Use: 0.0 oz/week    0 Standard drinks or equivalent per week     Comment: 4 beers on weekends  . Drug Use: No  . Sexual Activity: Not Asked   Other Topics Concern  . None   Social History Narrative   Lives by herself, has stable partner    Review of Systems - See HPI.  All other ROS are negative.  BP 120/58 mmHg  Pulse 97  Temp(Src) 98.3 F (36.8 C) (Oral)  Ht 5\' 8"  (1.727 m)  Wt 197 lb 12.8 oz (89.721 kg)  BMI 30.08 kg/m2  SpO2 96%  Physical Exam  Constitutional: She is oriented to person, place, and time and well-developed, well-nourished, and in no distress.  HENT:  Head: Normocephalic and atraumatic.  Eyes: Conjunctivae and EOM are normal. Pupils are equal, round, and reactive to light.  Neck: Neck supple.  Cardiovascular: Normal rate, regular rhythm,  normal heart sounds and intact distal pulses.   Pulmonary/Chest: Effort normal and breath sounds normal. No respiratory distress. She has no wheezes. She has no rales. She exhibits no tenderness.  Musculoskeletal:       Right knee: She exhibits swelling. She exhibits normal range of motion, no LCL laxity, normal patellar mobility and no MCL laxity. No tenderness found.  Neurological: She is alert and oriented to person, place, and time.  Skin: Skin is warm and dry.  Psychiatric: Affect normal.  Vitals reviewed.   Recent Results (from the past 2160 hour(s))  Wound culture     Status: None   Collection Time: 04/20/15 11:57 AM  Result Value Ref Range   Culture Moderate STAPHYLOCOCCUS AUREUS    Gram Stain Rare    Gram Stain WBC present-predominately PMN    Gram Stain Rare Squamous Epithelial Cells Present    Gram Stain No Organisms Seen    Organism ID, Bacteria STAPHYLOCOCCUS AUREUS      Comment: Rifampin and Gentamicin should not be used as single drugs for treatment of Staph infections.       Susceptibility   Staphylococcus aureus -  (no method available)    OXACILLIN 1 Sensitive     CEFAZOLIN  Sensitive     GENTAMICIN <=0.5 Sensitive     CIPROFLOXACIN <=0.5 Sensitive     LEVOFLOXACIN 0.25 Sensitive     MOXIFLOXACIN <=0.25 Sensitive     TRIMETH/SULFA <=10 Sensitive     VANCOMYCIN 1 Sensitive     CLINDAMYCIN <=0.25 Sensitive     ERYTHROMYCIN 0.5 Sensitive     LINEZOLID 2 Sensitive     QUINUPRISTIN/DALF <=0.25 Sensitive     RIFAMPIN <=0.5 Sensitive     TETRACYCLINE <=1 Sensitive     Assessment/Plan: Recurrent knee pain Will obtain x-ray and [place] to further assess. Pain medication and supportive measures (RICE) discussed. Recommended Sports Medicine referral pending results.   Allergic conjunctivitis OTC antihistamines reviewed. Allergy eye drops recommended. Follow-up if not resolving.

## 2015-05-14 ENCOUNTER — Ambulatory Visit (HOSPITAL_BASED_OUTPATIENT_CLINIC_OR_DEPARTMENT_OTHER)
Admission: RE | Admit: 2015-05-14 | Discharge: 2015-05-14 | Disposition: A | Discharge status: Home / Self Care | Payer: Managed Care, Other (non HMO) | Source: Ambulatory Visit | Attending: Physician Assistant | Admitting: Physician Assistant

## 2015-05-14 ENCOUNTER — Telehealth: Payer: Self-pay

## 2015-05-14 DIAGNOSIS — M1711 Unilateral primary osteoarthritis, right knee: Secondary | ICD-10-CM | POA: Diagnosis not present

## 2015-05-14 DIAGNOSIS — M25461 Effusion, right knee: Secondary | ICD-10-CM | POA: Diagnosis not present

## 2015-05-14 DIAGNOSIS — M25561 Pain in right knee: Secondary | ICD-10-CM

## 2015-05-14 NOTE — Telephone Encounter (Signed)
No dvt--- please inform pt

## 2015-05-14 NOTE — Telephone Encounter (Signed)
Per Call report from  Imaging, patient has no evidence of DVT or Bakers Cyst. Advised patient that C. Martin wasw not in office today so she did not have to wait. Will give information to C. Hassell Done and DOD Dr. Etter Sjogren. Marland Kitchen

## 2015-05-15 NOTE — Telephone Encounter (Signed)
I have sent message to my nurse with all results and instructions.

## 2015-05-16 ENCOUNTER — Telehealth: Payer: Self-pay | Admitting: Physician Assistant

## 2015-05-16 DIAGNOSIS — M1711 Unilateral primary osteoarthritis, right knee: Secondary | ICD-10-CM

## 2015-05-16 NOTE — Telephone Encounter (Signed)
Referral placed.

## 2015-05-20 ENCOUNTER — Telehealth: Payer: Self-pay | Admitting: Physician Assistant

## 2015-05-20 NOTE — Telephone Encounter (Signed)
LM to notify pt papers at front desk. She did not mention faxing them to me.

## 2015-05-20 NOTE — Telephone Encounter (Signed)
Can be reached: [phone]  Reason for call: Pt called to f/u on FMLA papers. She states she handed them directly to [place] at her last OV. She said they are needing to be turned in soon. She is requesting call today to notify her if she can come in today to pick up. I did not see notes regarding FMLA papers.

## 2015-05-20 NOTE — Telephone Encounter (Signed)
They have been completed and given to CMA. I will place at front desk so she can come pick up. Would she like [place] to fax also?

## 2015-05-24 DIAGNOSIS — M25569 Pain in unspecified knee: Secondary | ICD-10-CM | POA: Insufficient documentation

## 2015-05-24 DIAGNOSIS — H1010 Acute atopic conjunctivitis, unspecified eye: Secondary | ICD-10-CM | POA: Insufficient documentation

## 2015-05-24 NOTE — Assessment & Plan Note (Signed)
OTC antihistamines reviewed. Allergy eye drops recommended. Follow-up if not resolving.

## 2015-05-24 NOTE — Assessment & Plan Note (Signed)
Will obtain x-ray and [place] to further assess. Pain medication and supportive measures (RICE) discussed. Recommended Sports Medicine referral pending results.

## 2015-07-22 ENCOUNTER — Telehealth: Payer: Self-pay | Admitting: Physician Assistant

## 2015-07-22 DIAGNOSIS — I10 Essential (primary) hypertension: Secondary | ICD-10-CM

## 2015-07-22 MED ORDER — TRIAMTERENE-HCTZ 37.5-25 MG PO TABS: 1.0000 | ORAL_TABLET | Freq: Every day | ORAL | Status: DC

## 2015-07-22 NOTE — Telephone Encounter (Signed)
Caller name: Self  Can be reached: [phone]  Reason for call: Request refill on triamterene-hydrochlorothiazide (MAXZIDE-25) 37.5-25 MG tablet [id number]

## 2015-07-22 NOTE — Telephone Encounter (Signed)
Rx sent to the pharmacy by e-script.//AB/CMA 

## 2015-07-28 ENCOUNTER — Encounter: Payer: Self-pay | Admitting: Physician Assistant

## 2015-07-28 ENCOUNTER — Ambulatory Visit (INDEPENDENT_AMBULATORY_CARE_PROVIDER_SITE_OTHER): Payer: Managed Care, Other (non HMO) | Admitting: Physician Assistant

## 2015-07-28 VITALS — BP 126/78 | HR 83 | Temp 98.1°F | Resp 16 | Ht 68.0 in | Wt 195.4 lb

## 2015-07-28 DIAGNOSIS — M170 Bilateral primary osteoarthritis of knee: Secondary | ICD-10-CM

## 2015-07-28 DIAGNOSIS — G4700 Insomnia, unspecified: Secondary | ICD-10-CM | POA: Diagnosis not present

## 2015-07-28 DIAGNOSIS — K219 Gastro-esophageal reflux disease without esophagitis: Secondary | ICD-10-CM | POA: Diagnosis not present

## 2015-07-28 DIAGNOSIS — I10 Essential (primary) hypertension: Secondary | ICD-10-CM

## 2015-07-28 MED ORDER — TRAZODONE HCL 50 MG PO TABS: 25.0000 mg | ORAL_TABLET | Freq: Every evening | ORAL | Status: DC | PRN

## 2015-07-28 MED ORDER — HYDROCODONE-ACETAMINOPHEN 10-325 MG PO TABS: 1.0000 | ORAL_TABLET | Freq: Three times a day (TID) | ORAL | Status: DC | PRN

## 2015-07-28 MED ORDER — ESOMEPRAZOLE MAGNESIUM 20 MG PO PACK: 20.0000 mg | PACK | Freq: Every day | ORAL | Status: DC

## 2015-07-28 NOTE — Patient Instructions (Signed)
Please go to the lab for blood work. I will call you with your results.  For BP -- Keep taking your chronic medications as directed.  For acid Reflux -- start the Nexium daily as directed. If there is any issue with cost, please let me know.  For Pain -- take the hydrocodone as directed only when needed for severe pain. Otherwise tylenol should suffice.  For Insomnia -- stop the Ambien. Start the Trazodone taking 1/2 tablet nightly as directed. If tolerating after 1 week, increase to 1 tablet nightly.  Follow-up in 1 month. Return sooner if needed.

## 2015-07-28 NOTE — Progress Notes (Signed)
Pre visit review using our clinic review tool, if applicable. No additional management support is needed unless otherwise documented below in the visit note/SLS  

## 2015-07-28 NOTE — Progress Notes (Signed)
Patient presents to clinic today for medication management.  Hypertension -- Is currently on a regimen of lisinopril 20 mg and Maxzide 37.5-25 mg daily. Endorses taking daily as directed. Patient denies chest pain, palpitations, lightheadedness, dizziness, vision changes or frequent headaches.  BP Readings from Last 3 Encounters:  07/28/15 126/78  05/12/15 120/58  04/20/15 118/78   GERD -- Is currently on the Prilosec OTC. Is having issue on most days of the week. Endorses heart burn, globus sensation. Denies chest pain. Endorses good relief in symptoms with medication.  Is having a hard time affording cost of the medication OTC.  Insomnia -- Currently on Ambien 10 mg. Endorses taking as directed but is sub-therapeutic. Is waking up multiple times per night.  Endorses fatigue due to lack or sleep..  Chronic R knee pain -- secondary to significant osteoarthritis. Is followed by Orthopedics. Has recently had a knee injection. Was told that OA is deteriorating and she will need knee surgery. Endorses continued pain despite injection. Has been putting off surgery but now feels she needs it as her L knee is starting to become more painful as well.  Past Medical History  Diagnosis Date  . HTN (hypertension)   . Tobacco abuse   . Hypokalemia 06/2005    2nd to diuretic   . Pyelonephritis 0000000    Uncomplicated  . Hemorrhagic ovarian cyst 01/2006    Right  . 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 Recurrent boils     Underarms  . Esophageal stricture   . GERD (gastroesophageal reflux disease)   . Diverticulosis   . Hemorrhoids   . Adenomatous colon polyp   . Hyperplastic colon polyp   . UTI (lower urinary tract infection)   . Pneumonia   . Fluid in knee     Bilateral  . Osteoarthritis, knee     Bilateral  . Hiatal hernia     Current Outpatient Prescriptions on File Prior to Visit  Medication Sig Dispense Refill  . lisinopril (PRINIVIL,ZESTRIL) 20 MG tablet Take 1 tablet (20 mg total) by  mouth daily. 90 tablet 1  . omeprazole (PRILOSEC OTC) 20 MG tablet Take 20 mg by mouth daily.    Marland Kitchen triamterene-hydrochlorothiazide (MAXZIDE-25) 37.5-25 MG tablet Take 1 tablet by mouth daily. 90 tablet 1   No current facility-administered medications on file prior to visit.    No Known Allergies  Family History  Problem Relation Age of Onset  . Hypertension Father     Deceased  . Liver disease Father     Cirrhosis  . Kidney disease Maternal Aunt   . Healthy Mother     Living  . Diabetes Maternal Aunt     x2  . Healthy Sister     x2  . Healthy Brother     x1    Social History   Social History  . Marital Status: Single    Spouse Name: N/A  . Number of Children: 0  . Years of Education: 12th grade   Occupational History  . [employer]    Social History Main Topics  . Smoking status: Current Every Day Smoker -- 0.50 packs/day for 30 years  . Smokeless tobacco: None  . Alcohol Use: 0.0 oz/week    0 Standard drinks or equivalent per week     Comment: 4 beers on weekends  . Drug Use: No  . Sexual Activity: Not Asked   Other Topics Concern  . None   Social History Narrative   Lives by  herself, has stable partner   Review of Systems - See HPI.  All other ROS are negative.  BP 126/78 mmHg  Pulse 83  Temp(Src) 98.1 F (36.7 C) (Oral)  Resp 16  Ht 5\' 8"  (1.727 m)  Wt 195 lb 6 oz (88.622 kg)  BMI 29.71 kg/m2  SpO2 99%  Physical Exam  Constitutional: She is oriented to person, place, and time and well-developed, well-nourished, and in no distress.  HENT:  Head: Normocephalic and atraumatic.  Eyes: Conjunctivae are normal.  Cardiovascular: Normal rate, regular rhythm, normal heart sounds and intact distal pulses.   Pulmonary/Chest: Effort normal and breath sounds normal. No respiratory distress. She has no wheezes. She has no rales. She exhibits no tenderness.  Neurological: She is alert and oriented to person, place, and time.  Skin: Skin is warm and dry. No rash  noted.  Psychiatric: Affect normal.  Vitals reviewed.   No results found for this or any previous visit (from the past 2160 hour(s)).  Assessment/Plan: Insomnia Discontinue Ambien. Will begin trial of Trazodone 25 mg QHS. Will titrate to 50 mg if needed.   Primary osteoarthritis of both knees Deteriorating. Followed by Orthopedics. Needs replacement. She is to FU with specialist as scheduled. Will give Rx Norco to use sparingly for significant pain. Supportive measures reviewed.  GERD Rx Nexium to take daily. GERD diet reviewed. She is to refrain from eating before bed and she will elevate the head of bed. FU if not resolving.  Essential hypertension Well-controlled. Continue current regimen. Will check BMP today. FU 6 months.

## 2015-07-29 ENCOUNTER — Telehealth: Payer: Self-pay | Admitting: Physician Assistant

## 2015-07-29 DIAGNOSIS — I10 Essential (primary) hypertension: Secondary | ICD-10-CM

## 2015-07-29 LAB — BASIC METABOLIC PANEL
BUN: 19 mg/dL (ref 6–23)
CALCIUM: 9.3 mg/dL (ref 8.4–10.5)
CO2: 34 meq/L — AB (ref 19–32)
CREATININE: 0.91 mg/dL (ref 0.40–1.20)
Chloride: 100 mEq/L (ref 96–112)
GFR: 82.3 mL/min (ref >60.00)
GLUCOSE: 83 mg/dL (ref 70–99)
Potassium: 3.9 mEq/L (ref 3.5–5.1)
Sodium: 138 mEq/L (ref 135–145)

## 2015-07-29 MED ORDER — LISINOPRIL 20 MG PO TABS: 20.0000 mg | ORAL_TABLET | Freq: Every day | ORAL | Status: DC

## 2015-07-29 MED ORDER — TRIAMTERENE-HCTZ 37.5-25 MG PO TABS: 1.0000 | ORAL_TABLET | Freq: Every day | ORAL | Status: DC

## 2015-07-29 NOTE — Assessment & Plan Note (Signed)
Well-controlled. Continue current regimen. Will check BMP today. FU 6 months.

## 2015-07-29 NOTE — Telephone Encounter (Addendum)
Refill sent per [facility] refill protocol/SLS  

## 2015-07-29 NOTE — Telephone Encounter (Signed)
Medication refill for Hydrochlorothiazide, Lisinopril .   Pharmacy: [id number] [place], [place], pt says that her insurance will not cover Nexium tablets Rx would need to be changed to capsul instead.    Please advise.

## 2015-07-29 NOTE — Assessment & Plan Note (Signed)
Discontinue Ambien. Will begin trial of Trazodone 25 mg QHS. Will titrate to 50 mg if needed.

## 2015-07-29 NOTE — Assessment & Plan Note (Signed)
Rx Nexium to take daily. GERD diet reviewed. She is to refrain from eating before bed and she will elevate the head of bed. FU if not resolving.

## 2015-07-29 NOTE — Assessment & Plan Note (Signed)
Deteriorating. Followed by Orthopedics. Needs replacement. She is to FU with specialist as scheduled. Will give Rx Norco to use sparingly for significant pain. Supportive measures reviewed.

## 2015-08-04 ENCOUNTER — Other Ambulatory Visit: Payer: Self-pay

## 2016-01-01 ENCOUNTER — Encounter: Payer: Self-pay | Admitting: Physician Assistant

## 2016-01-01 ENCOUNTER — Ambulatory Visit (INDEPENDENT_AMBULATORY_CARE_PROVIDER_SITE_OTHER): Payer: Managed Care, Other (non HMO) | Admitting: Physician Assistant

## 2016-01-01 VITALS — BP 116/80 | HR 72 | Temp 97.9°F | Resp 16 | Ht 68.0 in | Wt 195.5 lb

## 2016-01-01 DIAGNOSIS — M170 Bilateral primary osteoarthritis of knee: Secondary | ICD-10-CM | POA: Diagnosis not present

## 2016-01-01 DIAGNOSIS — I10 Essential (primary) hypertension: Secondary | ICD-10-CM | POA: Diagnosis not present

## 2016-01-01 DIAGNOSIS — R200 Anesthesia of skin: Secondary | ICD-10-CM

## 2016-01-01 DIAGNOSIS — R202 Paresthesia of skin: Secondary | ICD-10-CM | POA: Diagnosis not present

## 2016-01-01 LAB — BASIC METABOLIC PANEL
BUN: 12 mg/dL (ref 6–23)
CO2: 30 mEq/L (ref 19–32)
Calcium: 9.4 mg/dL (ref 8.4–10.5)
Chloride: 101 mEq/L (ref 96–112)
Creatinine, Ser: 0.78 mg/dL (ref 0.40–1.20)
GFR: 98.17 mL/min (ref >60.00)
Glucose, Bld: 104 mg/dL — ABNORMAL HIGH (ref 70–99)
POTASSIUM: 3.6 meq/L (ref 3.5–5.1)
SODIUM: 139 meq/L (ref 135–145)

## 2016-01-01 LAB — VITAMIN B12: Vitamin B-12: 879 pg/mL (ref 211–911)

## 2016-01-01 MED ORDER — TRIAMTERENE-HCTZ 37.5-25 MG PO TABS: 1.0000 | ORAL_TABLET | Freq: Every day | ORAL | 1 refills | Status: DC

## 2016-01-01 MED ORDER — HYDROCODONE-ACETAMINOPHEN 10-325 MG PO TABS: 1.0000 | ORAL_TABLET | Freq: Three times a day (TID) | ORAL | 0 refills | Status: DC | PRN

## 2016-01-01 MED ORDER — OMEPRAZOLE MAGNESIUM 20 MG PO TBEC: 20.0000 mg | DELAYED_RELEASE_TABLET | Freq: Every day | ORAL | 3 refills | Status: DC

## 2016-01-01 MED ORDER — LISINOPRIL 20 MG PO TABS: 20.0000 mg | ORAL_TABLET | Freq: Every day | ORAL | 1 refills | Status: DC

## 2016-01-01 MED ORDER — GABAPENTIN 100 MG PO CAPS: 100.0000 mg | ORAL_CAPSULE | Freq: Every day | ORAL | 1 refills | Status: DC

## 2016-01-01 NOTE — Progress Notes (Signed)
Patient presents to clinic today c/o intermittent pain and tingling of RLE sometimes associated with back pain but other times in the absence of pain. Notes tingling and numbness of R great toe. Symptoms present intermittently for 3 weeks. Denies history of disc herniation but does have significant history of OA of multiple joints. Denies trauma or injury preceding symptoms. Denies weakness of lower extremities. Denies saddle anesthesia or change to bowel of bladder habits. Denies tingling of other extremities.  Past Medical History:  Diagnosis Date  . Adenomatous colon polyp   . Diverticulosis   . Esophageal stricture   . Fluid in knee    Bilateral  . GERD (gastroesophageal reflux disease)   . Hemorrhagic ovarian cyst 01/2006   Right  . Hemorrhoids   . Hiatal hernia   . HTN (hypertension)   . Hyperplastic colon polyp   . Hypokalemia 06/2005   2nd to diuretic   . Osteoarthritis, knee    Bilateral  . Pneumonia   . Pyelonephritis 0000000   Uncomplicated  . Recurrent boils    Underarms  . Tobacco abuse   . UTI (lower urinary tract infection)   . 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 omeprazole (PRILOSEC OTC) 20 MG tablet Take 20 mg by mouth daily.     No current facility-administered medications on file prior to visit.     No Known Allergies  Family History  Problem Relation Age of Onset  . Hypertension Father     Deceased  . Liver disease Father     Cirrhosis  . Kidney disease Maternal Aunt   . Healthy Mother     Living  . Diabetes Maternal Aunt     x2  . Healthy Sister     x2  . Healthy Brother     x1    Social History   Social History  . Marital status: Single    Spouse name: N/A  . Number of children: 0  . Years of education: 12th grade   Occupational History  . [employer]   Social History Main Topics  . Smoking status: Current Every Day Smoker   Packs/day: 0.50    Years: 30.00  . Smokeless tobacco: None  . Alcohol use 0.0 oz/week     Comment: 4 beers on weekends  . Drug use: No  . Sexual activity: Not Asked   Other Topics Concern  . None   Social History Narrative   Lives by herself, has stable partner   Review of Systems - See HPI.  All other ROS are negative.  BP 116/80 (BP Location: Left Arm, Patient Position: Sitting, Cuff Size: Large)   Pulse 72   Temp 97.9 F (36.6 C) (Oral)   Resp 16   Ht 5\' 8"  (1.727 m)   Wt 195 lb 8 oz (88.7 kg)   SpO2 98%   BMI 29.73 kg/m   Physical Exam  Constitutional: She is oriented to person, place, and time and well-developed, well-nourished, and in no distress.  HENT:  Head: Normocephalic and atraumatic.  Eyes: Conjunctivae are normal.  Cardiovascular: Normal rate, regular rhythm, normal heart sounds and intact distal pulses.   Pulmonary/Chest: Effort normal and breath sounds normal.  Musculoskeletal:       Right hip: She exhibits normal range of motion, normal strength and no tenderness.       Lumbar back: She exhibits normal range of  motion, no tenderness, no bony tenderness and no pain.       Right foot: There is normal range of motion, no tenderness and no bony tenderness.  Neurological: She is alert and oriented to person, place, and time.  Sensory examination of RLE performed. Sensation intact except for plantar surface of R great toe where this some mild decreased sensation with monofilament testing. Extremity is neurovascularly intact.  Skin: Skin is warm and dry. No rash noted.  Psychiatric: Affect normal.  Vitals reviewed.  Assessment/Plan: 1. Essential hypertension Stable. Medication refills given. Will check BMP today. - triamterene-hydrochlorothiazide (MAXZIDE-25) 37.5-25 MG tablet; Take 1 tablet by mouth daily.  Dispense: 90 tablet; Refill: 1 - lisinopril (PRINIVIL,ZESTRIL) 20 MG tablet; Take 1 tablet (20 mg total) by mouth daily.  Dispense: 90 tablet; Refill:  1 - Basic metabolic panel  2. Numbness and tingling of right lower extremity Will start Gabapentin. Will check B12 level but suspect spinal etiology. No alarm symptoms. Will check x-ray lumbar spine. May needs to consider MRI. - gabapentin (NEURONTIN) 100 MG capsule; Take 1 capsule (100 mg total) by mouth at bedtime.  Dispense: 30 capsule; Refill: 1 - DG Lumbar Spine Complete; Future - B12  3. Primary osteoarthritis of both knees Medication refilled. - HYDROcodone-acetaminophen (NORCO) 10-325 MG tablet; Take 1 tablet by mouth every 8 (eight) hours as needed.  Dispense: 30 tablet; Refill: 0   Darlene Gonzalez, [place]

## 2016-01-01 NOTE — Progress Notes (Signed)
Pre visit review using our clinic review tool, if applicable. No additional management support is needed unless otherwise documented below in the visit note/SLS  

## 2016-01-01 NOTE — Patient Instructions (Signed)
Please go to the lab for blood work. I will call with your results.  Then go downstairs for imaging.  Please start the Gabapentin each evening as directed. Follow-up in 2-3 weeks.  Continue chronic medications as directed.   Sciatica With Rehab The sciatic nerve runs from the back down the leg and is responsible for sensation and control of the muscles in the back (posterior) side of the thigh, lower leg, and foot. Sciatica is a condition that is characterized by inflammation of this nerve.  SYMPTOMS   Signs of nerve damage, including numbness and/or weakness along the posterior side of the lower extremity.  Pain in the back of the thigh that may also travel down the leg.  Pain that worsens when sitting for long periods of time.  Occasionally, pain in the back or buttock. CAUSES  Inflammation of the sciatic nerve is the cause of sciatica. The inflammation is due to something irritating the nerve. Common sources of irritation include:  Sitting for long periods of time.  Direct trauma to the nerve.  Arthritis of the spine.  Herniated or ruptured disk.  Slipping of the vertebrae (spondylolisthesis).  Pressure from soft tissues, such as muscles or ligament-like tissue (fascia). RISK INCREASES WITH:  Sports that place pressure or stress on the spine (football or weightlifting).  Poor strength and flexibility.  Failure to warm up properly before activity.  Family history of low back pain or disk disorders.  Previous back injury or surgery.  Poor body mechanics, especially when lifting, or poor posture. PREVENTION   Warm up and stretch properly before activity.  Maintain physical fitness:  Strength, flexibility, and endurance.  Cardiovascular fitness.  Learn and use proper technique, especially with posture and lifting. When possible, have coach correct improper technique.  Avoid activities that place stress on the spine. PROGNOSIS If treated properly, then  sciatica usually resolves within 6 weeks. However, occasionally surgery is necessary.  RELATED COMPLICATIONS   Permanent nerve damage, including pain, numbness, tingle, or weakness.  Chronic back pain.  Risks of surgery: infection, bleeding, nerve damage, or damage to surrounding tissues. TREATMENT Treatment initially involves resting from any activities that aggravate your symptoms. The use of ice and medication may help reduce pain and inflammation. The use of strengthening and stretching exercises may help reduce pain with activity. These exercises may be performed at home or with referral to a therapist. A therapist may recommend further treatments, such as transcutaneous electronic nerve stimulation (TENS) or ultrasound. Your caregiver may recommend corticosteroid injections to help reduce inflammation of the sciatic nerve. If symptoms persist despite non-surgical (conservative) treatment, then surgery may be recommended. MEDICATION  If pain medication is necessary, then nonsteroidal anti-inflammatory medications, such as aspirin and ibuprofen, or other minor pain relievers, such as acetaminophen, are often recommended.  Do not take pain medication for 7 days before surgery.  Prescription pain relievers may be given if deemed necessary by your caregiver. Use only as directed and only as much as you need.  Ointments applied to the skin may be helpful.  Corticosteroid injections may be given by your caregiver. These injections should be reserved for the most serious cases, because they may only be given a certain number of times. HEAT AND COLD  Cold treatment (icing) relieves pain and reduces inflammation. Cold treatment should be applied for 10 to 15 minutes every 2 to 3 hours for inflammation and pain and immediately after any activity that aggravates your symptoms. Use ice packs or massage the area  with a piece of ice (ice massage).  Heat treatment may be used prior to performing the  stretching and strengthening activities prescribed by your caregiver, physical therapist, or athletic trainer. Use a heat pack or soak the injury in warm water. SEEK MEDICAL CARE IF:  Treatment seems to offer no benefit, or the condition worsens.  Any medications produce adverse side effects. EXERCISES  RANGE OF MOTION (ROM) AND STRETCHING EXERCISES - Sciatica Most people with sciatic will find that their symptoms worsen with either excessive bending forward (flexion) or arching at the low back (extension). The exercises which will help resolve your symptoms will focus on the opposite motion. Your physician, physical therapist or athletic trainer will help you determine which exercises will be most helpful to resolve your low back pain. Do not complete any exercises without first consulting with your clinician. Discontinue any exercises which worsen your symptoms until you speak to your clinician. If you have pain, numbness or tingling which travels down into your buttocks, leg or foot, the goal of the therapy is for these symptoms to move closer to your back and eventually resolve. Occasionally, these leg symptoms will get better, but your low back pain may worsen; this is typically an indication of progress in your rehabilitation. Be certain to be very alert to any changes in your symptoms and the activities in which you participated in the 24 hours prior to the change. Sharing this information with your clinician will allow him/her to most efficiently treat your condition. These exercises may help you when beginning to rehabilitate your injury. Your symptoms may resolve with or without further involvement from your physician, physical therapist or athletic trainer. While completing these exercises, remember:   Restoring tissue flexibility helps normal motion to return to the joints. This allows healthier, less painful movement and activity.  An effective stretch should be held for at least 30  seconds.  A stretch should never be painful. You should only feel a gentle lengthening or release in the stretched tissue. FLEXION RANGE OF MOTION AND STRETCHING EXERCISES: STRETCH - Flexion, Single Knee to Chest   Lie on a firm bed or floor with both legs extended in front of you.  Keeping one leg in contact with the floor, bring your opposite knee to your chest. Hold your leg in place by either grabbing behind your thigh or at your knee.  Pull until you feel a gentle stretch in your low back. Hold __________ seconds.  Slowly release your grasp and repeat the exercise with the opposite side. Repeat __________ times. Complete this exercise __________ times per day.  STRETCH - Flexion, Double Knee to Chest  Lie on a firm bed or floor with both legs extended in front of you.  Keeping one leg in contact with the floor, bring your opposite knee to your chest.  Tense your stomach muscles to support your back and then lift your other knee to your chest. Hold your legs in place by either grabbing behind your thighs or at your knees.  Pull both knees toward your chest until you feel a gentle stretch in your low back. Hold __________ seconds.  Tense your stomach muscles and slowly return one leg at a time to the floor. Repeat __________ times. Complete this exercise __________ times per day.  STRETCH - Low Trunk Rotation   Lie on a firm bed or floor. Keeping your legs in front of you, bend your knees so they are both pointed toward the ceiling and your  feet are flat on the floor.  Extend your arms out to the side. This will stabilize your upper body by keeping your shoulders in contact with the floor.  Gently and slowly drop both knees together to one side until you feel a gentle stretch in your low back. Hold for __________ seconds.  Tense your stomach muscles to support your low back as you bring your knees back to the starting position. Repeat the exercise to the other side. Repeat  __________ times. Complete this exercise __________ times per day  EXTENSION RANGE OF MOTION AND FLEXIBILITY EXERCISES: STRETCH - Extension, Prone on Elbows  Lie on your stomach on the floor, a bed will be too soft. Place your palms about shoulder width apart and at the height of your head.  Place your elbows under your shoulders. If this is too painful, stack pillows under your chest.  Allow your body to relax so that your hips drop lower and make contact more completely with the floor.  Hold this position for __________ seconds.  Slowly return to lying flat on the floor. Repeat __________ times. Complete this exercise __________ times per day.  RANGE OF MOTION - Extension, Prone Press Ups  Lie on your stomach on the floor, a bed will be too soft. Place your palms about shoulder width apart and at the height of your head.  Keeping your back as relaxed as possible, slowly straighten your elbows while keeping your hips on the floor. You may adjust the placement of your hands to maximize your comfort. As you gain motion, your hands will come more underneath your shoulders.  Hold this position __________ seconds.  Slowly return to lying flat on the floor. Repeat __________ times. Complete this exercise __________ times per day.  STRENGTHENING EXERCISES - Sciatica  These exercises may help you when beginning to rehabilitate your injury. These exercises should be done near your "sweet spot." This is the neutral, low-back arch, somewhere between fully rounded and fully arched, that is your least painful position. When performed in this safe range of motion, these exercises can be used for people who have either a flexion or extension based injury. These exercises may resolve your symptoms with or without further involvement from your physician, physical therapist or athletic trainer. While completing these exercises, remember:   Muscles can gain both the endurance and the strength needed for  everyday activities through controlled exercises.  Complete these exercises as instructed by your physician, physical therapist or athletic trainer. Progress with the resistance and repetition exercises only as your caregiver advises.  You may experience muscle soreness or fatigue, but the pain or discomfort you are trying to eliminate should never worsen during these exercises. If this pain does worsen, stop and make certain you are following the directions exactly. If the pain is still present after adjustments, discontinue the exercise until you can discuss the trouble with your clinician. STRENGTHENING - Deep Abdominals, Pelvic Tilt   Lie on a firm bed or floor. Keeping your legs in front of you, bend your knees so they are both pointed toward the ceiling and your feet are flat on the floor.  Tense your lower abdominal muscles to press your low back into the floor. This motion will rotate your pelvis so that your tail bone is scooping upwards rather than pointing at your feet or into the floor.  With a gentle tension and even breathing, hold this position for __________ seconds. Repeat __________ times. Complete this exercise __________ times  per day.  STRENGTHENING - Abdominals, Crunches   Lie on a firm bed or floor. Keeping your legs in front of you, bend your knees so they are both pointed toward the ceiling and your feet are flat on the floor. Cross your arms over your chest.  Slightly tip your chin down without bending your neck.  Tense your abdominals and slowly lift your trunk high enough to just clear your shoulder blades. Lifting higher can put excessive stress on the low back and does not further strengthen your abdominal muscles.  Control your return to the starting position. Repeat __________ times. Complete this exercise __________ times per day.  STRENGTHENING - Quadruped, Opposite UE/LE Lift  Assume a hands and knees position on a firm surface. Keep your hands under your  shoulders and your knees under your hips. You may place padding under your knees for comfort.  Find your neutral spine and gently tense your abdominal muscles so that you can maintain this position. Your shoulders and hips should form a rectangle that is parallel with the floor and is not twisted.  Keeping your trunk steady, lift your right hand no higher than your shoulder and then your left leg no higher than your hip. Make sure you are not holding your breath. Hold this position __________ seconds.  Continuing to keep your abdominal muscles tense and your back steady, slowly return to your starting position. Repeat with the opposite arm and leg. Repeat __________ times. Complete this exercise __________ times per day.  STRENGTHENING - Abdominals and Quadriceps, Straight Leg Raise   Lie on a firm bed or floor with both legs extended in front of you.  Keeping one leg in contact with the floor, bend the other knee so that your foot can rest flat on the floor.  Find your neutral spine, and tense your abdominal muscles to maintain your spinal position throughout the exercise.  Slowly lift your straight leg off the floor about 6 inches for a count of 15, making sure to not hold your breath.  Still keeping your neutral spine, slowly lower your leg all the way to the floor. Repeat this exercise with each leg __________ times. Complete this exercise __________ times per day. POSTURE AND BODY MECHANICS CONSIDERATIONS - Sciatica Keeping correct posture when sitting, standing or completing your activities will reduce the stress put on different body tissues, allowing injured tissues a chance to heal and limiting painful experiences. The following are general guidelines for improved posture. Your physician or physical therapist will provide you with any instructions specific to your needs. While reading these guidelines, remember:  The exercises prescribed by your provider will help you have the  flexibility and strength to maintain correct postures.  The correct posture provides the optimal environment for your joints to work. All of your joints have less wear and tear when properly supported by a spine with good posture. This means you will experience a healthier, less painful body.  Correct posture must be practiced with all of your activities, especially prolonged sitting and standing. Correct posture is as important when doing repetitive low-stress activities (typing) as it is when doing a single heavy-load activity (lifting). RESTING POSITIONS Consider which positions are most painful for you when choosing a resting position. If you have pain with flexion-based activities (sitting, bending, stooping, squatting), choose a position that allows you to rest in a less flexed posture. You would want to avoid curling into a fetal position on your side. If your pain worsens  with extension-based activities (prolonged standing, working overhead), avoid resting in an extended position such as sleeping on your stomach. Most people will find more comfort when they rest with their spine in a more neutral position, neither too rounded nor too arched. Lying on a non-sagging bed on your side with a pillow between your knees, or on your back with a pillow under your knees will often provide some relief. Keep in mind, being in any one position for a prolonged period of time, no matter how correct your posture, can still lead to stiffness. PROPER SITTING POSTURE In order to minimize stress and discomfort on your spine, you must sit with correct posture Sitting with good posture should be effortless for a healthy body. Returning to good posture is a gradual process. Many people can work toward this most comfortably by using various supports until they have the flexibility and strength to maintain this posture on their own. When sitting with proper posture, your ears will fall over your shoulders and your shoulders  will fall over your hips. You should use the back of the chair to support your upper back. Your low back will be in a neutral position, just slightly arched. You may place a small pillow or folded towel at the base of your low back for support.  When working at a desk, create an environment that supports good, upright posture. Without extra support, muscles fatigue and lead to excessive strain on joints and other tissues. Keep these recommendations in mind: CHAIR:   A chair should be able to slide under your desk when your back makes contact with the back of the chair. This allows you to work closely.  The chair's height should allow your eyes to be level with the upper part of your monitor and your hands to be slightly lower than your elbows. BODY POSITION  Your feet should make contact with the floor. If this is not possible, use a foot rest.  Keep your ears over your shoulders. This will reduce stress on your neck and low back. INCORRECT SITTING POSTURES   If you are feeling tired and unable to assume a healthy sitting posture, do not slouch or slump. This puts excessive strain on your back tissues, causing more damage and pain. Healthier options include:  Using more support, like a lumbar pillow.  Switching tasks to something that requires you to be upright or walking.  Talking a brief walk.  Lying down to rest in a neutral-spine position. PROLONGED STANDING WHILE SLIGHTLY LEANING FORWARD  When completing a task that requires you to lean forward while standing in one place for a long time, place either foot up on a stationary 2-4 inch high object to help maintain the best posture. When both feet are on the ground, the low back tends to lose its slight inward curve. If this curve flattens (or becomes too large), then the back and your other joints will experience too much stress, fatigue more quickly and can cause pain.  CORRECT STANDING POSTURES Proper standing posture should be assumed  with all daily activities, even if they only take a few moments, like when brushing your teeth. As in sitting, your ears should fall over your shoulders and your shoulders should fall over your hips. You should keep a slight tension in your abdominal muscles to brace your spine. Your tailbone should point down to the ground, not behind your body, resulting in an over-extended swayback posture.  INCORRECT STANDING POSTURES  Common incorrect standing  postures include a forward head, locked knees and/or an excessive swayback. WALKING Walk with an upright posture. Your ears, shoulders and hips should all line-up. PROLONGED ACTIVITY IN A FLEXED POSITION When completing a task that requires you to bend forward at your waist or lean over a low surface, try to find a way to stabilize 3 of 4 of your limbs. You can place a hand or elbow on your thigh or rest a knee on the surface you are reaching across. This will provide you more stability so that your muscles do not fatigue as quickly. By keeping your knees relaxed, or slightly bent, you will also reduce stress across your low back. CORRECT LIFTING TECHNIQUES DO :   Assume a wide stance. This will provide you more stability and the opportunity to get as close as possible to the object which you are lifting.  Tense your abdominals to brace your spine; then bend at the knees and hips. Keeping your back locked in a neutral-spine position, lift using your leg muscles. Lift with your legs, keeping your back straight.  Test the weight of unknown objects before attempting to lift them.  Try to keep your elbows locked down at your sides in order get the best strength from your shoulders when carrying an object.  Always ask for help when lifting heavy or awkward objects. INCORRECT LIFTING TECHNIQUES DO NOT:   Lock your knees when lifting, even if it is a small object.  Bend and twist. Pivot at your feet or move your feet when needing to change  directions.  Assume that you cannot safely pick up a paperclip without proper posture.   This information is not intended to replace advice given to you by your health care provider. Make sure you discuss any questions you have with your health care provider.   Document Released: 02/07/2005 Document Revised: 06/24/2014 Document Reviewed: 05/22/2008 Elsevier Interactive Patient Education [employer].

## 2016-07-28 ENCOUNTER — Other Ambulatory Visit: Payer: Self-pay | Admitting: Physician Assistant

## 2016-07-28 DIAGNOSIS — I10 Essential (primary) hypertension: Secondary | ICD-10-CM

## 2016-08-17 ENCOUNTER — Ambulatory Visit (INDEPENDENT_AMBULATORY_CARE_PROVIDER_SITE_OTHER): Payer: Managed Care, Other (non HMO) | Admitting: Physician Assistant

## 2016-08-17 ENCOUNTER — Encounter: Payer: Self-pay | Admitting: Physician Assistant

## 2016-08-17 VITALS — BP 120/70 | HR 82 | Temp 98.7°F | Resp 14 | Ht 68.0 in | Wt 179.0 lb

## 2016-08-17 DIAGNOSIS — I10 Essential (primary) hypertension: Secondary | ICD-10-CM

## 2016-08-17 DIAGNOSIS — F17200 Nicotine dependence, unspecified, uncomplicated: Secondary | ICD-10-CM | POA: Diagnosis not present

## 2016-08-17 DIAGNOSIS — Z114 Encounter for screening for human immunodeficiency virus [HIV]: Secondary | ICD-10-CM | POA: Insufficient documentation

## 2016-08-17 DIAGNOSIS — Z1159 Encounter for screening for other viral diseases: Secondary | ICD-10-CM

## 2016-08-17 DIAGNOSIS — Z9189 Other specified personal risk factors, not elsewhere classified: Secondary | ICD-10-CM

## 2016-08-17 MED ORDER — TRIAMTERENE-HCTZ 37.5-25 MG PO TABS
1.0000 | ORAL_TABLET | Freq: Every day | ORAL | 1 refills | Status: DC
Start: 2016-08-17 — End: 2017-05-02

## 2016-08-17 MED ORDER — LISINOPRIL 20 MG PO TABS: 20.0000 mg | ORAL_TABLET | Freq: Every day | ORAL | 1 refills | Status: DC

## 2016-08-17 MED ORDER — TRIAMTERENE-HCTZ 37.5-25 MG PO TABS: 1.0000 | ORAL_TABLET | Freq: Every day | ORAL | 1 refills | Status: DC

## 2016-08-17 NOTE — Assessment & Plan Note (Signed)
BP stable. Asymptomatic. Repeat labs today. Continue current regimen. 

## 2016-08-17 NOTE — Progress Notes (Signed)
Pre visit review using our clinic review tool, if applicable. No additional management support is needed unless otherwise documented below in the visit note. 

## 2016-08-17 NOTE — Assessment & Plan Note (Signed)
Reviewed risks associated with continued smoking. Patient declines cessation today. CPE scheduled. Will readdress at physical and discuss future lung cancer screening if she does not cease cigarette use.

## 2016-08-17 NOTE — Progress Notes (Signed)
Patient presents to clinic today for follow-up of hypertension. Patient is currently on a regimen of Lisinopril 20 mg and triamterene-hydrochlorothiazide 37.5-25 mg daily. Is takingmedications daily as directed. Patient denies chest pain, palpitations, lightheadedness, dizziness, frequent headaches. Patient has noted gradual change in vision -- blurry with far-sight. Is due to repeat examination with Ophthalmology.  BP Readings from Last 3 Encounters:  08/17/16 120/70  01/01/16 116/80  07/28/15 126/78   Patient is currently a 15 pack-year smoker. Denies willing to quit at present.   Patient also due for mammogram (Endorses having 12/2015 at [facility] -- will need to get records), HIV screen (Agrees to test today), Hepatitis C (Agrees to have today)  Past Medical History:  Diagnosis Date  . Adenomatous colon polyp   . Diverticulosis   . Esophageal stricture   . Fluid in knee    Bilateral  . GERD (gastroesophageal reflux disease)   . Hemorrhagic ovarian cyst 01/2006   Right  . Hemorrhoids   . Hiatal hernia   . HTN (hypertension)   . Hyperplastic colon polyp   . Hypokalemia 06/2005   2nd to diuretic   . Osteoarthritis, knee    Bilateral  . Pneumonia   . Pyelonephritis 10/6043   Uncomplicated  . Recurrent boils    Underarms  . Tobacco abuse   . UTI (lower urinary tract infection)   . 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 gabapentin (NEURONTIN) 100 MG capsule Take 1 capsule (100 mg total) by mouth at bedtime. 30 capsule 1   No current facility-administered medications on file prior to visit.     No Known Allergies  Family History  Problem Relation Age of Onset  . Hypertension Father        Deceased  . Liver disease Father        Cirrhosis  . Kidney disease Maternal Aunt   . Healthy Mother        Living  . Diabetes Maternal Aunt        x2  . Healthy Sister        x2  . Healthy  Brother        x1    Social History   Social History  . Marital status: Single    Spouse name: N/A  . Number of children: 0  . Years of education: 12th grade   Occupational History  . [employer]   Social History Main Topics  . Smoking status: Current Every Day Smoker    Packs/day: 0.50    Years: 30.00  . Smokeless tobacco: Never Used  . Alcohol use 0.0 oz/week     Comment: 4 beers on weekends  . Drug use: No  . Sexual activity: Not Asked   Other Topics Concern  . None   Social History Narrative   Lives by herself, has stable partner   Review of Systems - See HPI.  All other ROS are negative.  BP 120/70   Pulse 82   Temp 98.7 F (37.1 C) (Oral)   Resp 14   Ht 5\' 8"  (1.727 m)   Wt 179 lb (81.2 kg)   SpO2 98%   BMI 27.22 kg/m   Physical Exam  Constitutional: She is oriented to person, place, and time and well-developed, well-nourished, and in no distress.  HENT:  Head: Normocephalic and atraumatic.  Right Ear: External ear normal.  Eyes: Conjunctivae are  normal.  Neck: Neck supple.  Cardiovascular: Normal rate, regular rhythm, normal heart sounds and intact distal pulses.   Pulmonary/Chest: Effort normal. No respiratory distress. She has no wheezes. She has no rales. She exhibits no tenderness.  Neurological: She is alert and oriented to person, place, and time.  Skin: Skin is warm and dry. No rash noted.  Psychiatric: Affect normal.  Vitals reviewed.  Assessment/Plan: TOBACCO ABUSE Reviewed risks associated with continued smoking. Patient declines cessation today. CPE scheduled. Will readdress at physical and discuss future lung cancer screening if she does not cease cigarette use.  Screening for HIV without presence of risk factors CDC recommendation for one-time HIV screen. Patient denies having previously. Order placed today.  Essential hypertension BP stable. Asymptomatic. Repeat labs today. Continue current regimen.   Encounter  for hepatitis C virus screening test for high risk patient Order for Hep C antibody placed.    Leeanne Rio, PA-C

## 2016-08-17 NOTE — Assessment & Plan Note (Signed)
Order for Hep C antibody placed.

## 2016-08-17 NOTE — Patient Instructions (Signed)
Please go to the lab for blood work. I will call you with your results.  Please continue medications as directed. Please schedule an appointment with me for a complete physical. Also follow-up with Dr. Jodi Mourning for routine gynecological care. We are requesting records of your mammogram from this past fall.

## 2016-08-17 NOTE — Addendum Note (Signed)
Addended by: Leonidas Romberg on: 08/17/2016 04:33 PM   Modules accepted: Orders

## 2016-08-17 NOTE — Assessment & Plan Note (Signed)
CDC recommendation for one-time HIV screen. Patient denies having previously. Order placed today.

## 2016-08-18 LAB — COMPREHENSIVE METABOLIC PANEL
ALT: 16 U/L (ref 6–29)
AST: 21 U/L (ref 10–35)
Albumin: 4.3 g/dL (ref 3.6–5.1)
Alkaline Phosphatase: 88 U/L (ref 33–130)
BUN: 13 mg/dL (ref 7–25)
CO2: 30 mmol/L (ref 20–31)
Calcium: 9.3 mg/dL (ref 8.6–10.4)
Chloride: 99 mmol/L (ref 98–110)
Creat: 0.82 mg/dL (ref 0.50–1.05)
Glucose, Bld: 86 mg/dL (ref 65–99)
POTASSIUM: 3.5 mmol/L (ref 3.5–5.3)
Sodium: 140 mmol/L (ref 135–146)
TOTAL PROTEIN: 7.3 g/dL (ref 6.1–8.1)
Total Bilirubin: 0.7 mg/dL (ref 0.2–1.2)

## 2016-08-18 LAB — HEPATITIS C ANTIBODY: HCV Ab: NEGATIVE

## 2016-08-18 LAB — HIV ANTIBODY (ROUTINE TESTING W REFLEX): HIV 1&2 Ab, 4th Generation: NONREACTIVE

## 2016-09-12 ENCOUNTER — Encounter: Payer: Managed Care, Other (non HMO) | Admitting: Physician Assistant

## 2016-09-26 ENCOUNTER — Encounter: Payer: Self-pay | Admitting: Physician Assistant

## 2016-09-26 ENCOUNTER — Ambulatory Visit (INDEPENDENT_AMBULATORY_CARE_PROVIDER_SITE_OTHER): Payer: Managed Care, Other (non HMO) | Admitting: Physician Assistant

## 2016-09-26 VITALS — BP 126/80 | HR 82 | Temp 98.6°F | Resp 14 | Ht 68.0 in | Wt 178.0 lb

## 2016-09-26 DIAGNOSIS — Z0001 Encounter for general adult medical examination with abnormal findings: Secondary | ICD-10-CM | POA: Diagnosis not present

## 2016-09-26 DIAGNOSIS — K219 Gastro-esophageal reflux disease without esophagitis: Secondary | ICD-10-CM

## 2016-09-26 DIAGNOSIS — Z0000 Encounter for general adult medical examination without abnormal findings: Secondary | ICD-10-CM

## 2016-09-26 DIAGNOSIS — I10 Essential (primary) hypertension: Secondary | ICD-10-CM | POA: Diagnosis not present

## 2016-09-26 LAB — CBC
HCT: 40.4 % (ref 35.0–45.0)
Hemoglobin: 13.1 g/dL (ref 11.7–15.5)
MCH: 30.8 pg (ref 27.0–33.0)
MCHC: 32.4 g/dL (ref 32.0–36.0)
MCV: 94.8 fL (ref 80.0–100.0)
MPV: 9.8 fL (ref 7.5–12.5)
PLATELETS: 277 10*3/uL (ref 140–400)
RBC: 4.26 MIL/uL (ref 3.80–5.10)
RDW: 13.5 % (ref 11.0–15.0)
WBC: 6 10*3/uL (ref 3.8–10.8)

## 2016-09-26 MED ORDER — LANSOPRAZOLE 30 MG PO CPDR: 30.0000 mg | DELAYED_RELEASE_CAPSULE | Freq: Every day | ORAL | 1 refills | Status: DC

## 2016-09-26 MED ORDER — MELOXICAM 7.5 MG PO TABS: 7.5000 mg | ORAL_TABLET | Freq: Every day | ORAL | 0 refills | Status: DC

## 2016-09-26 NOTE — Assessment & Plan Note (Signed)
Depression screen negative. Health Maintenance reviewed. Patient endorses last Mammogram fall 2017 and normal. Will attempt to obtain records from GYN. Due for repeat this fall. Preventive schedule discussed and handout given in AVS. Will obtain fasting labs today.

## 2016-09-26 NOTE — Progress Notes (Signed)
Patient presents to clinic today for annual exam.  Patient is fasting for labs.  Acute Concerns: GERD -- Patient endorses intermittent symptoms but occurring most days of the week. Endorses heartburn without nausea or vomiting. Denies epigastric pain. Is mindful of foods. Was unable to afford OTC prilosec.   Chronic Issues: Hypertension -- Is currently on a regimen of lisinopril 20 mg and Maxzide 27.5-25 mg. Is taking as directed. Patient denies chest pain, palpitations, lightheadedness, dizziness, vision changes or frequent headaches.  BP Readings from Last 3 Encounters:  09/26/16 126/80  08/17/16 120/70  01/01/16 116/80   Health Maintenance: Immunizations -- up-to-date Colonoscopy -- up-to-date. Mammogram -- Patient endorses PAP -- s/p hysterectomy  Past Medical History:  Diagnosis Date  . Adenomatous colon polyp   . Diverticulosis   . Esophageal stricture   . Fluid in knee    Bilateral  . GERD (gastroesophageal reflux disease)   . Hemorrhagic ovarian cyst 01/2006   Right  . Hemorrhoids   . Hiatal hernia   . HTN (hypertension)   . Hyperplastic colon polyp   . Hypokalemia 06/2005   2nd to diuretic   . Osteoarthritis, knee    Bilateral  . Pneumonia   . Pyelonephritis 09/2954   Uncomplicated  . Recurrent boils    Underarms  . Tobacco abuse   . UTI (lower urinary tract infection)   . 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 ABDOMINAL HYSTERECTOMY  1998   Dr Jodi Mourning for fibroids  . BREAST BIOPSY    . BREAST CYST EXCISION     Right    Current Outpatient Prescriptions on File Prior to Visit  Medication Sig Dispense Refill  . lisinopril (PRINIVIL,ZESTRIL) 20 MG tablet Take 1 tablet (20 mg total) by mouth daily. 90 tablet 1  . triamterene-hydrochlorothiazide (MAXZIDE-25) 37.5-25 MG tablet Take 1 tablet by mouth daily. 90 tablet 1   No current facility-administered medications on file prior to visit.     No Known  Allergies  Family History  Problem Relation Age of Onset  . Hypertension Father        Deceased  . Liver disease Father        Cirrhosis  . Kidney disease Maternal Aunt   . Healthy Mother        Living  . Diabetes Maternal Aunt        x2  . Healthy Sister        x2  . Healthy Brother        x1    Social History   Social History  . Marital status: Single    Spouse name: N/A  . Number of children: 0  . Years of education: 12th grade   Occupational History  . [employer]   Social History Main Topics  . Smoking status: Current Every Day Smoker    Packs/day: 0.50    Years: 30.00    Types: Cigarettes  . Smokeless tobacco: Never Used  . Alcohol use 0.0 oz/week     Comment: 6-8 beers on weekends  . Drug use: No  . Sexual activity: Not on file   Other Topics Concern  . Not on file   Social History Narrative   Lives by herself, has stable partner   Review of Systems  Constitutional: Negative for fever and weight loss.  HENT: Negative for ear discharge, ear pain, hearing loss and tinnitus.   Eyes: Negative for blurred vision,  double vision, photophobia and pain.  Respiratory: Negative for cough and shortness of breath.   Cardiovascular: Negative for chest pain and palpitations.  Gastrointestinal: Negative for abdominal pain, blood in stool, constipation, diarrhea, heartburn, melena, nausea and vomiting.  Genitourinary: Negative for dysuria, flank pain, frequency, hematuria and urgency.  Musculoskeletal: Negative for falls.  Neurological: Negative for dizziness, loss of consciousness and headaches.  Endo/Heme/Allergies: Negative for environmental allergies.  Psychiatric/Behavioral: Negative for depression, hallucinations, substance abuse and suicidal ideas. The patient is not nervous/anxious and does not have insomnia.    BP 126/80   Pulse 82   Temp 98.6 F (37 C) (Oral)   Resp 14   Ht 5\' 8"  (1.727 m)   Wt 178 lb (80.7 kg)   SpO2 99%   BMI 27.06  kg/m   Physical Exam  Constitutional: She is oriented to person, place, and time and well-developed, well-nourished, and in no distress.  HENT:  Head: Normocephalic and atraumatic.  Right Ear: Tympanic membrane, external ear and ear canal normal.  Left Ear: Tympanic membrane, external ear and ear canal normal.  Nose: Nose normal. No mucosal edema.  Mouth/Throat: Uvula is midline, oropharynx is clear and moist and mucous membranes are normal. No oropharyngeal exudate or posterior oropharyngeal erythema.  Eyes: Pupils are equal, round, and reactive to light. Conjunctivae are normal.  Neck: Neck supple. No thyromegaly present.  Cardiovascular: Normal rate, regular rhythm, normal heart sounds and intact distal pulses.   Pulmonary/Chest: Effort normal and breath sounds normal. No respiratory distress. She has no wheezes. She has no rales.  Abdominal: Soft. Bowel sounds are normal. She exhibits no distension and no mass. There is no tenderness. There is no rebound and no guarding.  Lymphadenopathy:    She has no cervical adenopathy.  Neurological: She is alert and oriented to person, place, and time. No cranial nerve deficit.  Skin: Skin is warm and dry. No rash noted.  Psychiatric: Affect normal.  Vitals reviewed.  Recent Results (from the past 2160 hour(s))  HIV antibody     Status: None   Collection Time: 08/17/16  4:29 PM  Result Value Ref Range   HIV 1&2 Ab, 4th Generation NONREACTIVE NONREACTIVE    Comment:   HIV-1 antigen and HIV-1/HIV-2 antibodies were not detected.  There is no laboratory evidence of HIV infection.   HIV-1/2 Antibody Diff        Not indicated. HIV-1 RNA, Qual TMA          Not indicated.     PLEASE NOTE: This information has been disclosed to you from records whose confidentiality may be protected by state law. If your state requires such protection, then the state law prohibits you from making any further disclosure of the information without the specific  written consent of the person to whom it pertains, or as otherwise permitted by law. A general authorization for the release of medical or other information is NOT sufficient for this purpose.   The performance of this assay has not been clinically validated in patients less than 23 years old.   For additional information please refer to http://education.questdiagnostics.com/faq/FAQ106.  (This link is being provided for informational/educational purposes only.)     Hepatitis C Antibody     Status: None   Collection Time: 08/17/16  4:29 PM  Result Value Ref Range   HCV Ab NEGATIVE NEGATIVE  Comprehensive metabolic panel     Status: None   Collection Time: 08/17/16  4:33 PM  Result Value Ref Range  Sodium 140 135 - 146 mmol/L   Potassium 3.5 3.5 - 5.3 mmol/L   Chloride 99 98 - 110 mmol/L   CO2 30 20 - 31 mmol/L   Glucose, Bld 86 65 - 99 mg/dL   BUN 13 7 - 25 mg/dL   Creat 0.82 0.50 - 1.05 mg/dL    Comment:   For patients > or = 57 years of age: The upper reference limit for Creatinine is approximately 13% higher for people identified as African-American.      Total Bilirubin 0.7 0.2 - 1.2 mg/dL   Alkaline Phosphatase 88 33 - 130 U/L   AST 21 10 - 35 U/L   ALT 16 6 - 29 U/L   Total Protein 7.3 6.1 - 8.1 g/dL   Albumin 4.3 3.6 - 5.1 g/dL   Calcium 9.3 8.6 - 10.4 mg/dL    Assessment/Plan: Visit for preventive health examination Depression screen negative. Health Maintenance reviewed. Patient endorses last Mammogram fall 2017 and normal. Will attempt to obtain records from GYN. Due for repeat this fall. Preventive schedule discussed and handout given in AVS. Will obtain fasting labs today.   GERD Will start Prevacid and probiotic.  GERD diet started. Follow-up if not resolving.  Essential hypertension BP normotensive. Asymptomatic. Repeat labs today.     Leeanne Rio, PA-C

## 2016-09-26 NOTE — Patient Instructions (Signed)
Please go to the lab for blood work.   Our office will call you with your results unless you have chosen to receive results via MyChart.  If your blood work is normal we will follow-up each year for physicals and as scheduled for chronic medical problems.  If anything is abnormal we will treat accordingly and get you in for a follow-up.  Please start the Prevacid as directed. Follow the diet below. Please schedule a follow-up with Gynecology and Orthopedics.  Try the Mobic once daily with food to help with arthritis flare.    Food Choices for Gastroesophageal Reflux Disease, Adult When you have gastroesophageal reflux disease (GERD), the foods you eat and your eating habits are very important. Choosing the right foods can help ease your discomfort. What guidelines do I need to follow?  Choose fruits, vegetables, whole grains, and low-fat dairy products.  Choose low-fat meat, fish, and poultry.  Limit fats such as oils, salad dressings, butter, nuts, and avocado.  Keep a food diary. This helps you identify foods that cause symptoms.  Avoid foods that cause symptoms. These may be different for everyone.  Eat small meals often instead of 3 large meals a day.  Eat your meals slowly, in a place where you are relaxed.  Limit fried foods.  Cook foods using methods other than frying.  Avoid drinking alcohol.  Avoid drinking large amounts of liquids with your meals.  Avoid bending over or lying down until 2-3 hours after eating. What foods are not recommended? These are some foods and drinks that may make your symptoms worse: Vegetables Tomatoes. Tomato juice. Tomato and spaghetti sauce. Chili peppers. Onion and garlic. Horseradish. Fruits Oranges, grapefruit, and lemon (fruit and juice). Meats High-fat meats, fish, and poultry. This includes hot dogs, ribs, ham, sausage, salami, and bacon. Dairy Whole milk and chocolate milk. Sour cream. Cream. Butter. Ice cream. Cream  cheese. Drinks Coffee and tea. Bubbly (carbonated) drinks or energy drinks. Condiments Hot sauce. Barbecue sauce. Sweets/Desserts Chocolate and cocoa. Donuts. Peppermint and spearmint. Fats and Oils High-fat foods. This includes [place] fries and potato chips. Other Vinegar. Strong spices. This includes black pepper, white pepper, red pepper, cayenne, curry powder, cloves, ginger, and chili powder. The items listed above may not be a complete list of foods and drinks to avoid. Contact your dietitian for more information. This information is not intended to replace advice given to you by your health care provider. Make sure you discuss any questions you have with your health care provider. Document Released: 08/09/2011 Document Revised: 07/16/2015 Document Reviewed: 12/12/2012 Elsevier Interactive Patient Education  2017 [place] Years, Female Preventive care refers to lifestyle choices and visits with your health care provider that can promote health and wellness. What does preventive care include?  A yearly physical exam. This is also called an annual well check.  Dental exams once or twice a year.  Routine eye exams. Ask your health care provider how often you should have your eyes checked.  Personal lifestyle choices, including: ? Daily care of your teeth and gums. ? Regular physical activity. ? Eating a healthy diet. ? Avoiding tobacco and drug use. ? Limiting alcohol use. ? Practicing safe sex. ? Taking low-dose aspirin daily starting at age 16. ? Taking vitamin and mineral supplements as recommended by your health care provider. What happens during an annual well check? The services and screenings done by your health care provider during your annual well check will  depend on your age, overall health, lifestyle risk factors, and family history of disease. Counseling Your health care provider may ask you questions about your:  Alcohol  use.  Tobacco use.  Drug use.  Emotional well-being.  Home and relationship well-being.  Sexual activity.  Eating habits.  Work and work Statistician.  Method of birth control.  Menstrual cycle.  Pregnancy history.  Screening You may have the following tests or measurements:  Height, weight, and BMI.  Blood pressure.  Lipid and cholesterol levels. These may be checked every 5 years, or more frequently if you are over 97 years old.  Skin check.  Lung cancer screening. You may have this screening every year starting at age 26 if you have a 30-pack-year history of smoking and currently smoke or have quit within the past 15 years.  Fecal occult blood test (FOBT) of the stool. You may have this test every year starting at age 65.  Flexible sigmoidoscopy or colonoscopy. You may have a sigmoidoscopy every 5 years or a colonoscopy every 10 years starting at age 46.  Hepatitis C blood test.  Hepatitis B blood test.  Sexually transmitted disease (STD) testing.  Diabetes screening. This is done by checking your blood sugar (glucose) after you have not eaten for a while (fasting). You may have this done every 1-3 years.  Mammogram. This may be done every 1-2 years. Talk to your health care provider about when you should start having regular mammograms. This may depend on whether you have a family history of breast cancer.  BRCA-related cancer screening. This may be done if you have a family history of breast, ovarian, tubal, or peritoneal cancers.  Pelvic exam and Pap test. This may be done every 3 years starting at age 23. Starting at age 57, this may be done every 5 years if you have a Pap test in combination with an HPV test.  Bone density scan. This is done to screen for osteoporosis. You may have this scan if you are at high risk for osteoporosis.  Discuss your test results, treatment options, and if necessary, the need for more tests with your health care  provider. Vaccines Your health care provider may recommend certain vaccines, such as:  Influenza vaccine. This is recommended every year.  Tetanus, diphtheria, and acellular pertussis (Tdap, Td) vaccine. You may need a Td booster every 10 years.  Varicella vaccine. You may need this if you have not been vaccinated.  Zoster vaccine. You may need this after age 36.  Measles, mumps, and rubella (MMR) vaccine. You may need at least one dose of MMR if you were born in 1957 or later. You may also need a second dose.  Pneumococcal 13-valent conjugate (PCV13) vaccine. You may need this if you have certain conditions and were not previously vaccinated.  Pneumococcal polysaccharide (PPSV23) vaccine. You may need one or two doses if you smoke cigarettes or if you have certain conditions.  Meningococcal vaccine. You may need this if you have certain conditions.  Hepatitis A vaccine. You may need this if you have certain conditions or if you travel or work in places where you may be exposed to hepatitis A.  Hepatitis B vaccine. You may need this if you have certain conditions or if you travel or work in places where you may be exposed to hepatitis B.  Haemophilus influenzae type b (Hib) vaccine. You may need this if you have certain conditions.  Talk to your health care provider about which  screenings and vaccines you need and how often you need them. This information is not intended to replace advice given to you by your health care provider. Make sure you discuss any questions you have with your health care provider. Document Released: 03/06/2015 Document Revised: 10/28/2015 Document Reviewed: 12/09/2014 Elsevier Interactive Patient Education  2017 [employer].

## 2016-09-26 NOTE — Assessment & Plan Note (Signed)
BP normotensive. Asymptomatic. Repeat labs today.

## 2016-09-26 NOTE — Assessment & Plan Note (Signed)
Will start Prevacid and probiotic.  GERD diet started. Follow-up if not resolving.

## 2016-09-26 NOTE — Progress Notes (Signed)
Pre visit review using our clinic review tool, if applicable. No additional management support is needed unless otherwise documented below in the visit note. 

## 2016-09-27 ENCOUNTER — Other Ambulatory Visit: Payer: Self-pay | Admitting: *Deleted

## 2016-09-27 ENCOUNTER — Other Ambulatory Visit: Payer: Self-pay | Admitting: Emergency Medicine

## 2016-09-27 DIAGNOSIS — R319 Hematuria, unspecified: Secondary | ICD-10-CM

## 2016-09-27 LAB — COMPREHENSIVE METABOLIC PANEL
ALT: 16 U/L (ref 6–29)
AST: 20 U/L (ref 10–35)
Albumin: 4.1 g/dL (ref 3.6–5.1)
Alkaline Phosphatase: 98 U/L (ref 33–130)
BILIRUBIN TOTAL: 0.7 mg/dL (ref 0.2–1.2)
BUN: 14 mg/dL (ref 7–25)
CO2: 27 mmol/L (ref 20–32)
CREATININE: 0.69 mg/dL (ref 0.50–1.05)
Calcium: 9.2 mg/dL (ref 8.6–10.4)
Chloride: 99 mmol/L (ref 98–110)
Glucose, Bld: 84 mg/dL (ref 65–99)
Potassium: 4.4 mmol/L (ref 3.5–5.3)
SODIUM: 138 mmol/L (ref 135–146)
TOTAL PROTEIN: 6.9 g/dL (ref 6.1–8.1)

## 2016-09-27 LAB — LIPID PANEL
Cholesterol: 213 mg/dL — ABNORMAL HIGH (ref <200)
HDL: 60 mg/dL (ref >50)
LDL Cholesterol: 127 mg/dL — ABNORMAL HIGH (ref <100)
TRIGLYCERIDES: 128 mg/dL (ref <150)
Total CHOL/HDL Ratio: 3.6 Ratio (ref <5.0)
VLDL: 26 mg/dL (ref <30)

## 2016-09-27 LAB — URINALYSIS, ROUTINE W REFLEX MICROSCOPIC
Bilirubin Urine: NEGATIVE
GLUCOSE, UA: NEGATIVE
Ketones, ur: NEGATIVE
LEUKOCYTES UA: NEGATIVE
NITRITE: NEGATIVE
PH: 5.5 (ref 5.0–8.0)
PROTEIN: NEGATIVE
Specific Gravity, Urine: 1.027 (ref 1.001–1.035)

## 2016-09-27 LAB — URINALYSIS, MICROSCOPIC ONLY
Bacteria, UA: NONE SEEN [HPF]
CRYSTALS: NONE SEEN [HPF]
Casts: NONE SEEN [LPF]
WBC UA: NONE SEEN WBC/HPF (ref <=5)
YEAST: NONE SEEN [HPF]

## 2016-09-27 LAB — HEMOGLOBIN A1C
Hgb A1c MFr Bld: 5.4 % (ref <5.7)
MEAN PLASMA GLUCOSE: 108 mg/dL

## 2016-09-27 LAB — TSH: TSH: 1.6 mIU/L

## 2016-09-27 MED ORDER — LANSOPRAZOLE 30 MG PO CPDR: 30.0000 mg | DELAYED_RELEASE_CAPSULE | Freq: Every day | ORAL | 1 refills | Status: DC

## 2016-10-05 ENCOUNTER — Other Ambulatory Visit: Payer: Self-pay | Admitting: Physician Assistant

## 2016-10-05 DIAGNOSIS — Z1239 Encounter for other screening for malignant neoplasm of breast: Secondary | ICD-10-CM

## 2016-10-17 ENCOUNTER — Other Ambulatory Visit (INDEPENDENT_AMBULATORY_CARE_PROVIDER_SITE_OTHER): Payer: Managed Care, Other (non HMO)

## 2016-10-17 DIAGNOSIS — R319 Hematuria, unspecified: Secondary | ICD-10-CM

## 2016-10-17 LAB — URINALYSIS, ROUTINE W REFLEX MICROSCOPIC
Bilirubin Urine: NEGATIVE
Hgb urine dipstick: NEGATIVE
KETONES UR: NEGATIVE
Leukocytes, UA: NEGATIVE
Nitrite: NEGATIVE
PH: 5.5 (ref 5.0–8.0)
Total Protein, Urine: NEGATIVE
UROBILINOGEN UA: 0.2 (ref 0.0–1.0)
Urine Glucose: NEGATIVE

## 2016-11-14 ENCOUNTER — Ambulatory Visit: Payer: Managed Care, Other (non HMO)

## 2016-11-30 ENCOUNTER — Encounter (HOSPITAL_COMMUNITY): Payer: Self-pay | Admitting: Emergency Medicine

## 2016-11-30 ENCOUNTER — Ambulatory Visit (HOSPITAL_COMMUNITY)
Admission: EM | Admit: 2016-11-30 | Discharge: 2016-11-30 | Disposition: A | Discharge status: Home / Self Care | Payer: Managed Care, Other (non HMO) | Attending: Family Medicine | Admitting: Family Medicine

## 2016-11-30 DIAGNOSIS — M778 Other enthesopathies, not elsewhere classified: Secondary | ICD-10-CM

## 2016-11-30 DIAGNOSIS — M779 Enthesopathy, unspecified: Principal | ICD-10-CM

## 2016-11-30 DIAGNOSIS — S5011XA Contusion of right forearm, initial encounter: Secondary | ICD-10-CM

## 2016-11-30 DIAGNOSIS — S46811A Strain of other muscles, fascia and tendons at shoulder and upper arm level, right arm, initial encounter: Secondary | ICD-10-CM
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
## 2016-11-30 NOTE — Discharge Instructions (Signed)
Wear the right wrist splint for the next several days up to a week. Wear the sling off and on for the next 2-3 days. May remove it periodically to move the shoulder aroundto keep it from stiffening up. Apply ice to the shoulder and wrist and forearm for 2 days then switch to heat to the muscles. Maintain ice to the thumb and wrist. Limit use at work. The movements she described at work on the primary cause of your pain.ibuprofen 600 mg every 6-8 hours as needed for pain.

## 2017-03-18 ENCOUNTER — Ambulatory Visit (HOSPITAL_COMMUNITY)
Admission: EM | Admit: 2017-03-18 | Discharge: 2017-03-18 | Disposition: A | Discharge status: Home / Self Care | Payer: Managed Care, Other (non HMO) | Attending: Physician Assistant | Admitting: Physician Assistant

## 2017-03-18 ENCOUNTER — Encounter (HOSPITAL_COMMUNITY): Payer: Self-pay | Admitting: Family Medicine

## 2017-03-18 DIAGNOSIS — J069 Acute upper respiratory infection, unspecified: Secondary | ICD-10-CM | POA: Diagnosis not present

## 2017-03-18 DIAGNOSIS — R05 Cough: Secondary | ICD-10-CM

## 2017-03-18 DIAGNOSIS — J209 Acute bronchitis, unspecified: Secondary | ICD-10-CM

## 2017-03-18 DIAGNOSIS — R059 Cough, unspecified: Secondary | ICD-10-CM

## 2017-03-18 MED ORDER — PREDNISONE 5 MG (21) PO TBPK: ORAL_TABLET | ORAL | 0 refills | Status: DC

## 2017-03-18 MED ORDER — AZITHROMYCIN 250 MG PO TABS: 250.0000 mg | ORAL_TABLET | Freq: Every day | ORAL | 0 refills | Status: DC

## 2017-03-18 MED ORDER — HYDROCODONE-HOMATROPINE 5-1.5 MG/5ML PO SYRP: 5.0000 mL | ORAL_SOLUTION | Freq: Four times a day (QID) | ORAL | 0 refills | Status: DC | PRN

## 2017-03-18 MED ORDER — ALBUTEROL SULFATE HFA 108 (90 BASE) MCG/ACT IN AERS: 1.0000 | INHALATION_SPRAY | Freq: Four times a day (QID) | RESPIRATORY_TRACT | 0 refills | Status: DC | PRN

## 2017-03-18 NOTE — Discharge Instructions (Signed)
You have bronchitis and possible an upper respiratory infection. Due to your wheeze on exam will treat with inhaler. Suggest you use this every 6 hours while awake for the next 24-36 hours. Take prednisone for chest inflammation and antibiotic for infection. I did give you a rx for night time cough. During the day may use Delsym. It will not interfere with your blood pressure. Drink plenty of water and feel better.

## 2017-03-18 NOTE — ED Provider Notes (Signed)
[facility]    CSN: [id number] Arrival date & time: 03/18/17  1628     History   Chief Complaint No chief complaint on file.   HPI Darlene Gonzalez is a 57 y.o. female.   With a history of HTN presents with a 2 week history of cough, congestion, subjective fever, fatigue and mild dyspnea. She has tried multiple over the counter remedies without relief. She feels that her cough is worsening and now giving her headaches. No known exposures are noted.       Past Medical History:  Diagnosis Date  . Adenomatous colon polyp   . Diverticulosis   . Esophageal stricture   . Fluid in knee    Bilateral  . GERD (gastroesophageal reflux disease)   . Hemorrhagic ovarian cyst 01/2006   Right  . Hemorrhoids   . Hiatal hernia   . HTN (hypertension)   . Hyperplastic colon polyp   . Hypokalemia 06/2005   2nd to diuretic   . Osteoarthritis, knee    Bilateral  . Pneumonia   . Pyelonephritis 07/8125   Uncomplicated  . Recurrent boils    Underarms  . Tobacco abuse   . UTI (lower urinary tract infection)   . 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 Screening for HIV without presence of risk factors 08/17/2016  . Insomnia 07/28/2015  . Recurrent knee pain 05/24/2015  . Visit for preventive health examination 01/02/2014  . Screening for ischemic heart disease 01/02/2014  . Screening for osteoporosis 01/02/2014  . Tendinopathy of rotator cuff 01/02/2014  . Primary osteoarthritis of both knees 09/26/2013  . ESOPHAGEAL STRICTURE 10/21/2008  . GERD 10/21/2008  . PERSONAL HX COLONIC POLYPS 10/21/2008  . LEUKORRHEA 01/10/2008  . VULVAR ATROPHY 12/28/2006  . TOBACCO ABUSE 03/07/2006  . Essential hypertension 03/07/2006  . OVARIAN CYST 03/07/2006    Past Surgical History:  Procedure Laterality Date  . ABDOMINAL HYSTERECTOMY  1998   Dr Jodi Mourning for fibroids  . BREAST BIOPSY    . BREAST CYST EXCISION     Right    OB History    No data available       Home Medications    Prior to Admission medications   Medication Sig Start Date End Date Taking? Authorizing Provider  lansoprazole (PREVACID) 30 MG capsule Take 1 capsule (30 mg total) by mouth daily at 12 noon. 09/27/16   Brunetta Jeans, PA-C  lisinopril (PRINIVIL,ZESTRIL) 20 MG tablet Take 1 tablet (20 mg total) by mouth daily. 08/17/16   Brunetta Jeans, PA-C  triamterene-hydrochlorothiazide (MAXZIDE-25) 37.5-25 MG tablet Take 1 tablet by mouth daily. 08/17/16   Brunetta Jeans, PA-C    Family History Family History  Problem Relation Age of Onset  . Hypertension Father        Deceased  . Liver disease Father        Cirrhosis  . Kidney disease Maternal Aunt   . Healthy Mother        Living  . Diabetes Maternal Aunt        x2  . Healthy Sister        x2  . Healthy Brother        x1    Social History Social History   Tobacco Use  . Smoking status: Current Every Day Smoker    Packs/day: 0.50    Years: 30.00    Pack years: 15.00    Types:  Cigarettes  . Smokeless tobacco: Never Used  Substance Use Topics  . Alcohol use: Yes    Alcohol/week: 0.0 oz    Comment: 6-8 beers on weekends  . Drug use: No     Allergies   Patient has no known allergies.   Review of Systems Review of Systems  Constitutional: Positive for fatigue and fever.  HENT: Positive for congestion, postnasal drip, sinus pressure and sinus pain.   Eyes: Negative.   Respiratory: Positive for cough, shortness of breath and wheezing.   Skin: Negative.      Physical Exam Triage Vital Signs ED Triage Vitals  Enc Vitals Group     BP      Pulse      Resp      Temp      Temp src      SpO2      Weight      Height      Head Circumference      Peak Flow      Pain Score      Pain Loc      Pain Edu?      Excl. in [place]?    No data found.  Updated Vital Signs There were no vitals taken for this visit.  Visual Acuity Right Eye Distance:   Left Eye Distance:     Bilateral Distance:    Right Eye Near:   Left Eye Near:    Bilateral Near:     Physical Exam  Constitutional: She is oriented to person, place, and time. She appears well-developed and well-nourished. No distress.  HENT:  Head: Normocephalic.  Right Ear: External ear normal.  Left Ear: External ear normal.  Mouth/Throat: Oropharynx is clear and moist.  Cardiovascular: Normal rate and regular rhythm.  Pulmonary/Chest: Effort normal. She has wheezes.  Noted fine bibasilar wheeze and rhonci, L>R.   Lymphadenopathy:    She has no cervical adenopathy.  Neurological: She is alert and oriented to person, place, and time.  Skin: Skin is warm and dry. She is not diaphoretic.  Psychiatric: Her behavior is normal.  Nursing note and vitals reviewed.    UC Treatments / Results  Labs (all labs ordered are listed, but only abnormal results are displayed) Labs Reviewed - No data to display  EKG  EKG Interpretation None       Radiology No results found.  Procedures Procedures (including critical care time)  Medications Ordered in UC Medications - No data to display   Initial Impression / Assessment and Plan / UC Course  I have reviewed the triage vital signs and the nursing notes.  Pertinent labs & imaging results that were available during my care of the patient were reviewed by me and considered in my medical decision making (see chart for details).   Treat for acute bronchitis with possible bacterial URI. Cover with antibiotics, prednisone, albuterol and coughs suppressant. supportive care is also discussed. FU if worsens or as needed.   Final Clinical Impressions(s) / UC Diagnoses   Final diagnoses:  None    ED Discharge Orders    None       Controlled Substance Prescriptions [place] Controlled Substance Registry consulted? Yes, I have consulted the  Controlled Substances Registry for this patient, and feel the risk/benefit ratio today is favorable for proceeding  with this prescription for a controlled substance.   Bjorn Pippin, PA-C 03/18/17 1828

## 2017-03-18 NOTE — ED Triage Notes (Signed)
Pt here for URI symptoms.  

## 2017-04-28 ENCOUNTER — Other Ambulatory Visit: Payer: Self-pay

## 2017-04-28 ENCOUNTER — Encounter: Payer: Self-pay | Admitting: Physician Assistant

## 2017-04-28 ENCOUNTER — Ambulatory Visit: Payer: Managed Care, Other (non HMO) | Admitting: Physician Assistant

## 2017-04-28 VITALS — BP 138/78 | HR 79 | Temp 98.4°F | Resp 16 | Ht 68.0 in | Wt 197.0 lb

## 2017-04-28 DIAGNOSIS — Z1239 Encounter for other screening for malignant neoplasm of breast: Secondary | ICD-10-CM

## 2017-04-28 DIAGNOSIS — I10 Essential (primary) hypertension: Secondary | ICD-10-CM | POA: Diagnosis not present

## 2017-04-28 DIAGNOSIS — H6503 Acute serous otitis media, bilateral: Secondary | ICD-10-CM

## 2017-04-28 DIAGNOSIS — I6523 Occlusion and stenosis of bilateral carotid arteries: Secondary | ICD-10-CM

## 2017-04-28 DIAGNOSIS — S59911S Unspecified injury of right forearm, sequela: Secondary | ICD-10-CM

## 2017-04-28 NOTE — Patient Instructions (Signed)
Please schedule an appointment with Darlene Gonzalez for fasting labs at the [facility] office.  I have also placed an order for an x-ray to be done at [place] as well.   I will call with these results. Continue BP medications as directed.  Resume low salt, low fat diet.  Work on aerobic exercise to promote weight loss.   You will be contacted for for your screening mammogram and an ultrasound of your carotid arteries.  I will call with these results as well.  Follow-up will be based on lab results.   Start Claritin daily to help with fluid behind the ears. I really recommend a nasal steroid but you are declining today.
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
## 2017-04-30 DIAGNOSIS — S59911A Unspecified injury of right forearm, initial encounter: Secondary | ICD-10-CM | POA: Insufficient documentation

## 2017-04-30 NOTE — Assessment & Plan Note (Signed)
Noted on scan from Dentist. Will scan into chart. No bruits auscultated on exam. Patient to return for fasting lipid panel. Will continue treatment for hypertension. Dietary and exercise recommendations given. Will check carotid dopplers.

## 2017-04-30 NOTE — Assessment & Plan Note (Signed)
Breast exam without abnormal findings. Order for screening 3D mammogram placed.

## 2017-04-30 NOTE — Assessment & Plan Note (Signed)
Elevated from previous measurements on this regimen. Diet and weight gain are major contributors. BP improved on recheck. Continue current regimen. Will work on weight loss and closely monitor.
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
## 2017-04-30 NOTE — Assessment & Plan Note (Signed)
Start Claritin or Xyzal. Discussed use of nasal steroids. Patient wishes to avoid if possible.

## 2017-05-01 ENCOUNTER — Other Ambulatory Visit (INDEPENDENT_AMBULATORY_CARE_PROVIDER_SITE_OTHER): Payer: Managed Care, Other (non HMO)

## 2017-05-01 ENCOUNTER — Other Ambulatory Visit: Payer: Self-pay | Admitting: Physician Assistant

## 2017-05-01 DIAGNOSIS — I10 Essential (primary) hypertension: Secondary | ICD-10-CM

## 2017-05-01 DIAGNOSIS — I6523 Occlusion and stenosis of bilateral carotid arteries: Secondary | ICD-10-CM

## 2017-05-01 LAB — LIPID PANEL
CHOLESTEROL: 206 mg/dL — AB (ref 0–200)
HDL: 65.4 mg/dL (ref >39.00)
LDL CALC: 122 mg/dL — AB (ref 0–99)
NonHDL: 140.13
TRIGLYCERIDES: 91 mg/dL (ref 0.0–149.0)
Total CHOL/HDL Ratio: 3
VLDL: 18.2 mg/dL (ref 0.0–40.0)

## 2017-05-01 LAB — CBC WITH DIFFERENTIAL/PLATELET
BASOS ABS: 0 10*3/uL (ref 0.0–0.1)
Basophils Relative: 0.7 % (ref 0.0–3.0)
EOS ABS: 0.2 10*3/uL (ref 0.0–0.7)
EOS PCT: 3.4 % (ref 0.0–5.0)
HCT: 40.6 % (ref 36.0–46.0)
HEMOGLOBIN: 13.7 g/dL (ref 12.0–15.0)
Lymphocytes Relative: 36.5 % (ref 12.0–46.0)
Lymphs Abs: 2.1 10*3/uL (ref 0.7–4.0)
MCHC: 33.8 g/dL (ref 30.0–36.0)
MCV: 93.4 fl (ref 78.0–100.0)
MONO ABS: 0.4 10*3/uL (ref 0.1–1.0)
Monocytes Relative: 7.7 % (ref 3.0–12.0)
Neutro Abs: 3 10*3/uL (ref 1.4–7.7)
Neutrophils Relative %: 51.7 % (ref 43.0–77.0)
Platelets: 271 10*3/uL (ref 150.0–400.0)
RBC: 4.35 Mil/uL (ref 3.87–5.11)
RDW: 13 % (ref 11.5–15.5)
WBC: 5.7 10*3/uL (ref 4.0–10.5)

## 2017-05-01 LAB — COMPREHENSIVE METABOLIC PANEL
ALBUMIN: 4.4 g/dL (ref 3.5–5.2)
ALK PHOS: 76 U/L (ref 39–117)
ALT: 17 U/L (ref 0–35)
AST: 21 U/L (ref 0–37)
BUN: 9 mg/dL (ref 6–23)
CALCIUM: 9.6 mg/dL (ref 8.4–10.5)
CHLORIDE: 102 meq/L (ref 96–112)
CO2: 32 mEq/L (ref 19–32)
CREATININE: 0.77 mg/dL (ref 0.40–1.20)
GFR: 99.17 mL/min (ref >60.00)
Glucose, Bld: 87 mg/dL (ref 70–99)
POTASSIUM: 4.1 meq/L (ref 3.5–5.1)
Sodium: 140 mEq/L (ref 135–145)
Total Bilirubin: 0.7 mg/dL (ref 0.2–1.2)
Total Protein: 7.7 g/dL (ref 6.0–8.3)

## 2017-05-02 ENCOUNTER — Telehealth: Payer: Self-pay | Admitting: Physician Assistant

## 2017-05-02 DIAGNOSIS — H6503 Acute serous otitis media, bilateral: Secondary | ICD-10-CM

## 2017-05-02 DIAGNOSIS — I10 Essential (primary) hypertension: Secondary | ICD-10-CM

## 2017-05-02 MED ORDER — TRIAMTERENE-HCTZ 37.5-25 MG PO TABS: 1.0000 | ORAL_TABLET | Freq: Every day | ORAL | 1 refills | Status: DC

## 2017-05-02 NOTE — Telephone Encounter (Signed)
Copied from [place] [id number]. Topic: Quick Communication - Rx Refill/Question >> May 02, 2017  8:56 AM Synthia Innocent wrote: Medication: triamterene-hydrochlorothiazide (MAXZIDE-25) 37.5-25 MG tablet,  triamterene-hydrochlorothiazide (MAXZIDE-25) 37.5-25 MG tablet. Would like go move forward with nasal spray  Has the patient contacted their pharmacy? Yes.     (Agent: If no, request that the patient contact the pharmacy for the refill.)   Preferred Pharmacy (with phone number or street name):[place] [place]   Agent: Please be advised that RX refills may take up to 3 business days. We ask that you follow-up with your pharmacy.

## 2017-05-02 NOTE — Telephone Encounter (Addendum)
Patient called, left VM to return call to the office to clarify the request for nasal spray, since this medication is not on the medication profile. Maxide filled.

## 2017-05-03 MED ORDER — FLUTICASONE PROPIONATE 50 MCG/ACT NA SUSP: 2.0000 | Freq: Every day | NASAL | 6 refills | Status: DC

## 2017-05-03 NOTE — Telephone Encounter (Signed)
Pt. requesting "to move forward with nasal spray."  Noted in office note by Elyn Aquas on 04/28/17, nasal steroids were mentioned.  Please review and notify pt. of plan.   Pharm.: [facility] on [address]

## 2017-05-03 NOTE — Telephone Encounter (Signed)
Spoke with provider of nasal steroid. He states she can start Flonase or Nasacort. Rx for Flonase sent to the pharmacy.

## 2017-05-03 NOTE — Addendum Note (Signed)
Addended by: Leonidas Romberg on: 05/03/2017 04:25 PM   Modules accepted: Orders

## 2017-05-05 ENCOUNTER — Other Ambulatory Visit: Payer: Self-pay

## 2017-05-05 DIAGNOSIS — I10 Essential (primary) hypertension: Secondary | ICD-10-CM

## 2017-05-05 MED ORDER — LISINOPRIL 20 MG PO TABS: 20.0000 mg | ORAL_TABLET | Freq: Every day | ORAL | 1 refills | Status: DC

## 2017-05-08 ENCOUNTER — Ambulatory Visit (HOSPITAL_BASED_OUTPATIENT_CLINIC_OR_DEPARTMENT_OTHER): Payer: Managed Care, Other (non HMO)

## 2017-05-08 ENCOUNTER — Other Ambulatory Visit (HOSPITAL_BASED_OUTPATIENT_CLINIC_OR_DEPARTMENT_OTHER): Payer: Managed Care, Other (non HMO)

## 2017-05-11 ENCOUNTER — Telehealth: Payer: Self-pay | Admitting: *Deleted

## 2017-05-11 DIAGNOSIS — Z1239 Encounter for other screening for malignant neoplasm of breast: Secondary | ICD-10-CM

## 2017-05-11 NOTE — Telephone Encounter (Signed)
I'm assuming this is for Bone Density.  It was ordered back in 2015.  Routing to provider to advise.   Copied from [place] [id number]. Topic: General - Other >> May 11, 2017  1:56 PM Carolyn Stare wrote:  Density with [facility] call to say pt call to reschedule appts for orders that are no longer in Epic and is asking if the pt need to have these imaging done and if so to please put put back in Epic

## 2017-05-12 NOTE — Telephone Encounter (Addendum)
The order was for a screening mammogram, which was ordered on the 8th at her last visit and is already scheduled for Monday so I am a bit confused as to why they are saying the orders are needed for this. We will wait for Bone Density at age 57.

## 2017-05-12 NOTE — Telephone Encounter (Signed)
This has been taken care of.  The order was not seen properly on the Imaging side, but it was fixed and patient is scheduled for Monday.

## 2017-05-15 ENCOUNTER — Ambulatory Visit (HOSPITAL_BASED_OUTPATIENT_CLINIC_OR_DEPARTMENT_OTHER)
Admission: RE | Admit: 2017-05-15 | Discharge: 2017-05-15 | Disposition: A | Discharge status: Home / Self Care | Payer: Managed Care, Other (non HMO) | Source: Ambulatory Visit | Attending: Physician Assistant | Admitting: Physician Assistant

## 2017-05-15 DIAGNOSIS — Z1231 Encounter for screening mammogram for malignant neoplasm of breast: Secondary | ICD-10-CM | POA: Insufficient documentation

## 2017-05-15 DIAGNOSIS — Z1239 Encounter for other screening for malignant neoplasm of breast: Secondary | ICD-10-CM

## 2017-05-15 DIAGNOSIS — M79631 Pain in right forearm: Secondary | ICD-10-CM | POA: Diagnosis present

## 2017-05-15 DIAGNOSIS — S59911S Unspecified injury of right forearm, sequela: Secondary | ICD-10-CM | POA: Diagnosis present

## 2017-05-15 DIAGNOSIS — I6523 Occlusion and stenosis of bilateral carotid arteries: Secondary | ICD-10-CM

## 2017-05-15 DIAGNOSIS — X58XXXS Exposure to other specified factors, sequela: Secondary | ICD-10-CM | POA: Diagnosis not present

## 2017-05-18 ENCOUNTER — Ambulatory Visit (HOSPITAL_BASED_OUTPATIENT_CLINIC_OR_DEPARTMENT_OTHER)
Admission: RE | Admit: 2017-05-18 | Discharge: 2017-05-18 | Disposition: A | Discharge status: Home / Self Care | Payer: Managed Care, Other (non HMO) | Source: Ambulatory Visit | Attending: Physician Assistant | Admitting: Physician Assistant

## 2017-05-18 DIAGNOSIS — I6523 Occlusion and stenosis of bilateral carotid arteries: Secondary | ICD-10-CM | POA: Insufficient documentation

## 2017-05-23 ENCOUNTER — Other Ambulatory Visit: Payer: Self-pay

## 2017-05-23 DIAGNOSIS — E7800 Pure hypercholesterolemia, unspecified: Secondary | ICD-10-CM

## 2017-05-23 MED ORDER — ATORVASTATIN CALCIUM 20 MG PO TABS: 20.0000 mg | ORAL_TABLET | Freq: Every day | ORAL | 1 refills | Status: DC

## 2017-05-26 ENCOUNTER — Other Ambulatory Visit: Payer: Self-pay | Admitting: Emergency Medicine

## 2017-05-26 ENCOUNTER — Other Ambulatory Visit: Payer: Self-pay | Admitting: Physician Assistant

## 2017-07-28 NOTE — Progress Notes (Signed)
LM for pt to CB to schedule a lab only visit.

## 2017-11-02 ENCOUNTER — Other Ambulatory Visit: Payer: Self-pay | Admitting: Physician Assistant

## 2017-11-02 DIAGNOSIS — I10 Essential (primary) hypertension: Secondary | ICD-10-CM

## 2017-11-03 ENCOUNTER — Other Ambulatory Visit: Payer: Self-pay | Admitting: Physician Assistant

## 2017-11-03 DIAGNOSIS — I10 Essential (primary) hypertension: Secondary | ICD-10-CM

## 2017-11-06 ENCOUNTER — Ambulatory Visit: Payer: Managed Care, Other (non HMO) | Admitting: Physician Assistant

## 2017-11-06 ENCOUNTER — Encounter: Payer: Self-pay | Admitting: Physician Assistant

## 2017-11-06 ENCOUNTER — Other Ambulatory Visit: Payer: Self-pay

## 2017-11-06 VITALS — BP 122/80 | HR 80 | Temp 98.7°F | Resp 14 | Ht 68.0 in | Wt 186.0 lb

## 2017-11-06 DIAGNOSIS — E785 Hyperlipidemia, unspecified: Secondary | ICD-10-CM

## 2017-11-06 DIAGNOSIS — I10 Essential (primary) hypertension: Secondary | ICD-10-CM | POA: Diagnosis not present

## 2017-11-06 DIAGNOSIS — Z23 Encounter for immunization: Secondary | ICD-10-CM | POA: Diagnosis not present

## 2017-11-06 LAB — COMPREHENSIVE METABOLIC PANEL
ALBUMIN: 4.5 g/dL (ref 3.5–5.2)
ALK PHOS: 85 U/L (ref 39–117)
ALT: 16 U/L (ref 0–35)
AST: 19 U/L (ref 0–37)
BUN: 10 mg/dL (ref 6–23)
CO2: 32 mEq/L (ref 19–32)
Calcium: 9.3 mg/dL (ref 8.4–10.5)
Chloride: 101 mEq/L (ref 96–112)
Creatinine, Ser: 0.72 mg/dL (ref 0.40–1.20)
GFR: 106.97 mL/min (ref >60.00)
Glucose, Bld: 103 mg/dL — ABNORMAL HIGH (ref 70–99)
POTASSIUM: 3.9 meq/L (ref 3.5–5.1)
Sodium: 140 mEq/L (ref 135–145)
TOTAL PROTEIN: 7.3 g/dL (ref 6.0–8.3)
Total Bilirubin: 0.5 mg/dL (ref 0.2–1.2)

## 2017-11-06 LAB — LIPID PANEL
CHOLESTEROL: 212 mg/dL — AB (ref 0–200)
HDL: 52.2 mg/dL (ref >39.00)
LDL Cholesterol: 133 mg/dL — ABNORMAL HIGH (ref 0–99)
NonHDL: 160.19
Total CHOL/HDL Ratio: 4
Triglycerides: 136 mg/dL (ref 0.0–149.0)
VLDL: 27.2 mg/dL (ref 0.0–40.0)

## 2017-11-06 LAB — HEMOGLOBIN A1C: HEMOGLOBIN A1C: 5.9 % (ref 4.6–6.5)

## 2017-11-06 MED ORDER — TRIAMTERENE-HCTZ 37.5-25 MG PO TABS: 1.0000 | ORAL_TABLET | Freq: Every day | ORAL | 1 refills | Status: DC

## 2017-11-06 NOTE — Assessment & Plan Note (Signed)
Flu shot given

## 2017-11-06 NOTE — Assessment & Plan Note (Signed)
BP stable on current medications. Asymptomatic. Will repeat labs today. Continue current regimen

## 2017-11-06 NOTE — Patient Instructions (Signed)
Please go to the lab today for blood work.  I will call you with your results. We will alter treatment regimen(s) if indicated by your results.    Fat and Cholesterol Restricted Diet Getting too much fat and cholesterol in your diet may cause health problems. Following this diet helps keep your fat and cholesterol at normal levels. This can keep you from getting sick. What types of fat should I choose?  Choose monosaturated and polyunsaturated fats. These are found in foods such as olive oil, canola oil, flaxseeds, walnuts, almonds, and seeds.  Eat more omega-3 fats. Good choices include salmon, mackerel, sardines, tuna, flaxseed oil, and ground flaxseeds.  Limit saturated fats. These are in animal products such as meats, butter, and cream. They can also be in plant products such as palm oil, palm kernel oil, and coconut oil.  Avoid foods with partially hydrogenated oils in them. These contain trans fats. Examples of foods that have trans fats are stick margarine, some tub margarines, cookies, crackers, and other baked goods. What general guidelines do I need to follow?  Check food labels. Look for the words "trans fat" and "saturated fat."  When preparing a meal: ? Fill half of your plate with vegetables and green salads. ? Fill one fourth of your plate with whole grains. Look for the word "whole" as the first word in the ingredient list. ? Fill one fourth of your plate with lean protein foods.  Eat more foods that have fiber, like apples, carrots, beans, peas, and barley.  Eat more home-cooked foods. Eat less at restaurants and buffets.  Limit or avoid alcohol.  Limit foods high in starch and sugar.  Limit fried foods.  Cook foods without frying them. Baking, boiling, grilling, and broiling are all great options.  Lose weight if you are overweight. Losing even a small amount of weight can help your overall health. It can also help prevent diseases such as diabetes and heart  disease. What foods can I eat? Grains Whole grains, such as whole wheat or whole grain breads, crackers, cereals, and pasta. Unsweetened oatmeal, bulgur, barley, quinoa, or brown rice. Corn or whole wheat flour tortillas. Vegetables Fresh or frozen vegetables (raw, steamed, roasted, or grilled). Green salads. Fruits All fresh, canned (in natural juice), or frozen fruits. Meat and Other Protein Products Ground beef (85% or leaner), grass-fed beef, or beef trimmed of fat. Skinless chicken or [place]. Ground chicken or [place]. Pork trimmed of fat. All fish and seafood. Eggs. Dried beans, peas, or lentils. Unsalted nuts or seeds. Unsalted canned or dry beans. Dairy Low-fat dairy products, such as skim or 1% milk, 2% or reduced-fat cheeses, low-fat ricotta or cottage cheese, or plain low-fat yogurt. Fats and Oils Tub margarines without trans fats. Light or reduced-fat mayonnaise and salad dressings. Avocado. Olive, canola, sesame, or safflower oils. Natural peanut or almond butter (choose ones without added sugar and oil). The items listed above may not be a complete list of recommended foods or beverages. Contact your dietitian for more options. What foods are not recommended? Grains White bread. White pasta. White rice. Cornbread. Bagels, pastries, and croissants. Crackers that contain trans fat. Vegetables White potatoes. Corn. Creamed or fried vegetables. Vegetables in a cheese sauce. Fruits Dried fruits. Canned fruit in light or heavy syrup. Fruit juice. Meat and Other Protein Products Fatty cuts of meat. Ribs, chicken wings, bacon, sausage, bologna, salami, chitterlings, fatback, hot dogs, bratwurst, and packaged luncheon meats. Liver and organ meats. Dairy Whole or 2% milk,  cream, half-and-half, and cream cheese. Whole milk cheeses. Whole-fat or sweetened yogurt. Full-fat cheeses. Nondairy creamers and whipped toppings. Processed cheese, cheese spreads, or cheese curds. Sweets and  Desserts Corn syrup, sugars, honey, and molasses. Candy. Jam and jelly. Syrup. Sweetened cereals. Cookies, pies, cakes, donuts, muffins, and ice cream. Fats and Oils Butter, stick margarine, lard, shortening, ghee, or bacon fat. Coconut, palm kernel, or palm oils. Beverages Alcohol. Sweetened drinks (such as sodas, lemonade, and fruit drinks or punches). The items listed above may not be a complete list of foods and beverages to avoid. Contact your dietitian for more information. This information is not intended to replace advice given to you by your health care provider. Make sure you discuss any questions you have with your health care provider. Document Released: 08/09/2011 Document Revised: 10/15/2015 Document Reviewed: 05/09/2013 Elsevier Interactive Patient Education  [employer].

## 2017-11-06 NOTE — Progress Notes (Signed)
Patient presents to clinic today for follow-up of hypertension and hyperlipidemia. Patient currently on a regimen of Lisinopril and Maxzide. Has been taking as directed. Patient denies chest pain, palpitations, lightheadedness, dizziness, vision changes or frequent headaches.  BP Readings from Last 3 Encounters:  11/06/17 122/80  04/28/17 138/78  03/18/17 137/87   Patient notes she is not taking her Atorvastatin. Has been working hard on diet an exercise.   Past Medical History:  Diagnosis Date  . Adenomatous colon polyp   . Diverticulosis   . Esophageal stricture   . Fluid in knee    Bilateral  . GERD (gastroesophageal reflux disease)   . Hemorrhagic ovarian cyst 01/2006   Right  . Hemorrhoids   . Hiatal hernia   . HTN (hypertension)   . Hyperplastic colon polyp   . Hypokalemia 06/2005   2nd to diuretic   . Osteoarthritis, knee    Bilateral  . Pneumonia   . Pyelonephritis 03/7780   Uncomplicated  . Recurrent boils    Underarms  . Tobacco abuse   . UTI (lower urinary tract infection)   . 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 lisinopril (PRINIVIL,ZESTRIL) 20 MG tablet TAKE 1 TABLET BY MOUTH ONCE DAILY 90 tablet 1  . atorvastatin (LIPITOR) 20 MG tablet Take 1 tablet (20 mg total) by mouth daily. (Patient not taking: Reported on 11/06/2017) 30 tablet 1  . lansoprazole (PREVACID) 30 MG capsule Take 1 capsule (30 mg total) by mouth daily at 12 noon. (Patient not taking: Reported on 11/06/2017) 90 capsule 1  . triamterene-hydrochlorothiazide (MAXZIDE-25) 37.5-25 MG tablet TAKE 1 TABLET BY MOUTH ONCE DAILY 90 tablet 1   No current facility-administered medications on file prior to visit.     No Known Allergies  Family History  Problem Relation Age of Onset  . Hypertension Father        Deceased  . Liver disease Father        Cirrhosis  . Kidney disease Maternal Aunt   . Healthy Mother    Living  . Diabetes Maternal Aunt        x2  . Healthy Sister        x2  . Healthy Brother        x1    Social History   Socioeconomic History  . Marital status: Single    Spouse name: Not on file  . Number of children: 0  . Years of education: 12th grade  . Highest education level: Not on file  Occupational History  . 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[employer]  Social Needs  . Financial resource strain: Not on file  . Food insecurity:    Worry: Not on file    Inability: Not on file  . Transportation needs:    Medical: Not on file    Non-medical: Not on file  Tobacco Use  . Smoking status: Current Every Day Smoker    Packs/day: 0.50    Years: 30.00    Pack years: 15.00    Types: Cigarettes  . Smokeless tobacco: Never Used  Substance and Sexual Activity  . Alcohol use: Yes    Alcohol/week: 0.0 standard drinks    Comment: 6-8 beers on weekends  . Drug use: No  . Sexual activity: Not on file  Lifestyle  . Physical activity:    Days per week: Not on file    Minutes  per session: Not on file  . Stress: Not on file  Relationships  . Social connections:    Talks on phone: Not on file    Gets together: Not on file    Attends religious service: Not on file    Active member of club or organization: Not on file    Attends meetings of clubs or organizations: Not on file    Relationship status: Not on file  Other Topics Concern  . Not on file  Social History Narrative   Lives by herself, has stable partner   Review of Systems - See HPI.  All other ROS are negative.  BP 122/80   Pulse 80   Temp 98.7 F (37.1 C) (Oral)   Resp 14   Ht 5\' 8"  (1.727 m)   Wt 186 lb (84.4 kg)   SpO2 99%   BMI 28.28 kg/m   Physical Exam  Constitutional: She is oriented to person, place, and time. She appears well-developed and well-nourished.  HENT:  Head: Normocephalic and atraumatic.  Neck: Neck supple.  Cardiovascular: Normal rate, regular rhythm, normal heart sounds  and intact distal pulses.  Pulmonary/Chest: Effort normal.  Neurological: She is alert and oriented to person, place, and time. She displays normal reflexes. No cranial nerve deficit or sensory deficit. She exhibits normal muscle tone. Coordination normal.  Vitals reviewed.  Assessment/Plan: Hyperlipidemia Not currently taking statin. Is working on diet and exercise. Will repeat fasting lipid panel  Essential hypertension BP stable on current medications. Asymptomatic. Will repeat labs today. Continue current regimen  Need for immunization against influenza Flu shot given    Leeanne Rio, PA-C

## 2017-11-06 NOTE — Assessment & Plan Note (Signed)
Not currently taking statin. Is working on diet and exercise. Will repeat fasting lipid panel

## 2017-11-09 ENCOUNTER — Telehealth: Payer: Self-pay | Admitting: Emergency Medicine

## 2017-11-09 ENCOUNTER — Other Ambulatory Visit: Payer: Self-pay | Admitting: Physician Assistant

## 2017-11-09 DIAGNOSIS — E785 Hyperlipidemia, unspecified: Secondary | ICD-10-CM

## 2017-11-09 MED ORDER — AZELASTINE HCL 0.1 % NA SOLN: 2.0000 | Freq: Two times a day (BID) | NASAL | 12 refills | Status: DC

## 2017-11-09 MED ORDER — ATORVASTATIN CALCIUM 20 MG PO TABS: 20.0000 mg | ORAL_TABLET | Freq: Every day | ORAL | 1 refills | Status: DC

## 2017-11-09 NOTE — Telephone Encounter (Signed)
It was an Rx for Astelin which I have sent to her pharmacy. My apologies!

## 2017-11-09 NOTE — Telephone Encounter (Signed)
While giving patient her lab results. Patient states it was discussed at [facility] that she was suppose to get something to help with fluid in ears. Please advise

## 2018-01-01 ENCOUNTER — Ambulatory Visit (INDEPENDENT_AMBULATORY_CARE_PROVIDER_SITE_OTHER): Payer: Managed Care, Other (non HMO) | Admitting: Physician Assistant

## 2018-01-01 ENCOUNTER — Other Ambulatory Visit: Payer: Self-pay

## 2018-01-01 ENCOUNTER — Encounter: Payer: Self-pay | Admitting: Physician Assistant

## 2018-01-01 VITALS — BP 112/60 | HR 80 | Temp 98.2°F | Resp 16 | Ht 68.0 in | Wt 191.0 lb

## 2018-01-01 DIAGNOSIS — B9689 Other specified bacterial agents as the cause of diseases classified elsewhere: Secondary | ICD-10-CM | POA: Diagnosis not present

## 2018-01-01 DIAGNOSIS — J019 Acute sinusitis, unspecified: Secondary | ICD-10-CM | POA: Diagnosis not present

## 2018-01-01 MED ORDER — DOXYCYCLINE HYCLATE 100 MG PO CAPS: 100.0000 mg | ORAL_CAPSULE | Freq: Two times a day (BID) | ORAL | 0 refills | Status: DC

## 2018-01-01 MED ORDER — PREDNISONE 10 MG PO TABS: ORAL_TABLET | ORAL | 0 refills | Status: AC

## 2018-01-01 MED ORDER — FLUCONAZOLE 150 MG PO TABS: 150.0000 mg | ORAL_TABLET | Freq: Once | ORAL | 0 refills | Status: AC

## 2018-01-01 NOTE — Patient Instructions (Signed)
Please take antibiotic as directed.  Increase fluid intake.  Use Saline nasal spray.  Take a daily multivitamin. Start Delsym or Mucinex-DM for cough. Use the steroid as directed.  Place a humidifier in the bedroom.  Please call or return clinic if symptoms are not improving.  Sinusitis Sinusitis is redness, soreness, and swelling (inflammation) of the paranasal sinuses. Paranasal sinuses are air pockets within the bones of your face (beneath the eyes, the middle of the forehead, or above the eyes). In healthy paranasal sinuses, mucus is able to drain out, and air is able to circulate through them by way of your nose. However, when your paranasal sinuses are inflamed, mucus and air can become trapped. This can allow bacteria and other germs to grow and cause infection. Sinusitis can develop quickly and last only a short time (acute) or continue over a long period (chronic). Sinusitis that lasts for more than 12 weeks is considered chronic.  CAUSES  Causes of sinusitis include:  Allergies.  Structural abnormalities, such as displacement of the cartilage that separates your nostrils (deviated septum), which can decrease the air flow through your nose and sinuses and affect sinus drainage.  Functional abnormalities, such as when the small hairs (cilia) that line your sinuses and help remove mucus do not work properly or are not present. SYMPTOMS  Symptoms of acute and chronic sinusitis are the same. The primary symptoms are pain and pressure around the affected sinuses. Other symptoms include:  Upper toothache.  Earache.  Headache.  Bad breath.  Decreased sense of smell and taste.  A cough, which worsens when you are lying flat.  Fatigue.  Fever.  Thick drainage from your nose, which often is green and may contain pus (purulent).  Swelling and warmth over the affected sinuses. DIAGNOSIS  Your caregiver will perform a physical exam. During the exam, your caregiver may:  Look in your  nose for signs of abnormal growths in your nostrils (nasal polyps).  Tap over the affected sinus to check for signs of infection.  View the inside of your sinuses (endoscopy) with a special imaging device with a light attached (endoscope), which is inserted into your sinuses. If your caregiver suspects that you have chronic sinusitis, one or more of the following tests may be recommended:  Allergy tests.  Nasal culture A sample of mucus is taken from your nose and sent to a lab and screened for bacteria.  Nasal cytology A sample of mucus is taken from your nose and examined by your caregiver to determine if your sinusitis is related to an allergy. TREATMENT  Most cases of acute sinusitis are related to a viral infection and will resolve on their own within 10 days. Sometimes medicines are prescribed to help relieve symptoms (pain medicine, decongestants, nasal steroid sprays, or saline sprays).  However, for sinusitis related to a bacterial infection, your caregiver will prescribe antibiotic medicines. These are medicines that will help kill the bacteria causing the infection.  Rarely, sinusitis is caused by a fungal infection. In theses cases, your caregiver will prescribe antifungal medicine. For some cases of chronic sinusitis, surgery is needed. Generally, these are cases in which sinusitis recurs more than 3 times per year, despite other treatments. HOME CARE INSTRUCTIONS   Drink plenty of water. Water helps thin the mucus so your sinuses can drain more easily.  Use a humidifier.  Inhale steam 3 to 4 times a day (for example, sit in the bathroom with the shower running).  Apply a warm,  moist washcloth to your face 3 to 4 times a day, or as directed by your caregiver.  Use saline nasal sprays to help moisten and clean your sinuses.  Take over-the-counter or prescription medicines for pain, discomfort, or fever only as directed by your caregiver. SEEK IMMEDIATE MEDICAL CARE  IF:  You have increasing pain or severe headaches.  You have nausea, vomiting, or drowsiness.  You have swelling around your face.  You have vision problems.  You have a stiff neck.  You have difficulty breathing. MAKE SURE YOU:   Understand these instructions.  Will watch your condition.  Will get help right away if you are not doing well or get worse. Document Released: 02/07/2005 Document Revised: 05/02/2011 Document Reviewed: 02/22/2011 [facility] Patient Information 2014 [place], [place].

## 2018-01-01 NOTE — Progress Notes (Signed)
Patient presents to clinic today c/o 2 weeks of nasal congestion, sinus pressure and maxillary sinus pain. Notes cough that is productive of green sputum. Denies fever, chills, chest pain or SOB. Is taking allergy medications as directed without improvement in symptoms. Otherwise has not taken anything else for symptoms. Denies recent travel or sick contact.   Past Medical History:  Diagnosis Date  . Adenomatous colon polyp   . Diverticulosis   . Esophageal stricture   . Fluid in knee    Bilateral  . GERD (gastroesophageal reflux disease)   . Hemorrhagic ovarian cyst 01/2006   Right  . Hemorrhoids   . Hiatal hernia   . HTN (hypertension)   . Hyperplastic colon polyp   . Hypokalemia 06/2005   2nd to diuretic   . Osteoarthritis, knee    Bilateral  . Pneumonia   . Pyelonephritis 07/5679   Uncomplicated  . Recurrent boils    Underarms  . Tobacco abuse   . UTI (lower urinary tract infection)   . 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 atorvastatin (LIPITOR) 20 MG tablet Take 1 tablet (20 mg total) by mouth daily. 90 tablet 1  . lisinopril (PRINIVIL,ZESTRIL) 20 MG tablet TAKE 1 TABLET BY MOUTH ONCE DAILY 90 tablet 1  . triamterene-hydrochlorothiazide (MAXZIDE-25) 37.5-25 MG tablet TAKE 1 TABLET BY MOUTH ONCE DAILY 90 tablet 1   No current facility-administered medications on file prior to visit.     No Known Allergies  Family History  Problem Relation Age of Onset  . Hypertension Father        Deceased  . Liver disease Father        Cirrhosis  . Kidney disease Maternal Aunt   . Healthy Mother        Living  . Diabetes Maternal Aunt        x2  . Healthy Sister        x2  . Healthy Brother        x1    Social History   Socioeconomic History  . Marital status: Single    Spouse name: Not on file  . Number of children: 0  . Years of education: 12th grade  . Highest education level: Not  on file  Occupational History  . 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 Financial resource strain: Not on file  . Food insecurity:    Worry: Not on file    Inability: Not on file  . Transportation needs:    Medical: Not on file    Non-medical: Not on file  Tobacco Use  . Smoking status: Current Every Day Smoker    Packs/day: 0.50    Years: 30.00    Pack years: 15.00    Types: Cigarettes  . Smokeless tobacco: Never Used  Substance and Sexual Activity  . Alcohol use: Yes    Alcohol/week: 0.0 standard drinks    Comment: 6-8 beers on weekends  . Drug use: No  . Sexual activity: Not on file  Lifestyle  . Physical activity:    Days per week: Not on file    Minutes per session: Not on file  . Stress: Not on file  Relationships  . Social connections:    Talks on phone: Not on file    Gets together: Not on file    Attends religious service: Not on file  Active member of club or organization: Not on file    Attends meetings of clubs or organizations: Not on file    Relationship status: Not on file  Other Topics Concern  . Not on file  Social History Narrative   Lives by herself, has stable partner   Review of Systems - See HPI.  All other ROS are negative.  BP 112/60   Pulse 80   Temp 98.2 F (36.8 C) (Oral)   Resp 16   Ht 5\' 8"  (1.727 m)   Wt 191 lb (86.6 kg)   SpO2 99%   BMI 29.04 kg/m   Physical Exam  Constitutional: She appears well-developed and well-nourished.  HENT:  Head: Normocephalic and atraumatic.  Right Ear: External ear normal.  Left Ear: External ear normal.  Nose: Right sinus exhibits maxillary sinus tenderness and frontal sinus tenderness.  Mouth/Throat: Uvula is midline and oropharynx is clear and moist.  Eyes: Conjunctivae are normal.  Neck: Neck supple.  Cardiovascular: Normal rate, regular rhythm, normal heart sounds and intact distal pulses.  Pulmonary/Chest: Effort normal and breath sounds normal.    Lymphadenopathy:    She has no cervical adenopathy.  Vitals reviewed.   Recent Results (from the past 2160 hour(s))  Comprehensive metabolic panel     Status: Abnormal   Collection Time: 11/06/17  9:05 AM  Result Value Ref Range   Sodium 140 135 - 145 mEq/L   Potassium 3.9 3.5 - 5.1 mEq/L   Chloride 101 96 - 112 mEq/L   CO2 32 19 - 32 mEq/L   Glucose, Bld 103 (H) 70 - 99 mg/dL   BUN 10 6 - 23 mg/dL   Creatinine, Ser 0.72 0.40 - 1.20 mg/dL   Total Bilirubin 0.5 0.2 - 1.2 mg/dL   Alkaline Phosphatase 85 39 - 117 U/L   AST 19 0 - 37 U/L   ALT 16 0 - 35 U/L   Total Protein 7.3 6.0 - 8.3 g/dL   Albumin 4.5 3.5 - 5.2 g/dL   Calcium 9.3 8.4 - 10.5 mg/dL   GFR 106.97 >60.00 mL/min  Lipid panel     Status: Abnormal   Collection Time: 11/06/17  9:05 AM  Result Value Ref Range   Cholesterol 212 (H) 0 - 200 mg/dL    Comment: ATP III Classification       Desirable:  < 200 mg/dL               Borderline High:  200 - 239 mg/dL          High:  > = 240 mg/dL   Triglycerides 136.0 0.0 - 149.0 mg/dL    Comment: Normal:  <150 mg/dLBorderline High:  150 - 199 mg/dL   HDL 52.20 >39.00 mg/dL   VLDL 27.2 0.0 - 40.0 mg/dL   LDL Cholesterol 133 (H) 0 - 99 mg/dL   Total CHOL/HDL Ratio 4     Comment:                Men          Women1/2 Average Risk     3.4          3.3Average Risk          5.0          4.42X Average Risk          9.6          7.13X Average Risk          15.0  11.0                       NonHDL 160.19     Comment: NOTE:  Non-HDL goal should be 30 mg/dL higher than patient's LDL goal (i.e. LDL goal of < 70 mg/dL, would have non-HDL goal of < 100 mg/dL)  Hemoglobin A1c     Status: None   Collection Time: 11/06/17  9:05 AM  Result Value Ref Range   Hgb A1c MFr Bld 5.9 4.6 - 6.5 %    Comment: Glycemic Control Guidelines for People with Diabetes:Non Diabetic:  <6%Goal of Therapy: <7%Additional Action Suggested:  >8%     Assessment/Plan: 1. Acute bacterial sinusitis Rx  Doxycycline.  Increase fluids.  Rest.  Saline nasal spray.  Probiotic.  Mucinex as directed.  Humidifier in bedroom. Diflucan for yeast. Prednisone taper given due to significant ETD.  Call or return to clinic if symptoms are not improving.  - doxycycline (VIBRAMYCIN) 100 MG capsule; Take 1 capsule (100 mg total) by mouth 2 (two) times daily.  Dispense: 20 capsule; Refill: 0 - predniSONE (DELTASONE) 10 MG tablet; Take 3 tablets (30 mg total) by mouth daily with breakfast for 3 days, THEN 2 tablets (20 mg total) daily with breakfast for 3 days, THEN 1 tablet (10 mg total) daily with breakfast for 3 days.  Dispense: 18 tablet; Refill: 0 - fluconazole (DIFLUCAN) 150 MG tablet; Take 1 tablet (150 mg total) by mouth once for 1 dose.  Dispense: 1 tablet; Refill: 0   Leeanne Rio, PA-C

## 2018-05-08 ENCOUNTER — Other Ambulatory Visit: Payer: Self-pay

## 2018-05-08 ENCOUNTER — Encounter: Payer: Self-pay | Admitting: Physician Assistant

## 2018-05-08 ENCOUNTER — Ambulatory Visit: Payer: Managed Care, Other (non HMO) | Admitting: Physician Assistant

## 2018-05-08 VITALS — BP 110/70 | HR 66 | Temp 98.5°F | Resp 16 | Ht 68.0 in | Wt 191.0 lb

## 2018-05-08 DIAGNOSIS — J101 Influenza due to other identified influenza virus with other respiratory manifestations: Secondary | ICD-10-CM | POA: Diagnosis not present

## 2018-05-08 DIAGNOSIS — J0100 Acute maxillary sinusitis, unspecified: Secondary | ICD-10-CM

## 2018-05-08 DIAGNOSIS — H66002 Acute suppurative otitis media without spontaneous rupture of ear drum, left ear: Secondary | ICD-10-CM | POA: Diagnosis not present

## 2018-05-08 LAB — POCT INFLUENZA A/B
Influenza A, POC: POSITIVE — AB
Influenza B, POC: NEGATIVE

## 2018-05-08 MED ORDER — AMOXICILLIN-POT CLAVULANATE 875-125 MG PO TABS: 1.0000 | ORAL_TABLET | Freq: Two times a day (BID) | ORAL | 0 refills | Status: DC

## 2018-05-08 MED ORDER — HYDROCOD POLST-CPM POLST ER 10-8 MG/5ML PO SUER: 5.0000 mL | Freq: Two times a day (BID) | ORAL | 0 refills | Status: DC | PRN

## 2018-05-08 NOTE — Patient Instructions (Addendum)
poctUnfortunately we are outside the window where antiviral medications will help with your Influenza. At lease we know that it is not the coronavirus! You do have a sinus infection and ear infection as well!  Please take the antibiotic as directed with food. Get plenty of rest. Start a saline nasal rinse to flush out nasal passages.  Use the Tussionex for cough. Tylenol for pain.  Stay home until you know you have remained fever free for 24 hours without medicine and respiratory symptoms have calmed down.

## 2018-05-08 NOTE — Progress Notes (Signed)
Patient presents to clinic today c/o 1 week of chest congestion, cough (mostly dry but sometimes productive of clear sputum), headache with sinus pressure, ear popping, fatigue. Endorses maxillary sinus pain. Denies fever but has noted some mild chills. Notes a co-worker who tested positive for the flu. Denies recent travel. Denies exposure to coronavirus.   Past Medical History:  Diagnosis Date  . Adenomatous colon polyp   . Diverticulosis   . Esophageal stricture   . Fluid in knee    Bilateral  . GERD (gastroesophageal reflux disease)   . Hemorrhagic ovarian cyst 01/2006   Right  . Hemorrhoids   . Hiatal hernia   . HTN (hypertension)   . Hyperplastic colon polyp   . Hypokalemia 06/2005   2nd to diuretic   . Osteoarthritis, knee    Bilateral  . Pneumonia   . Pyelonephritis 05/8183   Uncomplicated  . Recurrent boils    Underarms  . Tobacco abuse   . UTI (lower urinary tract infection)   . 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 lisinopril (PRINIVIL,ZESTRIL) 20 MG tablet TAKE 1 TABLET BY MOUTH ONCE DAILY 90 tablet 1  . triamterene-hydrochlorothiazide (MAXZIDE-25) 37.5-25 MG tablet TAKE 1 TABLET BY MOUTH ONCE DAILY 90 tablet 1  . atorvastatin (LIPITOR) 20 MG tablet Take 1 tablet (20 mg total) by mouth daily. (Patient not taking: Reported on 05/08/2018) 90 tablet 1   No current facility-administered medications on file prior to visit.     No Known Allergies  Family History  Problem Relation Age of Onset  . Hypertension Father        Deceased  . Liver disease Father        Cirrhosis  . Kidney disease Maternal Aunt   . Healthy Mother        Living  . Diabetes Maternal Aunt        x2  . Healthy Sister        x2  . Healthy Brother        x1    Social History   Socioeconomic History  . Marital status: Single    Spouse name: Not on file  . Number of children: 0  . Years of education:  12th grade  . Highest education level: Not on file  Occupational History  . 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 Financial resource strain: Not on file  . Food insecurity:    Worry: Not on file    Inability: Not on file  . Transportation needs:    Medical: Not on file    Non-medical: Not on file  Tobacco Use  . Smoking status: Current Every Day Smoker    Packs/day: 0.50    Years: 30.00    Pack years: 15.00    Types: Cigarettes  . Smokeless tobacco: Never Used  Substance and Sexual Activity  . Alcohol use: Yes    Alcohol/week: 0.0 standard drinks    Comment: 6-8 beers on weekends  . Drug use: No  . Sexual activity: Not on file  Lifestyle  . Physical activity:    Days per week: Not on file    Minutes per session: Not on file  . Stress: Not on file  Relationships  . Social connections:    Talks on phone: Not on file    Gets together: Not on file    Attends religious  service: Not on file    Active member of club or organization: Not on file    Attends meetings of clubs or organizations: Not on file    Relationship status: Not on file  Other Topics Concern  . Not on file  Social History Narrative   Lives by herself, has stable partner   Review of Systems - See HPI.  All other ROS are negative.  BP 110/70   Pulse 66   Temp 98.5 F (36.9 C) (Oral)   Resp 16   Ht 5\' 8"  (1.727 m)   Wt 191 lb (86.6 kg)   BMI 29.04 kg/m   Physical Exam Vitals signs reviewed.  Constitutional:      Appearance: Normal appearance.  HENT:     Head: Normocephalic and atraumatic.     Right Ear: Tympanic membrane normal.     Left Ear: Tympanic membrane normal.     Nose:     Right Sinus: Maxillary sinus tenderness present.     Left Sinus: Maxillary sinus tenderness present.     Mouth/Throat:     Mouth: Mucous membranes are moist.  Eyes:     Conjunctiva/sclera: Conjunctivae normal.  Neck:     Musculoskeletal: Neck supple.  Cardiovascular:     Rate  and Rhythm: Normal rate and regular rhythm.     Heart sounds: Normal heart sounds.  Pulmonary:     Effort: Pulmonary effort is normal.     Breath sounds: Normal breath sounds.  Neurological:     General: No focal deficit present.     Mental Status: She is alert and oriented to person, place, and time.  Psychiatric:        Mood and Affect: Mood normal.    Assessment/Plan: 1. Acute non-recurrent maxillary sinusitis Starting out as a viral illness with known exposure to flu. Low risk for coronavirus. Rx Augmentin.  Increase fluids.  Rest.  Saline nasal spray.  Probiotic.  Mucinex as directed.  Humidifier in bedroom.  Call or return to clinic if symptoms are not improving.  - amoxicillin-clavulanate (AUGMENTIN) 875-125 MG tablet; Take 1 tablet by mouth 2 (two) times daily.  Dispense: 14 tablet; Refill: 0  2. Non-recurrent acute suppurative otitis media of left ear without spontaneous rupture of tympanic membrane Rx Augmentin. Supportive measures and OTC medications reviewed. - amoxicillin-clavulanate (AUGMENTIN) 875-125 MG tablet; Take 1 tablet by mouth 2 (two) times daily.  Dispense: 14 tablet; Refill: 0  3. Influenza A + flu. Outside of window for antiviral treatment but wanted to make sure this was + giving current issue with coronavirus pandemia. Supportive measures and OTC medications reviewed. - POCT Influenza A/B   Leeanne Rio, PA-C

## 2018-06-13 ENCOUNTER — Other Ambulatory Visit: Payer: Self-pay | Admitting: Physician Assistant

## 2018-06-13 DIAGNOSIS — I10 Essential (primary) hypertension: Secondary | ICD-10-CM

## 2018-06-14 ENCOUNTER — Other Ambulatory Visit: Payer: Self-pay

## 2018-06-14 DIAGNOSIS — I10 Essential (primary) hypertension: Secondary | ICD-10-CM

## 2018-06-14 MED ORDER — TRIAMTERENE-HCTZ 37.5-25 MG PO TABS: 1.0000 | ORAL_TABLET | Freq: Every day | ORAL | 0 refills | Status: DC

## 2018-06-14 MED ORDER — LISINOPRIL 20 MG PO TABS: 20.0000 mg | ORAL_TABLET | Freq: Every day | ORAL | 0 refills | Status: DC

## 2018-06-14 NOTE — Telephone Encounter (Signed)
LMOVM to call back to schedule appointment

## 2018-06-14 NOTE — Telephone Encounter (Signed)
Patient is due for 6 month follow for BP and Chol. Will call patient to schedule.

## 2018-06-14 NOTE — Telephone Encounter (Signed)
Pt is at work and will call back at 3pm to speak with Patina about appt and medication refill

## 2018-06-15 ENCOUNTER — Encounter: Payer: Self-pay | Admitting: Physician Assistant

## 2018-06-15 ENCOUNTER — Ambulatory Visit (INDEPENDENT_AMBULATORY_CARE_PROVIDER_SITE_OTHER): Payer: Managed Care, Other (non HMO) | Admitting: Physician Assistant

## 2018-06-15 ENCOUNTER — Other Ambulatory Visit: Payer: Self-pay

## 2018-06-15 DIAGNOSIS — I10 Essential (primary) hypertension: Secondary | ICD-10-CM | POA: Diagnosis not present

## 2018-06-15 DIAGNOSIS — E785 Hyperlipidemia, unspecified: Secondary | ICD-10-CM

## 2018-06-15 DIAGNOSIS — G4700 Insomnia, unspecified: Secondary | ICD-10-CM

## 2018-06-15 NOTE — Progress Notes (Signed)
Virtual Visit via Video   I connected with patient on 06/15/18 at  1:20 PM EDT by a video enabled telemedicine application and verified that I am speaking with the correct person using two identifiers.  Location patient: Home Location provider: [facility], Office Persons participating in the virtual visit: Patient, Provider, [place] (Darlene Gonzalez)  I discussed the limitations of evaluation and management by telemedicine and the availability of in person appointments. The patient expressed understanding and agreed to proceed.  Subjective:   HPI:   Patient presents today via [employer] for follow-up of hypertension and to discuss insomnia.   Hypertension: - Patient is currently on a regimen of Lisinopril 20 mg and Maxzide 37.5-25 mg. Is keeping hydrated and trying to watch diet. Has cut back on soda. Patient denies chest pain, palpitations, lightheadedness, dizziness, vision changes or frequent headaches.  BP Readings from Last 3 Encounters:  05/08/18 110/70  01/01/18 112/60  11/06/17 122/80   Insomnia: - intermittent issue but currently having quite a problem. Averaging about 4 hours of sleep per night. Has taken OTC sleep aids without success. Was previously on Ambien which works but does not like the way it makes her feel.  ROS:   See pertinent positives and negatives per HPI.  Patient Active Problem List   Diagnosis Date Noted  . Hyperlipidemia 11/06/2017  . Need for immunization against influenza 11/06/2017  . Breast cancer screening 04/28/2017  . Bilateral acute serous otitis media 04/28/2017  . Carotid atherosclerosis, bilateral 04/28/2017  . Insomnia 07/28/2015  . Screening for ischemic heart disease 01/02/2014  . Screening for osteoporosis 01/02/2014  . Tendinopathy of rotator cuff 01/02/2014  . Primary osteoarthritis of both knees 09/26/2013  . ESOPHAGEAL STRICTURE 10/21/2008  . GERD 10/21/2008  . PERSONAL HX COLONIC POLYPS 10/21/2008  . VULVAR ATROPHY  12/28/2006  . TOBACCO ABUSE 03/07/2006  . Essential hypertension 03/07/2006  . OVARIAN CYST 03/07/2006    Social History   Tobacco Use  . Smoking status: Current Every Day Smoker    Packs/day: 0.50    Years: 30.00    Pack years: 15.00    Types: Cigarettes  . Smokeless tobacco: Never Used  Substance Use Topics  . Alcohol use: Yes    Alcohol/week: 0.0 standard drinks    Comment: 6-8 beers on weekends    Current Outpatient Medications:  .  atorvastatin (LIPITOR) 20 MG tablet, Take 1 tablet (20 mg total) by mouth daily., Disp: 90 tablet, Rfl: 1 .  lisinopril (ZESTRIL) 20 MG tablet, Take 1 tablet (20 mg total) by mouth daily., Disp: 7 tablet, Rfl: 0 .  triamterene-hydrochlorothiazide (MAXZIDE-25) 37.5-25 MG tablet, Take 1 tablet by mouth daily., Disp: 7 tablet, Rfl: 0  No Known Allergies  Objective:   There were no vitals taken for this visit.  Patient is well-developed, well-nourished in no acute distress.  Resting comfortably at home.  Head is normocephalic, atraumatic.  No labored breathing.  Speech is clear and coherent with logical contest.  Patient is alert and oriented at baseline.   Assessment and Plan:    Essential hypertension Has previously done very well on current regimen. Will have labs checked Monday at [facility]. Orders placed. Will check BP at work tonight and message [place] with readings. Will continue current regimen for now.   Hyperlipidemia Due for repeat check. Dietary and exercise recommendations reviewed. Order for lipids placed. She has lab appt Monday at [facility].   Insomnia Will attempt trial of Belsomra 10 mg nightly to help with  sleep. Handout on sleep hygiene practices sent to MyChart. Follow-up 2 weeks.      Leeanne Rio, [place] 06/15/2018

## 2018-06-15 NOTE — Progress Notes (Signed)
I have discussed the procedure for the virtual visit with the patient who has given consent to proceed with assessment and treatment.   Darlene Gonzalez, CMA     

## 2018-06-15 NOTE — Assessment & Plan Note (Signed)
Will attempt trial of Belsomra 10 mg nightly to help with sleep. Handout on sleep hygiene practices sent to MyChart. Follow-up 2 weeks.

## 2018-06-15 NOTE — Assessment & Plan Note (Signed)
Due for repeat check. Dietary and exercise recommendations reviewed. Order for lipids placed. She has lab appt Monday at [facility]

## 2018-06-15 NOTE — Assessment & Plan Note (Signed)
Has previously done very well on current regimen. Will have labs checked Monday at [facility]. Orders placed. Will check BP at work tonight and message [place] with readings. Will continue current regimen for now.

## 2018-06-18 ENCOUNTER — Other Ambulatory Visit (INDEPENDENT_AMBULATORY_CARE_PROVIDER_SITE_OTHER): Payer: Managed Care, Other (non HMO)

## 2018-06-18 ENCOUNTER — Other Ambulatory Visit: Payer: Self-pay | Admitting: Physician Assistant

## 2018-06-18 DIAGNOSIS — E785 Hyperlipidemia, unspecified: Secondary | ICD-10-CM

## 2018-06-18 DIAGNOSIS — I10 Essential (primary) hypertension: Secondary | ICD-10-CM

## 2018-06-18 LAB — COMPREHENSIVE METABOLIC PANEL
ALT: 23 U/L (ref 0–35)
AST: 19 U/L (ref 0–37)
Albumin: 4.5 g/dL (ref 3.5–5.2)
Alkaline Phosphatase: 98 U/L (ref 39–117)
BUN: 13 mg/dL (ref 6–23)
CO2: 29 mEq/L (ref 19–32)
Calcium: 9.3 mg/dL (ref 8.4–10.5)
Chloride: 102 mEq/L (ref 96–112)
Creatinine, Ser: 0.84 mg/dL (ref 0.40–1.20)
GFR: 84.06 mL/min (ref >60.00)
Glucose, Bld: 97 mg/dL (ref 70–99)
Potassium: 4.2 mEq/L (ref 3.5–5.1)
Sodium: 139 mEq/L (ref 135–145)
Total Bilirubin: 0.8 mg/dL (ref 0.2–1.2)
Total Protein: 7.6 g/dL (ref 6.0–8.3)

## 2018-06-18 LAB — CBC WITH DIFFERENTIAL/PLATELET
Basophils Absolute: 0 10*3/uL (ref 0.0–0.1)
Basophils Relative: 0.6 % (ref 0.0–3.0)
Eosinophils Absolute: 0.3 10*3/uL (ref 0.0–0.7)
Eosinophils Relative: 3.8 % (ref 0.0–5.0)
HCT: 41.6 % (ref 36.0–46.0)
Hemoglobin: 14.1 g/dL (ref 12.0–15.0)
Lymphocytes Relative: 37.1 % (ref 12.0–46.0)
Lymphs Abs: 2.5 10*3/uL (ref 0.7–4.0)
MCHC: 33.9 g/dL (ref 30.0–36.0)
MCV: 90.7 fl (ref 78.0–100.0)
Monocytes Absolute: 0.6 10*3/uL (ref 0.1–1.0)
Monocytes Relative: 9.4 % (ref 3.0–12.0)
Neutro Abs: 3.3 10*3/uL (ref 1.4–7.7)
Neutrophils Relative %: 49.1 % (ref 43.0–77.0)
Platelets: 270 10*3/uL (ref 150.0–400.0)
RBC: 4.59 Mil/uL (ref 3.87–5.11)
RDW: 13.4 % (ref 11.5–15.5)
WBC: 6.7 10*3/uL (ref 4.0–10.5)

## 2018-06-18 LAB — LIPID PANEL
Cholesterol: 174 mg/dL (ref 0–200)
HDL: 56.3 mg/dL (ref >39.00)
LDL Cholesterol: 105 mg/dL — ABNORMAL HIGH (ref 0–99)
NonHDL: 118.11
Total CHOL/HDL Ratio: 3
Triglycerides: 65 mg/dL (ref 0.0–149.0)
VLDL: 13 mg/dL (ref 0.0–40.0)

## 2018-06-18 MED ORDER — SUVOREXANT 10 MG PO TABS: 10.0000 mg | ORAL_TABLET | Freq: Every day | ORAL | 1 refills | Status: DC

## 2018-06-18 NOTE — Progress Notes (Signed)
Advised patient that Belsomra was sent to the pharmacy. Insurance should cover medication

## 2018-06-18 NOTE — Progress Notes (Unsigned)
Please let patient know that insurance dose cover the Belsomra and a prescription was sent to her pharmacy for her to take as directed.

## 2018-12-10 ENCOUNTER — Other Ambulatory Visit: Payer: Self-pay | Admitting: Physician Assistant

## 2018-12-10 ENCOUNTER — Ambulatory Visit: Payer: Managed Care, Other (non HMO) | Admitting: Physician Assistant

## 2018-12-10 DIAGNOSIS — I10 Essential (primary) hypertension: Secondary | ICD-10-CM

## 2018-12-10 NOTE — Telephone Encounter (Signed)
Patient has appointment today. Will fill at appointment

## 2018-12-17 ENCOUNTER — Other Ambulatory Visit: Payer: Self-pay

## 2018-12-17 ENCOUNTER — Ambulatory Visit: Payer: Managed Care, Other (non HMO) | Admitting: Physician Assistant

## 2018-12-17 ENCOUNTER — Encounter: Payer: Self-pay | Admitting: Physician Assistant

## 2018-12-17 VITALS — BP 102/60 | HR 90 | Temp 98.2°F | Resp 16 | Ht 68.0 in | Wt 189.0 lb

## 2018-12-17 DIAGNOSIS — R0789 Other chest pain: Secondary | ICD-10-CM

## 2018-12-17 DIAGNOSIS — E785 Hyperlipidemia, unspecified: Secondary | ICD-10-CM | POA: Diagnosis not present

## 2018-12-17 DIAGNOSIS — K219 Gastro-esophageal reflux disease without esophagitis: Secondary | ICD-10-CM

## 2018-12-17 MED ORDER — PANTOPRAZOLE SODIUM 40 MG PO TBEC: 40.0000 mg | DELAYED_RELEASE_TABLET | Freq: Every day | ORAL | 3 refills | Status: DC

## 2018-12-17 NOTE — Patient Instructions (Signed)
Exam looks good today. Your EKG is good and similar to last EKG in 2015.   - I think symptoms are combination of uncontrolled GERD (reflux) and Costochondritis. Please start the diet listed below. Start the Protonix once daily for reflux symptoms. You may want to take an over-the-counter Famotidine (Pepcid) for a few days until the Protonix gets into your system.  - Avoid heavy lifting or overexertion when possible. Can use the Celebrex once daily to help with inflammation. Tylenol for breakthrough pain if needed. Can apply topical Icy hot or Aspercreme to the area. If you note any worsening reflux with the Celebrex, let us know ASAP  - Please schedule an appointment for fasting labs so we can recheck your cholesterol.   - If you note any significant new chest pain, racing heart, lightheadedness or dizziness, or true shortness of breath at rest, please be seen in the ER.    Food Choices for Gastroesophageal Reflux Disease, Adult When you have gastroesophageal reflux disease (GERD), the foods you eat and your eating habits are very important. Choosing the right foods can help ease your discomfort. Think about working with a nutrition specialist (dietitian) to help you make good choices. What are tips for following this plan?  Meals  Choose healthy foods that are low in fat, such as fruits, vegetables, whole grains, low-fat dairy products, and lean meat, fish, and poultry.  Eat small meals often instead of 3 large meals a day. Eat your meals slowly, and in a place where you are relaxed. Avoid bending over or lying down until 2-3 hours after eating.  Avoid eating meals 2-3 hours before bed.  Avoid drinking a lot of liquid with meals.  Cook foods using methods other than frying. Bake, grill, or broil food instead.  Avoid or limit: ? Chocolate. ? Peppermint or spearmint. ? Alcohol. ? Pepper. ? Black and decaffeinated coffee. ? Black and decaffeinated tea. ? Bubbly (carbonated) soft  drinks. ? Caffeinated energy drinks and soft drinks.  Limit high-fat foods such as: ? Fatty meat or fried foods. ? Whole milk, cream, butter, or ice cream. ? Nuts and nut butters. ? Pastries, donuts, and sweets made with butter or shortening.  Avoid foods that cause symptoms. These foods may be different for everyone. Common foods that cause symptoms include: ? Tomatoes. ? Oranges, lemons, and limes. ? Peppers. ? Spicy food. ? Onions and garlic. ? Vinegar. Lifestyle  Maintain a healthy weight. Ask your doctor what weight is healthy for you. If you need to lose weight, work with your doctor to do so safely.  Exercise for at least 30 minutes for 5 or more days each week, or as told by your doctor.  Wear loose-fitting clothes.  Do not smoke. If you need help quitting, ask your doctor.  Sleep with the head of your bed higher than your feet. Use a wedge under the mattress or blocks under the bed frame to raise the head of the bed. Summary  When you have gastroesophageal reflux disease (GERD), food and lifestyle choices are very important in easing your symptoms.  Eat small meals often instead of 3 large meals a day. Eat your meals slowly, and in a place where you are relaxed.  Limit high-fat foods such as fatty meat or fried foods.  Avoid bending over or lying down until 2-3 hours after eating.  Avoid peppermint and spearmint, caffeine, alcohol, and chocolate. This information is not intended to replace advice given to you by your health  care provider. Make sure you discuss any questions you have with your health care provider. Document Released: 08/09/2011 Document Revised: 05/31/2018 Document Reviewed: 03/15/2016 Elsevier Patient Education  2020 [employer].

## 2018-12-17 NOTE — Progress Notes (Signed)
Patient presents to clinic today c/o chest discomfort and shortness of breath x 2 months. Describes chest pain as sharp, intermittent, located under left breast. Does not radiate to left arm, jaw, or back. States it feels similar to prior hiatal hernia discomfort. Also has history of GERD, takes OTC medication daily, unsure if this is contributing to her discomfort. Does have to do heavy lifting and pushing at work. Positional change does affect pain. Pain present at rest. Also notes she experiences shortness of breath at rest and with exertion, not related to chest pain. Notes level of activity does not impact the SOB. Denies struggling for air or wheezing, just some tightness in the chest. Patient continues to smoke.  States that it doesn't feel like her "normal" breathing. Also notes more frequent headaches. Denies nausea, vomiting, constipation, diarrhea, cough, wheezing. Denies recent URI.   Past Medical History:  Diagnosis Date  . Adenomatous colon polyp   . Diverticulosis   . Esophageal stricture   . Fluid in knee    Bilateral  . GERD (gastroesophageal reflux disease)   . Hemorrhagic ovarian cyst 01/2006   Right  . Hemorrhoids   . Hiatal hernia   . HTN (hypertension)   . Hyperplastic colon polyp   . Hypokalemia 06/2005   2nd to diuretic   . Osteoarthritis, knee    Bilateral  . Pneumonia   . Pyelonephritis 0000000   Uncomplicated  . Recurrent boils    Underarms  . Tobacco abuse   . UTI (lower urinary tract infection)   . 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 atorvastatin (LIPITOR) 20 MG tablet Take 1 tablet (20 mg total) by mouth daily. 90 tablet 1  . lisinopril (ZESTRIL) 20 MG tablet Take 1 tablet by mouth once daily 90 tablet 1  . triamterene-hydrochlorothiazide (MAXZIDE-25) 37.5-25 MG tablet Take 1 tablet by mouth once daily 30 tablet 0   No current facility-administered medications on file  prior to visit.     No Known Allergies  Family History  Problem Relation Age of Onset  . Hypertension Father        Deceased  . Liver disease Father        Cirrhosis  . Kidney disease Maternal Aunt   . Healthy Mother        Living  . Diabetes Maternal Aunt        x2  . Healthy Sister        x2  . Healthy Brother        x1    Social History   Socioeconomic History  . Marital status: Single    Spouse name: Not on file  . Number of children: 0  . Years of education: 12th grade  . Highest education level: Not on file  Occupational History  . 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[employer]  . Occupation: Public house manager: [employer]  . Financial resource strain: Not on file  . Food insecurity    Worry: Not on file    Inability: Not on file  . Transportation needs    Medical: Not on file    Non-medical: Not on file  Tobacco Use  . Smoking status: Current Every Day Smoker    Packs/day: 0.50    Years: 30.00    Pack years: 15.00    Types: Cigarettes  . Smokeless tobacco: Never Used  Substance and Sexual Activity  . Alcohol use: Yes    Alcohol/week: 0.0 standard drinks    Comment: 6-8 beers on weekends  . Drug use: No  . Sexual activity: Not on file  Lifestyle  . Physical activity    Days per week: Not on file    Minutes per session: Not on file  . Stress: Not on file  Relationships  . Social Herbalist on phone: Not on file    Gets together: Not on file    Attends religious service: Not on file    Active member of club or organization: Not on file    Attends meetings of clubs or organizations: Not on file    Relationship status: Not on file  Other Topics Concern  . Not on file  Social History Narrative   Lives by herself, has stable partner   Review of Systems - See HPI.  All other ROS are negative.  BP 102/60   Pulse 90   Temp 98.2 F (36.8 C) (Temporal)   Resp 16   Ht 5\' 8"  (1.727 m)   Wt 189 lb (85.7 kg)   SpO2 98%    BMI 28.74 kg/m   Physical Exam Vitals signs reviewed.  Constitutional:      General: She is not in acute distress.    Appearance: She is well-developed. She is not ill-appearing.  HENT:     Head: Normocephalic and atraumatic.  Eyes:     Pupils: Pupils are equal, round, and reactive to light.  Neck:     Musculoskeletal: Neck supple.  Cardiovascular:     Rate and Rhythm: Normal rate and regular rhythm.  No extrasystoles are present.    Heart sounds: Normal heart sounds.  Pulmonary:     Effort: Pulmonary effort is normal.     Breath sounds: Normal breath sounds.  Chest:     Chest wall: Tenderness (Tenderness of L sternal border and  lower ribs. No bony deformity on examination. Some tenderness of L pectorals as well) present.  Abdominal:     General: Bowel sounds are normal.     Palpations: Abdomen is soft. There is no hepatomegaly or mass.     Tenderness: There is abdominal tenderness (LUQ. No rebound or guarding).  Neurological:     General: No focal deficit present.     Mental Status: She is alert and oriented to person, place, and time.  Psychiatric:        Mood and Affect: Mood normal.    Assessment/Plan: 1. Atypical chest pain Suspect combination of costochondritis and GERD. Her EKG today reveals NSR at rate of 77 bpm. No significant change from prior tracings. + chest wall and sternal tenderness without any sign of pericarditis. She has significant GERD symptoms and LUQ pain on exam. Seems that the SOB occurs during GERD flares only so will start treatment for these two things. If not resolving, will proceed with stress testing. Start Celebrex 100 mg QD. Tylenol for breakthrough pain. PPI as noted below. Close follow-up scheduled. Strict ER precautions reviewed with patient.  - EKG 12-Lead  2. Gastroesophageal reflux disease without esophagitis With LUQ tenderness. No control. Will restart patient on PPI -- Rx Protonix 40 mg daily. She is take OTC Pepcid 20 mg for a few  nights until PPI gets into her system. GERD diet reviewed. Handout given. Close follow-up scheduled. If not improving considerably, will obtain H. Pylori testing.  - pantoprazole (PROTONIX) 40 MG tablet; Take  1 tablet (40 mg total) by mouth daily.  Dispense: 30 tablet; Refill: 3  3. Hyperlipidemia, unspecified hyperlipidemia type Not taking medication in 2 months. Has worked on diet and exercise. Will recheck lipids at her request. If still above goal will need to consider alternative statin due to myalgias. - Lipid Profile; Future   Leeanne Rio, PA-C

## 2018-12-18 ENCOUNTER — Other Ambulatory Visit: Payer: Self-pay | Admitting: Physician Assistant

## 2018-12-18 DIAGNOSIS — I10 Essential (primary) hypertension: Secondary | ICD-10-CM

## 2018-12-31 ENCOUNTER — Other Ambulatory Visit (INDEPENDENT_AMBULATORY_CARE_PROVIDER_SITE_OTHER): Payer: Managed Care, Other (non HMO)

## 2018-12-31 ENCOUNTER — Ambulatory Visit: Payer: Managed Care, Other (non HMO) | Admitting: Physician Assistant

## 2018-12-31 DIAGNOSIS — E785 Hyperlipidemia, unspecified: Secondary | ICD-10-CM | POA: Diagnosis not present

## 2018-12-31 LAB — LIPID PANEL
Cholesterol: 193 mg/dL (ref 0–200)
HDL: 55.4 mg/dL (ref >39.00)
LDL Cholesterol: 122 mg/dL — ABNORMAL HIGH (ref 0–99)
NonHDL: 137.24
Total CHOL/HDL Ratio: 3
Triglycerides: 77 mg/dL (ref 0.0–149.0)
VLDL: 15.4 mg/dL (ref 0.0–40.0)

## 2019-01-02 ENCOUNTER — Other Ambulatory Visit: Payer: Self-pay | Admitting: Emergency Medicine

## 2019-01-02 DIAGNOSIS — E785 Hyperlipidemia, unspecified: Secondary | ICD-10-CM

## 2019-01-02 MED ORDER — ATORVASTATIN CALCIUM 20 MG PO TABS: 20.0000 mg | ORAL_TABLET | Freq: Every day | ORAL | 1 refills | Status: DC

## 2019-01-07 ENCOUNTER — Ambulatory Visit: Payer: Managed Care, Other (non HMO) | Admitting: Physician Assistant

## 2019-01-22 ENCOUNTER — Telehealth: Payer: Self-pay | Admitting: Physician Assistant

## 2019-01-22 ENCOUNTER — Other Ambulatory Visit: Payer: Self-pay | Admitting: Emergency Medicine

## 2019-01-22 ENCOUNTER — Encounter: Payer: Managed Care, Other (non HMO) | Admitting: Physician Assistant

## 2019-01-22 DIAGNOSIS — I10 Essential (primary) hypertension: Secondary | ICD-10-CM

## 2019-01-22 MED ORDER — TRIAMTERENE-HCTZ 37.5-25 MG PO TABS: 1.0000 | ORAL_TABLET | Freq: Every day | ORAL | 3 refills | Status: DC

## 2019-01-22 NOTE — Telephone Encounter (Signed)
Pt needs a refill on triamterene-hydrochlorothiazide (MAXZIDE-25) 37.5-25 MG tablet sent to  [facility] on  [address]

## 2019-02-08 ENCOUNTER — Other Ambulatory Visit: Payer: Self-pay

## 2019-02-08 DIAGNOSIS — Z20822 Contact with and (suspected) exposure to covid-19: Secondary | ICD-10-CM

## 2019-02-09 LAB — NOVEL CORONAVIRUS, NAA: SARS-CoV-2, NAA: NOT DETECTED

## 2019-02-11 ENCOUNTER — Encounter: Payer: Self-pay | Admitting: Physician Assistant

## 2019-02-11 ENCOUNTER — Ambulatory Visit (INDEPENDENT_AMBULATORY_CARE_PROVIDER_SITE_OTHER): Payer: Managed Care, Other (non HMO) | Admitting: Physician Assistant

## 2019-02-11 ENCOUNTER — Other Ambulatory Visit: Payer: Self-pay

## 2019-02-11 VITALS — BP 130/80 | HR 83 | Temp 98.3°F | Resp 16 | Ht 69.25 in | Wt 197.4 lb

## 2019-02-11 DIAGNOSIS — E785 Hyperlipidemia, unspecified: Secondary | ICD-10-CM

## 2019-02-11 DIAGNOSIS — N898 Other specified noninflammatory disorders of vagina: Secondary | ICD-10-CM

## 2019-02-11 DIAGNOSIS — Z0000 Encounter for general adult medical examination without abnormal findings: Secondary | ICD-10-CM

## 2019-02-11 DIAGNOSIS — I10 Essential (primary) hypertension: Secondary | ICD-10-CM | POA: Diagnosis not present

## 2019-02-11 DIAGNOSIS — G5603 Carpal tunnel syndrome, bilateral upper limbs: Secondary | ICD-10-CM | POA: Diagnosis not present

## 2019-02-11 DIAGNOSIS — Z23 Encounter for immunization: Secondary | ICD-10-CM

## 2019-02-11 DIAGNOSIS — K219 Gastro-esophageal reflux disease without esophagitis: Secondary | ICD-10-CM | POA: Diagnosis not present

## 2019-02-11 DIAGNOSIS — Z1231 Encounter for screening mammogram for malignant neoplasm of breast: Secondary | ICD-10-CM

## 2019-02-11 DIAGNOSIS — S46812A Strain of other muscles, fascia and tendons at shoulder and upper arm level, left arm, initial encounter: Secondary | ICD-10-CM | POA: Diagnosis not present

## 2019-02-11 DIAGNOSIS — Z0001 Encounter for general adult medical examination with abnormal findings: Secondary | ICD-10-CM

## 2019-02-11 MED ORDER — CYCLOBENZAPRINE HCL 10 MG PO TABS: 10.0000 mg | ORAL_TABLET | Freq: Every day | ORAL | 0 refills | Status: DC

## 2019-02-11 NOTE — Progress Notes (Signed)
Patient presents to clinic today for annual exam.  Patient is fasting for labs.  Patient also with multiple complaints today  Acute Concerns:  Patient endorses pain and tension in her left neck and shoulder region over the past few weeks.  Notes sometimes feel discomfort in her upper arm as well.  Denies chest pain with this.  Denies racing heart or shortness of breath.  Denies trauma or injury but does do heavy lifting at work which feels may be contributing.  Notes some pain with range of motion.  Denies bruising or skin changes.    Patient also notes intermittent pain in wrists and hands bilaterally with occasional numbness and tingling the fingers of hand.  Sometimes will note a mild cramping sensation.  Denies numbness or tingling of forearms or legs.  Chronic Issues: Hypertension --patient is currently on a regimen of triamterene hydrochlorothiazide 37.5-25 mg daily and lisinopril 20 mg daily.  Endorses taking medications as directed and tolerating well.  Is trying to watch her diet.  No regular exercise at present as she works 2 jobs. Patient denies chest pain, palpitations, lightheadedness, dizziness, vision changes or frequent headaches.  BP Readings from Last 3 Encounters:  02/11/19 130/80  12/17/18 102/60  05/08/18 110/70   Hyperlipidemia --patient recently restarted on atorvastatin 20 mg daily.  Tolerating well so far.  GERD --patient currently on a regimen of pantoprazole 40 mg daily.  Tries to avoid trigger foods.  Notes symptoms overall well controlled on this regimen.  Does have occasional breakthrough symptoms and upper abdominal discomfort.  Health Maintenance: Immunizations -- Agrees to flu vaccination today.  Colonoscopy -- UTD Mammogram -- Due. Order placed.   Past Medical History:  Diagnosis Date  . Adenomatous colon polyp   . Diverticulosis   . Esophageal stricture   . Fluid in knee    Bilateral  . GERD (gastroesophageal reflux disease)   . Hemorrhagic  ovarian cyst 01/2006   Right  . Hemorrhoids   . Hiatal hernia   . HTN (hypertension)   . Hyperplastic colon polyp   . Hypokalemia 06/2005   2nd to diuretic   . Osteoarthritis, knee    Bilateral  . Pneumonia   . Pyelonephritis 0/3754   Uncomplicated  . Recurrent boils    Underarms  . Tobacco abuse   . UTI (lower urinary tract infection)   . 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 ABDOMINAL HYSTERECTOMY  1998   Dr Jodi Mourning for fibroids  . BREAST BIOPSY    . BREAST CYST EXCISION     Right    Current Outpatient Medications on File Prior to Visit  Medication Sig Dispense Refill  . atorvastatin (LIPITOR) 20 MG tablet Take 1 tablet (20 mg total) by mouth daily. 90 tablet 1  . lisinopril (ZESTRIL) 20 MG tablet Take 1 tablet by mouth once daily 90 tablet 1  . pantoprazole (PROTONIX) 40 MG tablet Take 1 tablet (40 mg total) by mouth daily. 30 tablet 3  . triamterene-hydrochlorothiazide (MAXZIDE-25) 37.5-25 MG tablet Take 1 tablet by mouth daily. 30 tablet 3   No current facility-administered medications on file prior to visit.    No Known Allergies  Family History  Problem Relation Age of Onset  . Hypertension Father        Deceased  . Liver disease Father        Cirrhosis  . Kidney disease Maternal Aunt   . Healthy Mother  Living  . Diabetes Maternal Aunt        x2  . Healthy Sister        x2  . Healthy Brother        x1    Social History   Socioeconomic History  . Marital status: Single    Spouse name: Not on file  . Number of children: 0  . Years of education: 12th grade  . Highest education level: Not on file  Occupational History  . 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[employer]  . Occupation: Public house manager: [email or web address]  Tobacco Use  . Smoking status: Current Every Day Smoker    Packs/day: 0.50    Years: 30.00    Pack years: 15.00    Types: Cigarettes  . Smokeless tobacco: Never Used  Substance and  Sexual Activity  . Alcohol use: Yes    Alcohol/week: 12.0 standard drinks    Types: 12 Cans of beer per week    Comment: 1 twelve pack a weekend  . Drug use: No  . Sexual activity: Not on file  Other Topics Concern  . Not on file  Social History Narrative   Lives by herself, has stable partner   Social Determinants of Health   Financial Resource Strain:   . Difficulty of Paying Living Expenses: Not on file  Food Insecurity:   . Worried About Charity fundraiser in the Last Year: Not on file  . Ran Out of Food in the Last Year: Not on file  Transportation Needs:   . Lack of Transportation (Medical): Not on file  . Lack of Transportation (Non-Medical): Not on file  Physical Activity:   . Days of Exercise per Week: Not on file  . Minutes of Exercise per Session: Not on file  Stress:   . Feeling of Stress : Not on file  Social Connections:   . Frequency of Communication with Friends and Family: Not on file  . Frequency of Social Gatherings with Friends and Family: Not on file  . Attends Religious Services: Not on file  . Active Member of Clubs or Organizations: Not on file  . Attends Archivist Meetings: Not on file  . Marital Status: Not on file  Intimate Partner Violence:   . Fear of Current or Ex-Partner: Not on file  . Emotionally Abused: Not on file  . Physically Abused: Not on file  . Sexually Abused: Not on file   Review of Systems  Constitutional: Negative for fever and weight loss.  HENT: Negative for ear discharge, ear pain, hearing loss and tinnitus.   Eyes: Negative for blurred vision, double vision, photophobia and pain.  Respiratory: Negative for cough and shortness of breath.   Cardiovascular: Negative for chest pain and palpitations.  Gastrointestinal: Positive for heartburn. Negative for abdominal pain, blood in stool, constipation, diarrhea, melena, nausea and vomiting.  Genitourinary: Negative for dysuria, flank pain, frequency, hematuria and  urgency.  Musculoskeletal: Positive for joint pain and myalgias. Negative for falls.  Neurological: Positive for tingling and sensory change. Negative for dizziness, tremors, speech change, focal weakness, loss of consciousness and headaches.  Endo/Heme/Allergies: Negative for environmental allergies.  Psychiatric/Behavioral: Negative for depression, hallucinations, substance abuse and suicidal ideas. The patient is not nervous/anxious and does not have insomnia.    BP 130/80   Pulse 83   Temp 98.3 F (36.8 C) (Temporal)   Resp 16   Ht 5' 9.25" (1.759 m)   Wt  197 lb 6.4 oz (89.5 kg)   SpO2 97%   BMI 28.94 kg/m   Physical Exam Vitals reviewed.  Constitutional:      Appearance: Normal appearance.  HENT:     Head: Normocephalic and atraumatic.     Right Ear: Tympanic membrane, ear canal and external ear normal.     Left Ear: Tympanic membrane, ear canal and external ear normal.     Nose: Nose normal. No mucosal edema.     Mouth/Throat:     Pharynx: Uvula midline. No oropharyngeal exudate or posterior oropharyngeal erythema.  Eyes:     Conjunctiva/sclera: Conjunctivae normal.     Pupils: Pupils are equal, round, and reactive to light.  Neck:     Thyroid: No thyromegaly.  Cardiovascular:     Rate and Rhythm: Normal rate and regular rhythm.     Heart sounds: Normal heart sounds.  Pulmonary:     Effort: Pulmonary effort is normal. No respiratory distress.     Breath sounds: Normal breath sounds. No wheezing or rales.  Abdominal:     General: Bowel sounds are normal. There is no distension.     Palpations: Abdomen is soft. There is no mass.     Tenderness: There is no abdominal tenderness. There is no guarding or rebound.  Musculoskeletal:     Left shoulder: Tenderness (pain with movement of arm but pain noted in trapezius distribution) present. No swelling, deformity, bony tenderness or crepitus. Normal range of motion. Normal strength. Normal pulse.     Right wrist: Tenderness  present. No swelling, deformity, bony tenderness or snuff box tenderness. Normal range of motion.     Left wrist: Tenderness present. No swelling, deformity, bony tenderness or snuff box tenderness. Normal range of motion.     Cervical back: Neck supple. Muscular tenderness present. No pain with movement or spinous process tenderness.     Comments: + Tinel sign bilaterally with L > R  Lymphadenopathy:     Cervical: No cervical adenopathy.  Skin:    General: Skin is warm and dry.     Findings: No rash.  Neurological:     Mental Status: She is alert and oriented to person, place, and time.     Cranial Nerves: No cranial nerve deficit.     Recent Results (from the past 2160 hour(s))  Lipid Profile     Status: Abnormal   Collection Time: 12/31/18  9:53 AM  Result Value Ref Range   Cholesterol 193 0 - 200 mg/dL    Comment: ATP III Classification       Desirable:  < 200 mg/dL               Borderline High:  200 - 239 mg/dL          High:  > = 240 mg/dL   Triglycerides 77.0 0.0 - 149.0 mg/dL    Comment: Normal:  <150 mg/dLBorderline High:  150 - 199 mg/dL   HDL 55.40 >39.00 mg/dL   VLDL 15.4 0.0 - 40.0 mg/dL   LDL Cholesterol 122 (H) 0 - 99 mg/dL   Total CHOL/HDL Ratio 3     Comment:                Men          Women1/2 Average Risk     3.4          3.3Average Risk          5.0  4.42X Average Risk          9.6          7.13X Average Risk          15.0          11.0                       NonHDL 137.24     Comment: NOTE:  Non-HDL goal should be 30 mg/dL higher than patient's LDL goal (i.e. LDL goal of < 70 mg/dL, would have non-HDL goal of < 100 mg/dL)  Novel Coronavirus, NAA ([facility])     Status: None   Collection Time: 02/08/19  2:51 PM   Specimen: Nasopharyngeal(NP) swabs in vial transport medium   NASOPHARYNGE  SCREENIN  Result Value Ref Range   SARS-CoV-2, NAA Not Detected Not Detected    Comment: This nucleic acid amplification test was developed and its  performance characteristics determined by [employer]. Nucleic acid amplification tests include PCR and TMA. This test has not been FDA cleared or approved. This test has been authorized by FDA under an Emergency Use Authorization (EUA). This test is only authorized for the duration of time the declaration that circumstances exist justifying the authorization of the emergency use of in vitro diagnostic tests for detection of SARS-CoV-2 virus and/or diagnosis of COVID-19 infection under section 564(b)(1) of the Act, 21 U.S.C. 989QJJ-9(E) (1), unless the authorization is terminated or revoked sooner. When diagnostic testing is negative, the possibility of a false negative result should be considered in the context of a patient's recent exposures and the presence of clinical signs and symptoms consistent with COVID-19. An individual without symptoms of COVID-19 and who is not shedding SARS-CoV-2 virus would  expect to have a negative (not detected) result in this assay.     Assessment/Plan: 1. Visit for preventive health examination Depression screen negative. Health Maintenance reviewed. Patient declines any smoking cessation efforts. Preventive schedule discussed and handout given in AVS. Will obtain fasting labs today. - CBC w/Diff - Comp Met (CMET) - Hemoglobin A1c - Lipid Profile - TSH  2. Essential hypertension BP normotensive today.  Asymptomatic.  Continue current medication regimen.  Repeat CMP today. - Comp Met (CMET)  3. Hyperlipidemia, unspecified hyperlipidemia type Recently restarted statin.  Tolerating well.  Dietary and exercise recommendations reviewed.  Repeat fasting labs today. - Hemoglobin A1c - Lipid Profile  4. Gastroesophageal reflux disease without esophagitis Mild epigastric and left upper quadrant tenderness noted on examination without mass, rebound, guarding.  We will have her increase Protonix to twice daily dosing over the next week,  continuing to avoid trigger foods.  She is to follow-up and let us know how she is doing.  5. Need for immunization against influenza - Flu Vaccine QUAD 36+ mos IM  6. Encounter for screening mammogram for malignant neoplasm of breast - Screening Mammogram; Future  7. Vaginal pruritus Referral placed to GYN per patient preference.  No examination of this area today. - Ambulatory referral to Gynecology  8. Strain of left trapezius muscle, initial encounter Start Flexeril 10 mg at night.  Trying to avoid NSAIDs due to her GERD flare.  Supportive measures and OTC medications reviewed.  Follow-up if not improving.  9. Bilateral carpal tunnel syndrome Bilateral wrist braces supplied to patient.  She is to wear at nighttime and when possible throughout the day over the next couple weeks.  Supportive measures reviewed with patient.  OTC medications reviewed.  If not improving will refer to hand surgery.  This visit occurred during the SARS-CoV-2 public health emergency.  Safety protocols were in place, including screening questions prior to the visit, additional usage of staff PPE, and extensive cleaning of exam room while observing appropriate contact time as indicated for disinfecting solutions.     Leeanne Rio, PA-C

## 2019-02-11 NOTE — Patient Instructions (Signed)
Please go to the lab for blood work.   Our office will call you with your results unless you have chosen to receive results via MyChart.  If your blood work is normal we will follow-up each year for physicals and as scheduled for chronic medical problems.  If anything is abnormal we will treat accordingly and get you in for a follow-up.  Please wear the braces at night. Elevate arms while resting. Can apply topical anti-inflammatories like Icy Hot, Aspercreme or Voltaren to the area. If not improving would want specialist input.  For the trapezius spasm/strain -- avoid heavy lifting or overexertion. Take the Cyclobenzaprine at night. Apply heating pad to the area. Follow-up if symptoms are not resolving.   You will be contacted to schedule your mammogram. You will also be contacted by Gynecology.   For reflux. Continue the Pantoprazole but increase to twice daily for the next week to see how this helps. Avoid trigger foods (see diet below)  I would go to [email or web address] to schedule COVID testing on Wednesday if you are concerned about possible exposure at work.     Food Choices for Gastroesophageal Reflux Disease, Adult When you have gastroesophageal reflux disease (GERD), the foods you eat and your eating habits are very important. Choosing the right foods can help ease your discomfort. Think about working with a nutrition specialist (dietitian) to help you make good choices. What are tips for following this plan?  Meals  Choose healthy foods that are low in fat, such as fruits, vegetables, whole grains, low-fat dairy products, and lean meat, fish, and poultry.  Eat small meals often instead of 3 large meals a day. Eat your meals slowly, and in a place where you are relaxed. Avoid bending over or lying down until 2-3 hours after eating.  Avoid eating meals 2-3 hours before bed.  Avoid drinking a lot of liquid with meals.  Cook foods using methods other than frying. Bake,  grill, or broil food instead.  Avoid or limit: ? Chocolate. ? Peppermint or spearmint. ? Alcohol. ? Pepper. ? Black and decaffeinated coffee. ? Black and decaffeinated tea. ? Bubbly (carbonated) soft drinks. ? Caffeinated energy drinks and soft drinks.  Limit high-fat foods such as: ? Fatty meat or fried foods. ? Whole milk, cream, butter, or ice cream. ? Nuts and nut butters. ? Pastries, donuts, and sweets made with butter or shortening.  Avoid foods that cause symptoms. These foods may be different for everyone. Common foods that cause symptoms include: ? Tomatoes. ? Oranges, lemons, and limes. ? Peppers. ? Spicy food. ? Onions and garlic. ? Vinegar. Lifestyle  Maintain a healthy weight. Ask your doctor what weight is healthy for you. If you need to lose weight, work with your doctor to do so safely.  Exercise for at least 30 minutes for 5 or more days each week, or as told by your doctor.  Wear loose-fitting clothes.  Do not smoke. If you need help quitting, ask your doctor.  Sleep with the head of your bed higher than your feet. Use a wedge under the mattress or blocks under the bed frame to raise the head of the bed. Summary  When you have gastroesophageal reflux disease (GERD), food and lifestyle choices are very important in easing your symptoms.  Eat small meals often instead of 3 large meals a day. Eat your meals slowly, and in a place where you are relaxed.  Limit high-fat foods such as fatty meat or fried foods.  Avoid bending over or lying down until 2-3 hours after eating.  Avoid peppermint and spearmint, caffeine, alcohol, and chocolate. This information is not intended to replace advice given to you by your health care provider. Make sure you discuss any questions you have with your health care provider. Document Released: 08/09/2011 Document Revised: 05/31/2018 Document Reviewed: 03/15/2016 Elsevier Patient Education  2020 [employer]  Preventive  Care 49-76 Years Old, Female Preventive care refers to visits with your health care provider and lifestyle choices that can promote health and wellness. This includes:  A yearly physical exam. This may also be called an annual well check.  Regular dental visits and eye exams.  Immunizations.  Screening for certain conditions.  Healthy lifestyle choices, such as eating a healthy diet, getting regular exercise, not using drugs or products that contain nicotine and tobacco, and limiting alcohol use. What can I expect for my preventive care visit? Physical exam Your health care provider will check your:  Height and weight. This may be used to calculate body mass index (BMI), which tells if you are at a healthy weight.  Heart rate and blood pressure.  Skin for abnormal spots. Counseling Your health care provider may ask you questions about your:  Alcohol, tobacco, and drug use.  Emotional well-being.  Home and relationship well-being.  Sexual activity.  Eating habits.  Work and work Statistician.  Method of birth control.  Menstrual cycle.  Pregnancy history. What immunizations do I need?  Influenza (flu) vaccine  This is recommended every year. Tetanus, diphtheria, and pertussis (Tdap) vaccine  You may need a Td booster every 10 years. Varicella (chickenpox) vaccine  You may need this if you have not been vaccinated. Zoster (shingles) vaccine  You may need this after age 20. Measles, mumps, and rubella (MMR) vaccine  You may need at least one dose of MMR if you were born in 1957 or later. You may also need a second dose. Pneumococcal conjugate (PCV13) vaccine  You may need this if you have certain conditions and were not previously vaccinated. Pneumococcal polysaccharide (PPSV23) vaccine  You may need one or two doses if you smoke cigarettes or if you have certain conditions. Meningococcal conjugate (MenACWY) vaccine  You may need this if you have certain  conditions. Hepatitis A vaccine  You may need this if you have certain conditions or if you travel or work in places where you may be exposed to hepatitis A. Hepatitis B vaccine  You may need this if you have certain conditions or if you travel or work in places where you may be exposed to hepatitis B. Haemophilus influenzae type b (Hib) vaccine  You may need this if you have certain conditions. Human papillomavirus (HPV) vaccine  If recommended by your health care provider, you may need three doses over 6 months. You may receive vaccines as individual doses or as more than one vaccine together in one shot (combination vaccines). Talk with your health care provider about the risks and benefits of combination vaccines. What tests do I need? Blood tests  Lipid and cholesterol levels. These may be checked every 5 years, or more frequently if you are over 41 years old.  Hepatitis C test.  Hepatitis B test. Screening  Lung cancer screening. You may have this screening every year starting at age 40 if you have a 30-pack-year history of smoking and currently smoke or have quit within the past 15 years.  Colorectal cancer screening. All adults should have this screening  starting at age 77 and continuing until age 31. Your health care provider may recommend screening at age 20 if you are at increased risk. You will have tests every 1-10 years, depending on your results and the type of screening test.  Diabetes screening. This is done by checking your blood sugar (glucose) after you have not eaten for a while (fasting). You may have this done every 1-3 years.  Mammogram. This may be done every 1-2 years. Talk with your health care provider about when you should start having regular mammograms. This may depend on whether you have a family history of breast cancer.  BRCA-related cancer screening. This may be done if you have a family history of breast, ovarian, tubal, or peritoneal  cancers.  Pelvic exam and Pap test. This may be done every 3 years starting at age 70. Starting at age 75, this may be done every 5 years if you have a Pap test in combination with an HPV test. Other tests  Sexually transmitted disease (STD) testing.  Bone density scan. This is done to screen for osteoporosis. You may have this scan if you are at high risk for osteoporosis. Follow these instructions at home: Eating and drinking  Eat a diet that includes fresh fruits and vegetables, whole grains, lean protein, and low-fat dairy.  Take vitamin and mineral supplements as recommended by your health care provider.  Do not drink alcohol if: ? Your health care provider tells you not to drink. ? You are pregnant, may be pregnant, or are planning to become pregnant.  If you drink alcohol: ? Limit how much you have to 0-1 drink a day. ? Be aware of how much alcohol is in your drink. In the [place] one drink equals one 12 oz bottle of beer (355 mL), one 5 oz glass of wine (148 mL), or one 1 oz glass of hard liquor (44 mL). Lifestyle  Take daily care of your teeth and gums.  Stay active. Exercise for at least 30 minutes on 5 or more days each week.  Do not use any products that contain nicotine or tobacco, such as cigarettes, e-cigarettes, and chewing tobacco. If you need help quitting, ask your health care provider.  If you are sexually active, practice safe sex. Use a condom or other form of birth control (contraception) in order to prevent pregnancy and STIs (sexually transmitted infections).  If told by your health care provider, take low-dose aspirin daily starting at age 25. What's next?  Visit your health care provider once a year for a well check visit.  Ask your health care provider how often you should have your eyes and teeth checked.  Stay up to date on all vaccines. This information is not intended to replace advice given to you by your health care provider. Make sure you  discuss any questions you have with your health care provider. Document Released: 03/06/2015 Document Revised: 10/19/2017 Document Reviewed: 10/19/2017 Elsevier Patient Education  2020 [employer].

## 2019-02-12 LAB — COMPREHENSIVE METABOLIC PANEL
ALT: 16 U/L (ref 0–35)
AST: 20 U/L (ref 0–37)
Albumin: 4.2 g/dL (ref 3.5–5.2)
Alkaline Phosphatase: 100 U/L (ref 39–117)
BUN: 12 mg/dL (ref 6–23)
CO2: 33 mEq/L — ABNORMAL HIGH (ref 19–32)
Calcium: 9.2 mg/dL (ref 8.4–10.5)
Chloride: 103 mEq/L (ref 96–112)
Creatinine, Ser: 0.68 mg/dL (ref 0.40–1.20)
GFR: 107.04 mL/min (ref >60.00)
Glucose, Bld: 75 mg/dL (ref 70–99)
Potassium: 3.9 mEq/L (ref 3.5–5.1)
Sodium: 141 mEq/L (ref 135–145)
Total Bilirubin: 0.6 mg/dL (ref 0.2–1.2)
Total Protein: 6.7 g/dL (ref 6.0–8.3)

## 2019-02-12 LAB — CBC WITH DIFFERENTIAL/PLATELET
Basophils Absolute: 0.1 10*3/uL (ref 0.0–0.1)
Basophils Relative: 0.8 % (ref 0.0–3.0)
Eosinophils Absolute: 0.2 10*3/uL (ref 0.0–0.7)
Eosinophils Relative: 3.2 % (ref 0.0–5.0)
HCT: 39.6 % (ref 36.0–46.0)
Hemoglobin: 13.1 g/dL (ref 12.0–15.0)
Lymphocytes Relative: 30.9 % (ref 12.0–46.0)
Lymphs Abs: 2.2 10*3/uL (ref 0.7–4.0)
MCHC: 33.1 g/dL (ref 30.0–36.0)
MCV: 92.9 fl (ref 78.0–100.0)
Monocytes Absolute: 0.7 10*3/uL (ref 0.1–1.0)
Monocytes Relative: 9.2 % (ref 3.0–12.0)
Neutro Abs: 4 10*3/uL (ref 1.4–7.7)
Neutrophils Relative %: 55.9 % (ref 43.0–77.0)
Platelets: 244 10*3/uL (ref 150.0–400.0)
RBC: 4.26 Mil/uL (ref 3.87–5.11)
RDW: 12.8 % (ref 11.5–15.5)
WBC: 7.2 10*3/uL (ref 4.0–10.5)

## 2019-02-12 LAB — LIPID PANEL
Cholesterol: 155 mg/dL (ref 0–200)
HDL: 52.8 mg/dL (ref >39.00)
LDL Cholesterol: 88 mg/dL (ref 0–99)
NonHDL: 101.9
Total CHOL/HDL Ratio: 3
Triglycerides: 72 mg/dL (ref 0.0–149.0)
VLDL: 14.4 mg/dL (ref 0.0–40.0)

## 2019-02-12 LAB — HEMOGLOBIN A1C: Hgb A1c MFr Bld: 5.9 % (ref 4.6–6.5)

## 2019-02-12 LAB — TSH: TSH: 1.38 u[IU]/mL (ref 0.35–4.50)

## 2019-02-13 ENCOUNTER — Other Ambulatory Visit: Payer: Self-pay

## 2019-02-18 ENCOUNTER — Encounter: Payer: Self-pay | Admitting: Certified Nurse Midwife

## 2019-02-18 ENCOUNTER — Other Ambulatory Visit (HOSPITAL_COMMUNITY)
Admission: RE | Admit: 2019-02-18 | Discharge: 2019-02-18 | Disposition: A | Discharge status: Home / Self Care | Payer: Managed Care, Other (non HMO) | Source: Ambulatory Visit | Attending: Certified Nurse Midwife | Admitting: Certified Nurse Midwife

## 2019-02-18 ENCOUNTER — Ambulatory Visit: Payer: Managed Care, Other (non HMO) | Admitting: Certified Nurse Midwife

## 2019-02-18 ENCOUNTER — Other Ambulatory Visit: Payer: Self-pay

## 2019-02-18 VITALS — BP 120/70 | HR 70 | Temp 97.2°F | Resp 16 | Ht 68.75 in | Wt 193.0 lb

## 2019-02-18 DIAGNOSIS — N763 Subacute and chronic vulvitis: Secondary | ICD-10-CM | POA: Diagnosis not present

## 2019-02-18 DIAGNOSIS — Z124 Encounter for screening for malignant neoplasm of cervix: Secondary | ICD-10-CM | POA: Insufficient documentation

## 2019-02-18 DIAGNOSIS — Z01419 Encounter for gynecological examination (general) (routine) without abnormal findings: Secondary | ICD-10-CM

## 2019-02-18 DIAGNOSIS — Z113 Encounter for screening for infections with a predominantly sexual mode of transmission: Secondary | ICD-10-CM | POA: Diagnosis not present

## 2019-02-18 DIAGNOSIS — Z8679 Personal history of other diseases of the circulatory system: Secondary | ICD-10-CM

## 2019-02-18 NOTE — Progress Notes (Signed)
58 y.o. G3P0030 Single  African American Fe here to establish gyn care and for annual exam. Menopausal symptoms still occurring with  occasional hot flashes. Here as referral from Dr. Hassell Done for vaginal itching and dryness. Using topical medication from dermatology which helps off and on. Saw Dr. Hassell Done for aex and hypertension/cholesterol/diuretic management, all labs done there. Patient has decreased smoking and feeling better. She describes the vaginal itching as a chronic problem she has had for years. It will clear and reoccurs. Has seen dermatology and treats with medication that is a ?steroid cream. "Would like for this to go away". Denies new personal products, but is using antibacterial ivory soap to clean with. No urinary incontinence or pad use. Denies vaginal bleeding. Sexually active and would like vaginal STD screening. No other health concerns today.  No LMP recorded. Supracervical hysterectomy due to fibroids        Sexually active: Yes.    The current method of family planning is diaphragm and status post hysterectomy.    Exercising: Yes.     Smoker:  yes  Review of Systems  Constitutional: Negative.   HENT: Negative.   Eyes: Negative.   Respiratory: Negative.   Cardiovascular: Negative.   Gastrointestinal: Negative.   Genitourinary: Negative.   Musculoskeletal: Negative.   Skin: Positive for itching.       Vaginal itching  Neurological: Negative.   Endo/Heme/Allergies: Negative.   Psychiatric/Behavioral: Negative.     Health Maintenance: Pap:  06-02-11 neg trichomonas present per result (supracervical hysterectomy) History of Abnormal Pap: no MMG:  05-16-17 category b density birads 1:neg Self Breast exams: no Colonoscopy:  2016 BMD:   none TDaP:  2012 Shingles: not done Pneumonia: not done Hep C and HIV: both neg 2018 Labs: if needed   reports that she has been smoking cigarettes. She has a 15.00 pack-year smoking history. She has never used smokeless tobacco.  She reports current alcohol use of about 12.0 standard drinks of alcohol per week. She reports that she does not use drugs.  Past Medical History:  Diagnosis Date  . Adenomatous colon polyp   . Anemia   . Diverticulosis   . Esophageal stricture   . Fluid in knee    Bilateral  . GERD (gastroesophageal reflux disease)   . Hemorrhagic ovarian cyst 01/2006   Right  . Hemorrhoids   . Hiatal hernia   . HTN (hypertension)   . Hyperplastic colon polyp   . Hypokalemia 06/2005   2nd to diuretic   . Osteoarthritis, knee    Bilateral  . Pneumonia   . Pyelonephritis 0000000   Uncomplicated  . Recurrent boils    Underarms  . Tobacco abuse   . UTI (lower urinary tract infection)   . 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 ABDOMINAL HYSTERECTOMY  1998   Dr Jodi Mourning for fibroids  . BREAST BIOPSY    . BREAST CYST EXCISION     Right    Current Outpatient Medications  Medication Sig Dispense Refill  . atorvastatin (LIPITOR) 20 MG tablet Take 1 tablet (20 mg total) by mouth daily. 90 tablet 1  . cyclobenzaprine (FLEXERIL) 10 MG tablet Take 1 tablet (10 mg total) by mouth at bedtime. 15 tablet 0  . lisinopril (ZESTRIL) 20 MG tablet Take 1 tablet by mouth once daily 90 tablet 1  . pantoprazole (PROTONIX) 40 MG tablet Take 1 tablet (40 mg total) by mouth daily. 30 tablet 3  .  triamterene-hydrochlorothiazide (MAXZIDE-25) 37.5-25 MG tablet Take 1 tablet by mouth daily. 30 tablet 3   No current facility-administered medications for this visit.    Family History  Problem Relation Age of Onset  . Hypertension Father        Deceased  . Liver disease Father        Cirrhosis  . Kidney disease Maternal Aunt   . Healthy Mother        Living  . Thyroid disease Mother   . Diabetes Maternal Aunt        x2  . Lung cancer Sister     ROS:  Pertinent items are noted in HPI.  Otherwise, a comprehensive ROS was negative.  Exam:   BP 120/70   Pulse 70    Temp (!) 97.2 F (36.2 C) (Skin)   Resp 16   Ht 5' 8.75" (1.746 m)   Wt 193 lb (87.5 kg)   BMI 28.71 kg/m  Height: 5' 8.75" (174.6 cm) Ht Readings from Last 3 Encounters:  02/18/19 5' 8.75" (1.746 m)  02/11/19 5' 9.25" (1.759 m)  12/17/18 5\' 8"  (1.727 m)    General appearance: alert, cooperative and appears stated age Head: Normocephalic, without obvious abnormality, atraumatic Neck: no adenopathy, supple, symmetrical, trachea midline and thyroid normal to inspection and palpation Lungs: clear to auscultation bilaterally Breasts: normal appearance, no masses or tenderness, No nipple retraction or dimpling, No nipple discharge or bleeding, No axillary or supraclavicular adenopathy Heart: regular rate and rhythm Abdomen: soft, non-tender; no masses,  no organomegaly Extremities: extremities normal, atraumatic, no cyanosis or edema Skin: Skin color, texture, turgor normal. No rashes or lesions Lymph nodes: Cervical, supraclavicular, and axillary nodes normal. No abnormal inguinal nodes palpated Neurologic: Grossly normal   Pelvic: External genitalia:  no lesions, chronic vulvitis appearance with slight firm skin with atrophy noted, from continued suspect steroid use, non tender              Urethra:  normal appearing urethra with no masses, tenderness or lesions              Bartholin's and Skene's: normal                 Vagina: normal appearing vagina with normal color and discharge, no lesions              Cervix: no cervical motion tenderness, no lesions and normal appearance              Pap taken: Yes.   Bimanual Exam:  Uterus:  uterus absent              Adnexa: no mass, fullness, tenderness and adnexa surgically absent               Rectovaginal: Confirms               Anus:  normal sphincter tone, no lesions  Chaperone present: yes  A:  Well Woman with normal exam  Menopausal no HRT, S/P supracervical TAH with BSO  Chronic vulvitis with atrophy  noted  Hypertension/hyperlipidemia,GERD with MD management, all stable per patient  STD screening  P:   Reviewed health and wellness pertinent to exam  Discussed vaginal finding of cervix and pap taken. She should have pap every 3 years if this one is negative.  Discussed vulvar finding and suggested she start with Dove Cream based soap to clean with. Aveeno oatmeal sitz bath for itching. Obtain Aveeno Eczema cream and apply thinly  to external tissue twice daily for itching. Rv 2 weeks to assess.  Continue follow up with PCP as indicated.  Lab: Affirm, GC/Chlamydia, HIV,RPR, Hep C  Pap smear: yes   counseled on breast self exam, mammography screening, STD prevention, HIV risk factors and prevention, feminine hygiene, menopause, adequate intake of calcium and vitamin D, diet and exercise  return annually or prn  An After Visit Summary was printed and given to the patient.

## 2019-02-18 NOTE — Patient Instructions (Signed)

## 2019-02-19 ENCOUNTER — Other Ambulatory Visit: Payer: Self-pay | Admitting: Certified Nurse Midwife

## 2019-02-19 DIAGNOSIS — B9689 Other specified bacterial agents as the cause of diseases classified elsewhere: Secondary | ICD-10-CM

## 2019-02-19 LAB — CYTOLOGY - PAP
Adequacy: ABSENT
Chlamydia: NEGATIVE
Comment: NEGATIVE
Comment: NEGATIVE
Comment: NORMAL
Diagnosis: NEGATIVE
Diagnosis: REACTIVE
High risk HPV: NEGATIVE
Neisseria Gonorrhea: NEGATIVE

## 2019-02-19 LAB — VAGINITIS/VAGINOSIS, DNA PROBE
Candida Species: POSITIVE — AB
Gardnerella vaginalis: POSITIVE — AB
Trichomonas vaginosis: NEGATIVE

## 2019-02-19 LAB — HEPATITIS C ANTIBODY: Hep C Virus Ab: 0.2 s/co ratio (ref 0.0–0.9)

## 2019-02-19 LAB — RPR: RPR Ser Ql: NONREACTIVE

## 2019-02-19 LAB — HIV ANTIBODY (ROUTINE TESTING W REFLEX): HIV Screen 4th Generation wRfx: NONREACTIVE

## 2019-02-19 MED ORDER — METRONIDAZOLE 500 MG PO TABS: 500.0000 mg | ORAL_TABLET | Freq: Two times a day (BID) | ORAL | 0 refills | Status: DC

## 2019-03-04 ENCOUNTER — Ambulatory Visit: Payer: Managed Care, Other (non HMO) | Admitting: Certified Nurse Midwife

## 2019-03-04 ENCOUNTER — Encounter: Payer: Self-pay | Admitting: Certified Nurse Midwife

## 2019-03-04 ENCOUNTER — Other Ambulatory Visit: Payer: Self-pay

## 2019-03-04 VITALS — BP 120/68 | HR 70 | Temp 97.2°F | Resp 16 | Wt 194.0 lb

## 2019-03-04 DIAGNOSIS — N898 Other specified noninflammatory disorders of vagina: Secondary | ICD-10-CM

## 2019-03-04 DIAGNOSIS — N904 Leukoplakia of vulva: Secondary | ICD-10-CM

## 2019-03-04 NOTE — Progress Notes (Signed)
Review of Systems  Constitutional: Negative.   HENT: Negative.   Eyes: Negative.   Respiratory: Negative.   Cardiovascular: Negative.   Gastrointestinal: Negative.   Genitourinary: Negative.   Musculoskeletal: Negative.   Skin:       Vaginal itching  Neurological: Negative.   Endo/Heme/Allergies: Negative.   Psychiatric/Behavioral: Negative.     59 y.o. Single African American female [id number] here for follow up of BV and yeast vaginitis treated with Flagyl 500 mg bid and Monistat every hs x 7 initiated on 02/18/2019. Completed all medication as directed.  Denies any symptoms of vaginal odor or internal itching. Had no problems using medication. Patient still has occasional external vulva itching. Uses Triamcinolone ointment she has had for several years, prescribed by dermatology. Switched soap to Dove cream bar soap and this has also helped. Has not used any the ointment she brought in so we would know what was being use. Feels better since treatment. No other health issues today.    O: Healthy WD,WN female Affect: normal, orientation x 3 Skin:warm and dry Abdomen:soft,non tender no masses Pelvic exam:EXTERNAL GENITALIA: normal appearing vulva except for atrophy appearance and thickened skin noted, but has improved appearance since treatment . VAGINA: no abnormal discharge or lesions and normal appearing white discharge, non tender, no odor, Affirm taken CERVIX: no lesions or cervical motion tenderness UTERUS: absent ADNEXA: no masses palpable and nontender RECTUM: normal appearance, no skin changes  A:History of BV and yeast vaginitis treated with Flagyl and Monistat cream. Improved appearance R/O infection Suspect over use of medication and possible Lichen Sclerosis Normal pelvic exam    P: Discussed findings of improved appearance and less discharge. Lab: affirm Discussed Aveeno Oatmeal bath and Aveeno Eczema cream use if itching returns. Discussed coconut oil for vaginal  dryness.  Questions addressed.  Rv 2-3 weeks for recheck   Labs   Instructions given regarding:  RV

## 2019-03-04 NOTE — Patient Instructions (Signed)
Lichen Sclerosus Lichen sclerosus is a skin problem. It can happen on any part of the body. It happens most often in the anal or genital areas. It can cause itching and discomfort. Treatment can help to control symptoms. It can also help prevent scarring that may lead to other problems. What are the causes? The cause of this condition is not known. It is not passed from one person to another (not contagious). What increases the risk? This condition is more likely to develop in women. It most often occurs after menopause. What are the signs or symptoms? Symptoms of this condition include:  Thin, wrinkled, white areas on the skin.  Thickened white areas on the skin.  Red and swollen patches (lesions) on the skin.  Tears or cracks in the skin.  Bruising.  Blood blisters.  Very bad itching.  Pain, itching, or burning when peeing (urinating).  Trouble pooping (constipation). How is this diagnosed? This condition may be diagnosed with a physical exam. A sample of your skin may also be removed to be looked at under a microscope (biopsy). How is this treated? This condition may be treated with:  Creams or ointments (topical steroids) that are put on the skin in the affected areas. This is the most common treatment.  Medicines that are taken by mouth.  Surgery. This is only needed if the condition is very bad and is causing problems such as scarring. Follow these instructions at home:  Take or use over-the-counter and prescription medicines only as told by your doctor.  Use creams or ointments as told by your doctor.  Do not scratch the affected areas of skin.  If you are a woman, keep the vagina as clean and dry as you can.  Clean the affected area of skin gently with water. Avoid using rough towels or toilet paper.  Keep all follow-up visits as told by your doctor. This is important. Contact a doctor if:  Your redness, swelling, or pain gets worse.  You have fluid,  blood, or pus coming from the area.  You have new red and swollen patches on your skin.  You have a fever.  You have pain during sex. Summary  Lichen sclerosus is a skin problem. It can cause itching and discomfort.  This condition is usually treated with creams or ointments that are put on the skin in the affected areas.  Use medicines only as told by your doctor.  Do not scratch the affected areas of skin.  Keep all follow-up visits as told by your doctor. This is important. This information is not intended to replace advice given to you by your health care provider. Make sure you discuss any questions you have with your health care provider. Document Revised: 06/22/2017 Document Reviewed: 06/22/2017 Elsevier Patient Education  2020 [employer]  

## 2019-03-05 ENCOUNTER — Other Ambulatory Visit: Payer: Self-pay | Admitting: Certified Nurse Midwife

## 2019-03-05 DIAGNOSIS — N760 Acute vaginitis: Secondary | ICD-10-CM

## 2019-03-05 DIAGNOSIS — B9689 Other specified bacterial agents as the cause of diseases classified elsewhere: Secondary | ICD-10-CM

## 2019-03-05 LAB — VAGINITIS/VAGINOSIS, DNA PROBE
Candida Species: NEGATIVE
Gardnerella vaginalis: POSITIVE — AB
Trichomonas vaginosis: NEGATIVE

## 2019-03-05 MED ORDER — CLINDAMYCIN PHOSPHATE 2 % VA CREA: TOPICAL_CREAM | VAGINAL | 0 refills | Status: DC

## 2019-03-06 ENCOUNTER — Telehealth: Payer: Self-pay | Admitting: Certified Nurse Midwife

## 2019-03-06 ENCOUNTER — Telehealth: Payer: Self-pay

## 2019-03-06 NOTE — Telephone Encounter (Signed)
Patient returned call. Ok to leave a detailed message.

## 2019-03-06 NOTE — Telephone Encounter (Signed)
Per patient okay to leave detailed message. Message left for patient to call if any questions.

## 2019-03-06 NOTE — Telephone Encounter (Signed)
Spoke with patient, advised of 03/04/19 affirm results as seen below per Melvia Heaps, CNM. Patient verbalizes understanding and is agreeable.      Hi Darlene Gonzalez, Your vaginal screening still shows BV(gardnerella). I have sent a prescription to your pharmacy for Cleocin cream. You need to use one applicator of medication in vagina every night at bedtime for 7 nights. Your discharge will increase with treatment. This is normal. Just shower off as needed.  Encounter closed.

## 2019-03-06 NOTE — Telephone Encounter (Signed)
Left message for call back.

## 2019-03-06 NOTE — Telephone Encounter (Signed)
Patient says she could not understand the message that was left for her regarding results. Please call after 2:30pm.

## 2019-03-06 NOTE — Telephone Encounter (Signed)
-----   Message from Regina Eck, CNM sent at 03/05/2019  5:07 PM EST ----- Moise Boring, Your vaginal screening still shows BV(gardnerella). I have sent a prescription to your pharmacy for Cleocin cream. You need to use one applicator of medication in vagina every night at bedtime for 7 nights. Your discharge will increase with treatment. This is normal. Just shower off as needed. Debbi

## 2019-03-17 ENCOUNTER — Other Ambulatory Visit: Payer: Self-pay | Admitting: Physician Assistant

## 2019-03-17 DIAGNOSIS — K219 Gastro-esophageal reflux disease without esophagitis: Secondary | ICD-10-CM

## 2019-04-01 ENCOUNTER — Ambulatory Visit: Payer: Managed Care, Other (non HMO)

## 2019-04-22 ENCOUNTER — Ambulatory Visit: Payer: Managed Care, Other (non HMO)

## 2019-04-29 ENCOUNTER — Other Ambulatory Visit: Payer: Self-pay

## 2019-04-29 ENCOUNTER — Ambulatory Visit
Admission: RE | Admit: 2019-04-29 | Discharge: 2019-04-29 | Disposition: A | Discharge status: Home / Self Care | Payer: Managed Care, Other (non HMO) | Source: Ambulatory Visit | Attending: Physician Assistant | Admitting: Physician Assistant

## 2019-04-29 ENCOUNTER — Other Ambulatory Visit: Payer: Self-pay | Admitting: Physician Assistant

## 2019-04-29 DIAGNOSIS — Z1231 Encounter for screening mammogram for malignant neoplasm of breast: Secondary | ICD-10-CM

## 2019-05-13 ENCOUNTER — Encounter: Payer: Self-pay | Admitting: Certified Nurse Midwife

## 2019-05-29 ENCOUNTER — Other Ambulatory Visit: Payer: Self-pay | Admitting: Physician Assistant

## 2019-05-29 DIAGNOSIS — I10 Essential (primary) hypertension: Secondary | ICD-10-CM

## 2019-06-10 ENCOUNTER — Other Ambulatory Visit: Payer: Self-pay

## 2019-06-10 ENCOUNTER — Ambulatory Visit (INDEPENDENT_AMBULATORY_CARE_PROVIDER_SITE_OTHER): Payer: Managed Care, Other (non HMO) | Admitting: Physician Assistant

## 2019-06-10 ENCOUNTER — Encounter: Payer: Self-pay | Admitting: Physician Assistant

## 2019-06-10 VITALS — BP 120/78 | HR 86 | Temp 99.1°F | Resp 16 | Ht 68.75 in | Wt 201.0 lb

## 2019-06-10 DIAGNOSIS — M25561 Pain in right knee: Secondary | ICD-10-CM | POA: Diagnosis not present

## 2019-06-10 DIAGNOSIS — J019 Acute sinusitis, unspecified: Secondary | ICD-10-CM | POA: Diagnosis not present

## 2019-06-10 DIAGNOSIS — B9689 Other specified bacterial agents as the cause of diseases classified elsewhere: Secondary | ICD-10-CM

## 2019-06-10 DIAGNOSIS — S76011A Strain of muscle, fascia and tendon of right hip, initial encounter: Secondary | ICD-10-CM | POA: Diagnosis not present

## 2019-06-10 DIAGNOSIS — R202 Paresthesia of skin: Secondary | ICD-10-CM

## 2019-06-10 DIAGNOSIS — G8929 Other chronic pain: Secondary | ICD-10-CM

## 2019-06-10 MED ORDER — TRIAMCINOLONE ACETONIDE 55 MCG/ACT NA AERO: 2.0000 | INHALATION_SPRAY | Freq: Every day | NASAL | 12 refills | Status: DC

## 2019-06-10 MED ORDER — CELECOXIB 100 MG PO CAPS: 100.0000 mg | ORAL_CAPSULE | Freq: Two times a day (BID) | ORAL | 0 refills | Status: DC

## 2019-06-10 MED ORDER — AMOXICILLIN-POT CLAVULANATE 875-125 MG PO TABS: 1.0000 | ORAL_TABLET | Freq: Two times a day (BID) | ORAL | 0 refills | Status: DC

## 2019-06-10 NOTE — Patient Instructions (Signed)
Please keep hydrated and get plenty of rest. Start the Celebrex twice daily as directed. Tylenol for breakthrough pain. Elevate leg while resting.  I am setting you up with Orthopedics for further assessment of chronic knee pain and hip issues.  I am also setting you up with neurology for the ongoing r-sided paresthesias.   Please take antibiotic as directed.  Increase fluid intake.  Use Saline nasal spray.  Take a daily multivitamin. Use the nasacort as directed.  Place a humidifier in the bedroom.  Please call or return clinic if symptoms are not improving.  Sinusitis Sinusitis is redness, soreness, and swelling (inflammation) of the paranasal sinuses. Paranasal sinuses are air pockets within the bones of your face (beneath the eyes, the middle of the forehead, or above the eyes). In healthy paranasal sinuses, mucus is able to drain out, and air is able to circulate through them by way of your nose. However, when your paranasal sinuses are inflamed, mucus and air can become trapped. This can allow bacteria and other germs to grow and cause infection. Sinusitis can develop quickly and last only a short time (acute) or continue over a long period (chronic). Sinusitis that lasts for more than 12 weeks is considered chronic.  CAUSES  Causes of sinusitis include:  Allergies.  Structural abnormalities, such as displacement of the cartilage that separates your nostrils (deviated septum), which can decrease the air flow through your nose and sinuses and affect sinus drainage.  Functional abnormalities, such as when the small hairs (cilia) that line your sinuses and help remove mucus do not work properly or are not present. SYMPTOMS  Symptoms of acute and chronic sinusitis are the same. The primary symptoms are pain and pressure around the affected sinuses. Other symptoms include:  Upper toothache.  Earache.  Headache.  Bad breath.  Decreased sense of smell and taste.  A cough, which  worsens when you are lying flat.  Fatigue.  Fever.  Thick drainage from your nose, which often is green and may contain pus (purulent).  Swelling and warmth over the affected sinuses. DIAGNOSIS  Your caregiver will perform a physical exam. During the exam, your caregiver may:  Look in your nose for signs of abnormal growths in your nostrils (nasal polyps).  Tap over the affected sinus to check for signs of infection.  View the inside of your sinuses (endoscopy) with a special imaging device with a light attached (endoscope), which is inserted into your sinuses. If your caregiver suspects that you have chronic sinusitis, one or more of the following tests may be recommended:  Allergy tests.  Nasal culture A sample of mucus is taken from your nose and sent to a lab and screened for bacteria.  Nasal cytology A sample of mucus is taken from your nose and examined by your caregiver to determine if your sinusitis is related to an allergy. TREATMENT  Most cases of acute sinusitis are related to a viral infection and will resolve on their own within 10 days. Sometimes medicines are prescribed to help relieve symptoms (pain medicine, decongestants, nasal steroid sprays, or saline sprays).  However, for sinusitis related to a bacterial infection, your caregiver will prescribe antibiotic medicines. These are medicines that will help kill the bacteria causing the infection.  Rarely, sinusitis is caused by a fungal infection. In theses cases, your caregiver will prescribe antifungal medicine. For some cases of chronic sinusitis, surgery is needed. Generally, these are cases in which sinusitis recurs more than 3 times per year,  despite other treatments. HOME CARE INSTRUCTIONS   Drink plenty of water. Water helps thin the mucus so your sinuses can drain more easily.  Use a humidifier.  Inhale steam 3 to 4 times a day (for example, sit in the bathroom with the shower running).  Apply a warm,  moist washcloth to your face 3 to 4 times a day, or as directed by your caregiver.  Use saline nasal sprays to help moisten and clean your sinuses.  Take over-the-counter or prescription medicines for pain, discomfort, or fever only as directed by your caregiver. SEEK IMMEDIATE MEDICAL CARE IF:  You have increasing pain or severe headaches.  You have nausea, vomiting, or drowsiness.  You have swelling around your face.  You have vision problems.  You have a stiff neck.  You have difficulty breathing. MAKE SURE YOU:   Understand these instructions.  Will watch your condition.  Will get help right away if you are not doing well or get worse. Document Released: 02/07/2005 Document Revised: 05/02/2011 Document Reviewed: 02/22/2011 [facility] Patient Information 2014 [place], [place].

## 2019-06-10 NOTE — Progress Notes (Signed)
Patient presents to clinic today to discuss multiple complaints.  Patient endorses occasional swollen sensation of right foot and ankle with discomfort.  Denies trauma or injury.  Is trying to watch her diet.  No change with elevation of the extremity.  Patient also with history of significant osteoarthritis of knees bilaterally.  Noting issue with recurring knee effusions.  Has not had follow-up with orthopedics.  Patient now starting to note some discomfort in her lateral and medial right hip, worse with range of motion.  Denies trauma or injury.  Denies swelling or skin change in this area.  Denies numbness or tingling in the area.  Does have intermittent episodes of numbness in the right upper and right lower extremities.  Notes this comes and goes.  Resolves on its own.  Has not worsened since onset per patient.   Patient also noticing sinus pressure and sinus pain associated with the ear pressure/popping and feeling off balance.  Some occasional sinus headache with this.  Denies fever or chills.  Denies chest congestion or cough.  Denies shortness of breath or chest pain.  Denies recent travel or sick contact.  Past Medical History:  Diagnosis Date  . Adenomatous colon polyp   . Anemia   . Diverticulosis   . Esophageal stricture   . Fluid in knee    Bilateral  . GERD (gastroesophageal reflux disease)   . Hemorrhagic ovarian cyst 01/2006   Right  . Hemorrhoids   . Hiatal hernia   . HTN (hypertension)   . Hyperplastic colon polyp   . Hypokalemia 06/2005   2nd to diuretic   . Osteoarthritis, knee    Bilateral  . Pneumonia   . Pyelonephritis 0000000   Uncomplicated  . Recurrent boils    Underarms  . Tobacco abuse   . UTI (lower urinary tract infection)   . 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 atorvastatin (LIPITOR) 20 MG tablet Take 1 tablet (20 mg total) by mouth daily. 90 tablet 1  . clindamycin  (CLEOCIN) 2 % vaginal cream Insert one applicator full in vagina every night for 7 nights. 40 g 0  . lisinopril (ZESTRIL) 20 MG tablet Take 1 tablet by mouth once daily 90 tablet 1  . pantoprazole (PROTONIX) 40 MG tablet Take 1 tablet by mouth once daily 90 tablet 2  . triamterene-hydrochlorothiazide (MAXZIDE-25) 37.5-25 MG tablet Take 1 tablet by mouth once daily 90 tablet 1   No current facility-administered medications on file prior to visit.    No Known Allergies  Family History  Problem Relation Age of Onset  . Hypertension Father        Deceased  . Liver disease Father        Cirrhosis  . Kidney disease Maternal Aunt   . Healthy Mother        Living  . Thyroid disease Mother   . Diabetes Maternal Aunt        x2  . Lung cancer Sister     Social History   Socioeconomic History  . Marital status: Single    Spouse name: Not on file  . Number of children: 0  . Years of education: 12th grade  . Highest education level: Not on file  Occupational History  . 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[employer]  . 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 Smoking status: Current  Every Day Smoker    Packs/day: 0.50    Years: 30.00    Pack years: 15.00    Types: Cigarettes  . Smokeless tobacco: Never Used  Substance and Sexual Activity  . Alcohol use: Yes    Alcohol/week: 12.0 standard drinks    Types: 12 Cans of beer per week    Comment: 1 twelve pack a weekend  . Drug use: No  . Sexual activity: Yes    Partners: Male    Birth control/protection: Surgical    Comment: hysterectomy  Other Topics Concern  . Not on file  Social History Narrative   Lives by herself, has stable partner   Social Determinants of Radio broadcast assistant Strain:   . Difficulty of Paying Living Expenses:   Food Insecurity:   . Worried About Charity fundraiser in the Last Year:   . Arboriculturist in the Last Year:   Transportation Needs:   . Film/video editor  (Medical):   Marland Kitchen Lack of Transportation (Non-Medical):   Physical Activity:   . Days of Exercise per Week:   . Minutes of Exercise per Session:   Stress:   . Feeling of Stress :   Social Connections:   . Frequency of Communication with Friends and Family:   . Frequency of Social Gatherings with Friends and Family:   . Attends Religious Services:   . Active Member of Clubs or Organizations:   . Attends Archivist Meetings:   Marland Kitchen Marital Status:    Review of Systems - See HPI.  All other ROS are negative.  BP 120/78   Pulse 86   Temp 99.1 F (37.3 C) (Temporal)   Resp 16   Ht 5' 8.75" (1.746 m)   Wt 201 lb (91.2 kg)   SpO2 96%   BMI 29.90 kg/m   Physical Exam Vitals reviewed.  Constitutional:      Appearance: Normal appearance.  HENT:     Head: Normocephalic and atraumatic.     Right Ear: Tympanic membrane normal.     Left Ear: Tympanic membrane normal.     Nose: Congestion present.     Right Sinus: Maxillary sinus tenderness and frontal sinus tenderness present.     Left Sinus: Frontal sinus tenderness present. No maxillary sinus tenderness.     Mouth/Throat:     Mouth: Mucous membranes are moist.  Eyes:     Conjunctiva/sclera: Conjunctivae normal.     Pupils: Pupils are equal, round, and reactive to light.  Musculoskeletal:     Cervical back: Neck supple.     Right hip: Tenderness (tenderness of medial anterior thigh with abduction, IR/OR) present. Normal range of motion.     Right upper leg: Normal.     Right knee: Swelling and effusion present. Tenderness present over the medial joint line and lateral joint line. No patellar tendon tenderness. No LCL laxity or MCL laxity. Normal alignment and normal patellar mobility.     Right lower leg: No swelling. No edema.     Right ankle: No swelling or deformity. Tenderness present over the ATF ligament.     Right foot: No swelling, deformity, tenderness or bony tenderness.  Neurological:     Mental Status: She is  alert.     Assessment/Plan: 1. Acute bacterial sinusitis Rx Augmentin.  Increase fluids.  Rest.  Saline nasal spray.  Probiotic.  Mucinex as directed.  Humidifier in bedroom. Nasacort per orders.  Call or return to clinic  if symptoms are not improving.  - amoxicillin-clavulanate (AUGMENTIN) 875-125 MG tablet; Take 1 tablet by mouth 2 (two) times daily.  Dispense: 14 tablet; Refill: 0 - triamcinolone (NASACORT) 55 MCG/ACT AERO nasal inhaler; Place 2 sprays into the nose daily.  Dispense: 1 Inhaler; Refill: 12  2. Hip strain, right, initial encounter She is being referred back to Orthopedics due to her chronic knee pain. Will have her follow-up with hip pain as well. For now, limit overexertion. Start Celebrex 100 mg BID. Stretching reviewed. She is to let us know ASAP if any new or worsening symptoms.  3. Chronic pain of right knee OA of bilateral knees, right greater than left.  Chronic and recurring effusion with medial and lateral joint line tenderness.  Celebrex is noted.  Recommend knee sleeve, ice and elevation.  Referral back to orthopedics placed. - AMB referral to orthopedics  4. Paresthesias Ongoing.  Mainly right-sided.  Patient likely needs nerve conduction study.  Referral to neurology placed for further assessment. - Ambulatory referral to Neurology  This visit occurred during the SARS-CoV-2 public health emergency.  Safety protocols were in place, including screening questions prior to the visit, additional usage of staff PPE, and extensive cleaning of exam room while observing appropriate contact time as indicated for disinfecting solutions.     Leeanne Rio, PA-C

## 2019-06-11 ENCOUNTER — Encounter: Payer: Self-pay | Admitting: Neurology

## 2019-06-17 ENCOUNTER — Encounter: Payer: Self-pay | Admitting: Orthopaedic Surgery

## 2019-06-17 ENCOUNTER — Ambulatory Visit: Payer: Managed Care, Other (non HMO) | Admitting: Orthopaedic Surgery

## 2019-06-17 ENCOUNTER — Ambulatory Visit (INDEPENDENT_AMBULATORY_CARE_PROVIDER_SITE_OTHER): Payer: Managed Care, Other (non HMO)

## 2019-06-17 ENCOUNTER — Other Ambulatory Visit: Payer: Self-pay

## 2019-06-17 DIAGNOSIS — M25561 Pain in right knee: Secondary | ICD-10-CM

## 2019-06-17 MED ORDER — DICLOFENAC SODIUM 75 MG PO TBEC: 75.0000 mg | DELAYED_RELEASE_TABLET | Freq: Two times a day (BID) | ORAL | 1 refills | Status: DC | PRN

## 2019-06-17 MED ORDER — TRAMADOL HCL 50 MG PO TABS: 50.0000 mg | ORAL_TABLET | Freq: Four times a day (QID) | ORAL | 0 refills | Status: DC | PRN

## 2019-06-17 MED ORDER — METHYLPREDNISOLONE ACETATE 40 MG/ML IJ SUSP
40.0000 mg | INTRAMUSCULAR | Status: AC | PRN
  Administered 2019-06-17: 40 mg via INTRA_ARTICULAR

## 2019-06-17 MED ORDER — LIDOCAINE HCL 1 % IJ SOLN
3.0000 mL | INTRAMUSCULAR | Status: AC | PRN
  Administered 2019-06-17: 3 mL

## 2019-06-17 NOTE — Progress Notes (Signed)
Office Visit Note   Patient: Darlene Gonzalez           Date of Birth: 1961/01/10           MRN: [id number] Visit Date: 06/17/2019              Requested by: Brunetta Jeans, PA-C [id number] [place],  [place] [id number] PCP: Brunetta Jeans, PA-C   Assessment & Plan: Visit Diagnoses:  1. Right knee pain, unspecified chronicity     Plan: Given the significance of her right knee pain I did recommend a steroid injection in the right knee.  Apparently she has had injections in the past and agrees with this treatment plan.  She did tolerate the injection well.  I would like to keep her out of work the next few days and will allow her to work this Thursday.  I will send in diclofenac as an anti-inflammatory and some tramadol for pain.  I will also give her a temporary handicap placard so she can park closer to where her work.  I would like to see her back in 2 weeks to see how she is doing overall.  All question concerns were answered and addressed and she agreed with the treatment plan.  Follow-Up Instructions: Return in about 2 weeks (around 07/01/2019).   Orders:  Orders Placed This Encounter  Procedures  . Large Joint Inj  . XR Knee 1-2 Views Right   Meds ordered this encounter  Medications  . diclofenac (VOLTAREN) 75 MG EC tablet    Sig: Take 1 tablet (75 mg total) by mouth 2 (two) times daily as needed.    Dispense:  60 tablet    Refill:  1  . traMADol (ULTRAM) 50 MG tablet    Sig: Take 1-2 tablets (50-100 mg total) by mouth every 6 (six) hours as needed.    Dispense:  30 tablet    Refill:  0      Procedures: Large Joint Inj: R knee on 06/17/2019 3:46 PM Indications: diagnostic evaluation and pain Details: 22 G 1.5 in needle, superolateral approach  Arthrogram: No  Medications: 3 mL lidocaine 1 %; 40 mg methylPREDNISolone acetate 40 MG/ML Outcome: tolerated well, no immediate complications Procedure, treatment alternatives, risks and benefits explained, specific  risks discussed. Consent was given by the patient. Immediately prior to procedure a time out was called to verify the correct patient, procedure, equipment, support staff and site/side marked as required. Patient was prepped and draped in the usual sterile fashion.       Clinical Data: No additional findings.   Subjective: Chief Complaint  Patient presents with  . Right Knee - Pain  The patient comes in as a new patient today.  She is been dealing with right knee pain for about 3 weeks now.  She changed her job recently so she is having to go up and down a lot of stairs.  She has never injured her right knee before but now reports swelling in her right knee.  She states that her "leg is heavy".  It also hurts up into her groin.  She is not a diabetic either.  She has a walk a long distance at work and again climb up and down stairs quite regularly  HPI  Review of Systems She currently denies any headache, chest pain, shortness of breath, fever, chills, nausea, vomiting  Objective: Vital Signs: There were no vitals taken for this visit.  Physical Exam  She is alert and orient x3 and in no acute distress Ortho Exam On examination her right knee and right hip are both significantly tender with any attempts of range of motion.  There is no blocks to motion I cannot elicit any evidence of an effusion or acute injury when examining both of these areas today.  The knee is not warm. Specialty Comments:  No specialty comments available.  Imaging: XR Knee 1-2 Views Right  Result Date: 06/17/2019 2 views of the right knee show no acute findings.  There is only minimal    PMFS History: Patient Active Problem List   Diagnosis Date Noted  . Hyperlipidemia 11/06/2017  . Need for immunization against influenza 11/06/2017  . Breast cancer screening 04/28/2017  . Carotid atherosclerosis, bilateral 04/28/2017  . Insomnia 07/28/2015  . Screening for ischemic heart disease 01/02/2014  .  Screening for osteoporosis 01/02/2014  . Primary osteoarthritis of both knees 09/26/2013  . ESOPHAGEAL STRICTURE 10/21/2008  . GERD 10/21/2008  . PERSONAL HX COLONIC POLYPS 10/21/2008  . VULVAR ATROPHY 12/28/2006  . TOBACCO ABUSE 03/07/2006  . Essential hypertension 03/07/2006  . OVARIAN CYST 03/07/2006   Past Medical History:  Diagnosis Date  . Adenomatous colon polyp   . Anemia   . Diverticulosis   . Esophageal stricture   . Fluid in knee    Bilateral  . GERD (gastroesophageal reflux disease)   . Hemorrhagic ovarian cyst 01/2006   Right  . Hemorrhoids   . Hiatal hernia   . HTN (hypertension)   . Hyperplastic colon polyp   . Hypokalemia 06/2005   2nd to diuretic   . Osteoarthritis, knee    Bilateral  . Pneumonia   . Pyelonephritis 0000000   Uncomplicated  . Recurrent boils    Underarms  . Tobacco abuse   . UTI (lower urinary tract infection)   . 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 Hypertension Father        Deceased  . Liver disease Father        Cirrhosis  . Kidney disease Maternal Aunt   . Healthy Mother        Living  . Thyroid disease Mother   . Diabetes Maternal Aunt        x2  . Lung cancer Sister     Past Surgical History:  Procedure Laterality Date  . ABDOMINAL HYSTERECTOMY  1998   Dr Jodi Mourning for fibroids (supracervical)  . BREAST BIOPSY    . BREAST CYST EXCISION     Right   Social History   Occupational History  . 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 Smoking status: Current Every Day Smoker    Packs/day: 0.50    Years: 30.00    Pack years: 15.00    Types: Cigarettes  . Smokeless tobacco: Never Used  Substance and Sexual Activity  . Alcohol use: Yes    Alcohol/week: 12.0 standard drinks    Types: 12 Cans of beer per week    Comment: 1 twelve pack a weekend  . Drug use: No  . Sexual activity: Yes    Partners: Male     Birth control/protection: Surgical    Comment: hysterectomy

## 2019-06-19 ENCOUNTER — Telehealth: Payer: Self-pay | Admitting: Orthopaedic Surgery

## 2019-06-19 NOTE — Telephone Encounter (Signed)
She should just take the Celebrex and not the diclofenac.

## 2019-06-19 NOTE — Telephone Encounter (Signed)
Pharmacy aware

## 2019-06-19 NOTE — Telephone Encounter (Signed)
Darlene Gonzalez from [facility] called wanting to know if Dr. Ninfa Linden is aware of the patient taking Celebrex.  She wanted to know if Dr. Ninfa Linden wanted the patient to discontinue that medication.  CB#[phone].  Thank you.

## 2019-06-19 NOTE — Telephone Encounter (Signed)
I am assuming they are asking this because we sent in some Diclofenac for her

## 2019-07-01 ENCOUNTER — Ambulatory Visit: Payer: Managed Care, Other (non HMO) | Admitting: Orthopaedic Surgery

## 2019-07-01 ENCOUNTER — Other Ambulatory Visit: Payer: Self-pay

## 2019-07-01 ENCOUNTER — Encounter: Payer: Self-pay | Admitting: Orthopaedic Surgery

## 2019-07-01 DIAGNOSIS — G8929 Other chronic pain: Secondary | ICD-10-CM

## 2019-07-01 DIAGNOSIS — M25561 Pain in right knee: Secondary | ICD-10-CM

## 2019-07-01 NOTE — Progress Notes (Signed)
The patient is continuing to deal with problems with weightbearing with her right knee as well as spasming around the knee.  I did see her 2 weeks ago and x-rays were negative for any type of acute injury for the right knee.  She also had well-maintained joint spaces.  She is 59 years old.  She does work on her feet all day long.  She still reports right knee swelling with locking and catching.  She does have some spasms in her thigh as well.  On examination of her right lower extremity today, her right hip exam is normal.  The right knee is still very painful when I attempt to put her through any type of instability testing or range of motion of the right knee.  There is some mild swelling.  At this point will obtain an MRI to rule out a meniscal tear given her continued symptoms and mechanical locking catching.  We will try hinged knee brace as well.  We will likely need to keep her off of her knee and I work still while we wait on the MRI findings.  We will see her back in 2 weeks hopefully go over an MRI of her right knee.  All questions and concerns were answered and addressed.

## 2019-07-02 ENCOUNTER — Other Ambulatory Visit: Payer: Self-pay

## 2019-07-02 DIAGNOSIS — G8929 Other chronic pain: Secondary | ICD-10-CM

## 2019-07-10 ENCOUNTER — Telehealth: Payer: Self-pay | Admitting: Physician Assistant

## 2019-07-10 DIAGNOSIS — E785 Hyperlipidemia, unspecified: Secondary | ICD-10-CM

## 2019-07-10 NOTE — Telephone Encounter (Signed)
LMOVM for patient to call back regarding medication. Unsure of medication

## 2019-07-10 NOTE — Telephone Encounter (Signed)
Pt called in asking for a call back to talk to a nurse about a medication. She wouldn't go into detail with me and she would like a phone call back today. Pt can be reached at the home #

## 2019-07-10 NOTE — Telephone Encounter (Signed)
Spoke with patient she was prescribed Augmentin for her sinuses. She is now having yeast symptoms. Patient is requesting a Diflucan to help.  Patient also calling about sleep. Patient states she has tried OTC medications-Melatonin and sleep aid with no relief. She tried the Belsomra but it didn't work. Has tried [place] but she kept waking up during the night. She wanted to restart the Ambien.  Last night she didn't sleep any at all.

## 2019-07-11 MED ORDER — ATORVASTATIN CALCIUM 20 MG PO TABS: 20.0000 mg | ORAL_TABLET | Freq: Every day | ORAL | 1 refills | Status: DC

## 2019-07-11 MED ORDER — HYDROXYZINE PAMOATE 25 MG PO CAPS
25.0000 mg | ORAL_CAPSULE | Freq: Every day | ORAL | 1 refills | Status: DC
Start: 2019-07-11 — End: 2019-07-29

## 2019-07-11 MED ORDER — FLUCONAZOLE 150 MG PO TABS: 150.0000 mg | ORAL_TABLET | Freq: Once | ORAL | 0 refills | Status: AC

## 2019-07-11 NOTE — Telephone Encounter (Signed)
Ok to send in Diflucan 150 mg once daily Quant 1 with 0 refills. Hold any statin medications the day she takes.   In regards to sleep, giving her work schedule would recommend avoiding Ambien if possible. I know she did not do well with the Belsomra before and OTC medications have been subtherapeutic. I would recommend we do a trial of Vistaril to help with sleep as this should help but not be so long acting as to affect her being up and active for each job. If she is agreeable then would start hydroxyzine 25 mg QHS, quant 30 with 1 Rf. Follow-up 2-3 weeks via video to reassess things. If she declines and wants to discuss other options would need appointment to discuss things in further detail.

## 2019-07-11 NOTE — Telephone Encounter (Signed)
Spoke with patient about her sleep and starting Vistaril 25 mg qhs. She is agreeable with starting Vistaril at bedtime. Rx sent to the pharmacy.  She states she only takes the Ambien on the days she have to work both jobs. The Ambien helps her to get to sleep faster.  Diflucan sent to the pharmacy. Advised not to take statin with Diflucan. She is agreeable.  3 week follow up appointment scheduled for sleep

## 2019-07-15 ENCOUNTER — Ambulatory Visit: Payer: Managed Care, Other (non HMO) | Admitting: Orthopaedic Surgery

## 2019-07-29 ENCOUNTER — Ambulatory Visit: Payer: Managed Care, Other (non HMO) | Admitting: Physician Assistant

## 2019-07-29 ENCOUNTER — Encounter: Payer: Self-pay | Admitting: Physician Assistant

## 2019-07-29 ENCOUNTER — Other Ambulatory Visit: Payer: Self-pay

## 2019-07-29 VITALS — BP 124/78 | HR 84 | Temp 97.1°F | Ht 68.75 in | Wt 201.0 lb

## 2019-07-29 DIAGNOSIS — G4700 Insomnia, unspecified: Secondary | ICD-10-CM

## 2019-07-29 MED ORDER — ZOLPIDEM TARTRATE 10 MG PO TABS: 10.0000 mg | ORAL_TABLET | Freq: Every evening | ORAL | 1 refills | Status: DC | PRN

## 2019-07-29 NOTE — Progress Notes (Signed)
Patient presents to clinic today for follow-up of insomnia. At last visit patient was placed on a regimen of Hydroxyzine 25 mg QHS. Endorses taking as directed, tolerating well but without any improvement in symptoms. Even increased to 2 tablets (50 mg) nightly for a few nights without improvement. She would like to discuss restarting Ambien since she has tried several other medications without success.   Past Medical History:  Diagnosis Date  . Adenomatous colon polyp   . Anemia   . Diverticulosis   . Esophageal stricture   . Fluid in knee    Bilateral  . GERD (gastroesophageal reflux disease)   . Hemorrhagic ovarian cyst 01/2006   Right  . Hemorrhoids   . Hiatal hernia   . HTN (hypertension)   . Hyperplastic colon polyp   . Hypokalemia 06/2005   2nd to diuretic   . Osteoarthritis, knee    Bilateral  . Pneumonia   . Pyelonephritis 09/5275   Uncomplicated  . Recurrent boils    Underarms  . Tobacco abuse   . UTI (lower urinary tract infection)   . 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 amoxicillin-clavulanate (AUGMENTIN) 875-125 MG tablet Take 1 tablet by mouth 2 (two) times daily. 14 tablet 0  . atorvastatin (LIPITOR) 20 MG tablet Take 1 tablet (20 mg total) by mouth daily. 90 tablet 1  . celecoxib (CELEBREX) 100 MG capsule Take 1 capsule (100 mg total) by mouth 2 (two) times daily. 60 capsule 0  . clindamycin (CLEOCIN) 2 % vaginal cream Insert one applicator full in vagina every night for 7 nights. 40 g 0  . diclofenac (VOLTAREN) 75 MG EC tablet Take 1 tablet (75 mg total) by mouth 2 (two) times daily as needed. 60 tablet 1  . hydrOXYzine (VISTARIL) 25 MG capsule Take 1 capsule (25 mg total) by mouth at bedtime. 30 capsule 1  . lisinopril (ZESTRIL) 20 MG tablet Take 1 tablet by mouth once daily 90 tablet 1  . pantoprazole (PROTONIX) 40 MG tablet Take 1 tablet by mouth once daily 90 tablet 2  .  traMADol (ULTRAM) 50 MG tablet Take 1-2 tablets (50-100 mg total) by mouth every 6 (six) hours as needed. 30 tablet 0  . triamcinolone (NASACORT) 55 MCG/ACT AERO nasal inhaler Place 2 sprays into the nose daily. 1 Inhaler 12  . triamterene-hydrochlorothiazide (MAXZIDE-25) 37.5-25 MG tablet Take 1 tablet by mouth once daily 90 tablet 1   No current facility-administered medications on file prior to visit.    No Known Allergies  Family History  Problem Relation Age of Onset  . Hypertension Father        Deceased  . Liver disease Father        Cirrhosis  . Kidney disease Maternal Aunt   . Healthy Mother        Living  . Thyroid disease Mother   . Diabetes Maternal Aunt        x2  . Lung cancer Sister     Social History   Socioeconomic History  . Marital status: Single    Spouse name: Not on file  . Number of children: 0  . Years of education: 12th grade  . Highest education level: Not on file  Occupational History  . 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 Occupation: Public house manager: [employer]  Tobacco Use  . Smoking status: Current  Every Day Smoker    Packs/day: 0.50    Years: 30.00    Pack years: 15.00    Types: Cigarettes  . Smokeless tobacco: Never Used  Substance and Sexual Activity  . Alcohol use: Yes    Alcohol/week: 12.0 standard drinks    Types: 12 Cans of beer per week    Comment: 1 twelve pack a weekend  . Drug use: No  . Sexual activity: Yes    Partners: Male    Birth control/protection: Surgical    Comment: hysterectomy  Other Topics Concern  . Not on file  Social History Narrative   Lives by herself, has stable partner   Social Determinants of Radio broadcast assistant Strain:   . Difficulty of Paying Living Expenses:   Food Insecurity:   . Worried About Charity fundraiser in the Last Year:   . Arboriculturist in the Last Year:   Transportation Needs:   . Film/video editor (Medical):   Marland Kitchen Lack of Transportation  (Non-Medical):   Physical Activity:   . Days of Exercise per Week:   . Minutes of Exercise per Session:   Stress:   . Feeling of Stress :   Social Connections:   . Frequency of Communication with Friends and Family:   . Frequency of Social Gatherings with Friends and Family:   . Attends Religious Services:   . Active Member of Clubs or Organizations:   . Attends Archivist Meetings:   Marland Kitchen Marital Status:     Review of Systems - See HPI.  All other ROS are negative.  There were no vitals taken for this visit.  Physical Exam Vitals reviewed.  Constitutional:      Appearance: Normal appearance.  HENT:     Head: Normocephalic and atraumatic.  Eyes:     Conjunctiva/sclera: Conjunctivae normal.  Musculoskeletal:     Cervical back: Neck supple.  Neurological:     Mental Status: She is alert.  Psychiatric:        Mood and Affect: Mood normal.     Assessment/Plan: 1. Insomnia, unspecified type Sleep hygiene practices reviewed. Stop hydroxyzine. Will agree to restart Ambien as she has tried multiple other medications without good success. Ambien has worked the best for her previously. Is to only take on nights she has to work the next day. Drug holidays on weekends.  - zolpidem (AMBIEN) 10 MG tablet; Take 1 tablet (10 mg total) by mouth at bedtime as needed for sleep.  Dispense: 15 tablet; Refill: 1  This visit occurred during the SARS-CoV-2 public health emergency.  Safety protocols were in place, including screening questions prior to the visit, additional usage of staff PPE, and extensive cleaning of exam room while observing appropriate contact time as indicated for disinfecting solutions.    Leeanne Rio, PA-C

## 2019-07-29 NOTE — Patient Instructions (Signed)
Stop the Hydroxyzine. We will restart the Ambien on work nights to allow more restful sleep since the other medications tried have not been beneficial.  Try to follow the sleep hygiene practices listed below as well.  Let me know via MyChart or phone how things are going.   Take care!  Sleep Hygiene  Do: (1) Go to bed at the same time each day. (2) Get up from bed at the same time each day. (3) Get regular exercise each day, preferably in the morning.  There is goof evidence that regular exercise improves restful sleep.  This includes stretching and aerobic exercise. (4) Get regular exposure to outdoor or bright lights, especially in the late afternoon. (5) Keep the temperature in your bedroom comfortable. (6) Keep the bedroom quiet when sleeping. (7) Keep the bedroom dark enough to facilitate sleep. (8) Use your bed only for sleep and sex. (9) Take medications as directed.  It is helpful to take prescribed sleeping pills 1 hour before bedtime, so they are causing drowsiness when you lie down, or 10 hours before getting up, to avoid daytime drowsiness. (10) Use a relaxation exercise just before going to sleep -- imagery, massage, warm bath. (11) Keep your feet and hands warm.  Wear warm socks and/or mittens or gloves to bed.  Don't: (1) Exercise just before going to bed. (2) Engage in stimulating activity just before bed, such as playing a competitive game, watching an exciting program on television, or having an important discussion with a loved one. (3) Have caffeine in the evening (coffee, teas, chocolate, sodas, etc.) (4) Read or watch television in bed. (5) Use alcohol to help you sleep. (6) Go to bed too hungry or too full. (7) Take another person's sleeping pills. (8) Take over-the-counter sleeping pills, without your doctor's knowledge.  Tolerance can develop rapidly with these medications.  Diphenhydramine can have serious side effects for elderly patients. (9) Take daytime  naps. (10) Command yourself to go to sleep.  This only makes your mind and body more alert.  If you lie awake for more than 20-30 minutes, get up, go to a different room, participate in a quiet activity (Ex - non-excitable reading or television), and then return to bed when you feel sleepy.  Do this as many times during the night as needed.  This may cause you to have a night or two of poor sleep but it will train your brain to know when it is time for sleep.

## 2019-08-12 ENCOUNTER — Other Ambulatory Visit: Payer: Self-pay

## 2019-08-12 ENCOUNTER — Ambulatory Visit
Admission: RE | Admit: 2019-08-12 | Discharge: 2019-08-12 | Disposition: A | Discharge status: Home / Self Care | Payer: Managed Care, Other (non HMO) | Source: Ambulatory Visit | Attending: Orthopaedic Surgery | Admitting: Orthopaedic Surgery

## 2019-08-12 DIAGNOSIS — G8929 Other chronic pain: Secondary | ICD-10-CM
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
## 2019-08-19 ENCOUNTER — Ambulatory Visit: Payer: Managed Care, Other (non HMO) | Admitting: Neurology

## 2019-08-19 ENCOUNTER — Ambulatory Visit: Payer: Managed Care, Other (non HMO) | Admitting: Orthopaedic Surgery

## 2019-08-19 ENCOUNTER — Encounter: Payer: Self-pay | Admitting: Orthopaedic Surgery

## 2019-08-19 ENCOUNTER — Encounter: Payer: Self-pay | Admitting: Neurology

## 2019-08-19 ENCOUNTER — Other Ambulatory Visit: Payer: Self-pay

## 2019-08-19 VITALS — BP 143/85 | HR 83 | Ht 70.0 in | Wt 205.7 lb

## 2019-08-19 DIAGNOSIS — R200 Anesthesia of skin: Secondary | ICD-10-CM

## 2019-08-19 DIAGNOSIS — M25561 Pain in right knee: Secondary | ICD-10-CM

## 2019-08-19 DIAGNOSIS — G8929 Other chronic pain: Secondary | ICD-10-CM

## 2019-08-19 DIAGNOSIS — S83281D Other tear of lateral meniscus, current injury, right knee, subsequent encounter: Secondary | ICD-10-CM | POA: Diagnosis not present

## 2019-08-19 DIAGNOSIS — R202 Paresthesia of skin: Secondary | ICD-10-CM

## 2019-08-19 NOTE — Patient Instructions (Signed)
I think the numbness in the hand and foot are due to 2 separate causes. 1.  The hand is likely due to a nerve in the arm or from the neck 2.  The toes is likely from a nerve in the back  We will check a nerve study of the arm and leg.  Follow up after testing.  Further recommendations pending results.

## 2019-08-19 NOTE — Progress Notes (Signed)
The patient comes in today to go over an MRI of her right knee.  She has been having left knee pain and swelling without right knee and had failed conservative treatment with rest, activity modification, anti-inflammatories and a steroid injection.  Her plain film still showed well-maintained joint space so a MRI was appropriate at this standpoint.  She still reports significant knee pain more medial and laterally as well as knee swelling.  On exam her knee is slightly swollen.  It is painful medially and laterally.  It is ligamentously stable otherwise.  The MRI of her knee is reviewed with her.  It does show a large and complex lateral meniscal tear.  There is significant inflammation and impingement from Hoffa's fat pad.  The cartilage itself is maintained in the which is good to see but certainly having a significant meniscal tear and inflammation from impingement of Hoffa's fat pad is likely causing the symptoms.  I did show her knee model and explained to her our recommendation for knee arthroscopy.  This can hopefully be significantly beneficial to her given the pain she is having in her knee.  I gave her our surgery scheduler's card.  I talked about her interoperative and postoperative course and explained in detail what the surgery involves.  She does not need to be working now in terms of any type of job going up or down stairs.  She is interested in surgery but needs to make some arrangements and see what her insurance covers and see who can help her out for least a day at home and get her to surgery as well.  Again, she has her surgery scheduler's card and will let us know.

## 2019-09-06 ENCOUNTER — Other Ambulatory Visit: Payer: Self-pay | Admitting: Physician Assistant

## 2019-09-06 DIAGNOSIS — I10 Essential (primary) hypertension: Secondary | ICD-10-CM

## 2019-09-27 ENCOUNTER — Telehealth: Payer: Self-pay | Admitting: Radiology

## 2019-09-27 NOTE — Telephone Encounter (Signed)
Arrie Aran, [facility]. Sent in request to confirm if patient has any additional restrictions at work other than going up and down steps.  Also would the patient benefit from an intermittent leave of absence.  Please advise.

## 2019-09-30 NOTE — Telephone Encounter (Signed)
There is really no other work restrictions for her.  She would likely benefit from intermittent time off such as 1 or 2 days in a row up to twice a month to help decrease her knee pain for those times of her knees do start hurting worse.  This would be as needed.

## 2019-10-01 ENCOUNTER — Other Ambulatory Visit: Payer: Self-pay | Admitting: Physician Assistant

## 2019-10-01 DIAGNOSIS — G4700 Insomnia, unspecified: Secondary | ICD-10-CM

## 2019-10-01 NOTE — Telephone Encounter (Signed)
Ambien last rx 07/29/19 #15 1 RF LOV: 07/29/19 Insomnia

## 2019-10-01 NOTE — Telephone Encounter (Signed)
Completed and faxed back to [facility]

## 2019-10-05 ENCOUNTER — Other Ambulatory Visit: Payer: Self-pay | Admitting: Orthopaedic Surgery

## 2019-10-07 NOTE — Telephone Encounter (Signed)
Please advise 

## 2019-10-16 ENCOUNTER — Encounter: Payer: Managed Care, Other (non HMO) | Admitting: Neurology

## 2019-10-21 ENCOUNTER — Ambulatory Visit: Payer: Managed Care, Other (non HMO) | Admitting: Obstetrics and Gynecology

## 2019-10-21 ENCOUNTER — Telehealth: Payer: Self-pay | Admitting: Obstetrics and Gynecology

## 2019-10-21 NOTE — Progress Notes (Deleted)
GYNECOLOGY  VISIT   HPI: 59 y.o.   Single Black or African American Not Hispanic or Latino  female   [phone] with No LMP recorded. Patient has had a hysterectomy.   here for vaginal itching and irritation.   GYNECOLOGIC HISTORY: No LMP recorded. Patient has had a hysterectomy. Contraception:PMP Menopausal hormone therapy: none        OB History    Gravida  3   Para      Term      Preterm      AB  3   Living  0     SAB  3   TAB      Ectopic      Multiple      Live Births                 Patient Active Problem List   Diagnosis Date Noted  . Hyperlipidemia 11/06/2017  . Need for immunization against influenza 11/06/2017  . Breast cancer screening 04/28/2017  . Carotid atherosclerosis, bilateral 04/28/2017  . Insomnia 07/28/2015  . Screening for ischemic heart disease 01/02/2014  . Screening for osteoporosis 01/02/2014  . Primary osteoarthritis of both knees 09/26/2013  . ESOPHAGEAL STRICTURE 10/21/2008  . GERD 10/21/2008  . PERSONAL HX COLONIC POLYPS 10/21/2008  . VULVAR ATROPHY 12/28/2006  . TOBACCO ABUSE 03/07/2006  . Essential hypertension 03/07/2006  . OVARIAN CYST 03/07/2006    Past Medical History:  Diagnosis Date  . Adenomatous colon polyp   . Anemia   . Diverticulosis   . Esophageal stricture   . Fluid in knee    Bilateral  . GERD (gastroesophageal reflux disease)   . Hemorrhagic ovarian cyst 01/2006   Right  . Hemorrhoids   . Hiatal hernia   . HTN (hypertension)   . Hyperplastic colon polyp   . Hypokalemia 06/2005   2nd to diuretic   . Osteoarthritis, knee    Bilateral  . Pneumonia   . Pyelonephritis 0/9381   Uncomplicated  . Recurrent boils    Underarms  . Tobacco abuse   . UTI (lower urinary tract infection)   . 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 ABDOMINAL HYSTERECTOMY  1998   Dr Jodi Mourning for fibroids (supracervical)  . BREAST BIOPSY    . BREAST CYST EXCISION     Right     Current Outpatient Medications  Medication Sig Dispense Refill  . atorvastatin (LIPITOR) 20 MG tablet Take 1 tablet (20 mg total) by mouth daily. 90 tablet 1  . celecoxib (CELEBREX) 100 MG capsule Take 1 capsule (100 mg total) by mouth 2 (two) times daily. (Patient not taking: Reported on 08/19/2019) 60 capsule 0  . clindamycin (CLEOCIN) 2 % vaginal cream Insert one applicator full in vagina every night for 7 nights. (Patient not taking: Reported on 08/19/2019) 40 g 0  . diclofenac (VOLTAREN) 75 MG EC tablet Take 1 tablet (75 mg total) by mouth 2 (two) times daily as needed. (Patient not taking: Reported on 08/19/2019) 60 tablet 1  . lisinopril (ZESTRIL) 20 MG tablet Take 1 tablet by mouth once daily 90 tablet 1  . pantoprazole (PROTONIX) 40 MG tablet Take 1 tablet by mouth once daily 90 tablet 2  . traMADol (ULTRAM) 50 MG tablet TAKE 1 TO 2 TABLETS BY MOUTH EVERY 6 HOURS AS NEEDED 30 tablet 0  . triamcinolone (NASACORT) 55 MCG/ACT AERO nasal inhaler Place 2 sprays into the nose daily. 1 Inhaler  12  . triamterene-hydrochlorothiazide (MAXZIDE-25) 37.5-25 MG tablet Take 1 tablet by mouth once daily 90 tablet 1  . zolpidem (AMBIEN) 10 MG tablet TAKE 1 TABLET BY MOUTH AT BEDTIME AS NEEDED FOR  SLEEP 15 tablet 0   No current facility-administered medications for this visit.     ALLERGIES: Patient has no known allergies.  Family History  Problem Relation Age of Onset  . Hypertension Father        Deceased  . Liver disease Father        Cirrhosis  . Kidney disease Maternal Aunt   . Healthy Mother        Living  . Thyroid disease Mother   . Diabetes Maternal Aunt        x2  . Lung cancer Sister     Social History   Socioeconomic History  . Marital status: Single    Spouse name: Not on file  . Number of children: 0  . Years of education: 12th grade  . Highest education level: Not on file  Occupational History  . 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[employer]  . Occupation:  Public house manager: [employer]  Tobacco Use  . Smoking status: Current Every Day Smoker    Packs/day: 0.50    Years: 30.00    Pack years: 15.00    Types: Cigarettes  . Smokeless tobacco: Never Used  Vaping Use  . Vaping Use: Never used  Substance and Sexual Activity  . Alcohol use: Yes    Alcohol/week: 12.0 standard drinks    Types: 12 Cans of beer per week    Comment: 1 twelve pack a weekend  . Drug use: No  . Sexual activity: Yes    Partners: Male    Birth control/protection: Surgical    Comment: hysterectomy  Other Topics Concern  . Not on file  Social History Narrative   Lives by herself, has stable partner   Right handed   Social Determinants of Health   Financial Resource Strain:   . Difficulty of Paying Living Expenses: Not on file  Food Insecurity:   . Worried About Charity fundraiser in the Last Year: Not on file  . Ran Out of Food in the Last Year: Not on file  Transportation Needs:   . Lack of Transportation (Medical): Not on file  . Lack of Transportation (Non-Medical): Not on file  Physical Activity:   . Days of Exercise per Week: Not on file  . Minutes of Exercise per Session: Not on file  Stress:   . Feeling of Stress : Not on file  Social Connections:   . Frequency of Communication with Friends and Family: Not on file  . Frequency of Social Gatherings with Friends and Family: Not on file  . Attends Religious Services: Not on file  . Active Member of Clubs or Organizations: Not on file  . Attends Archivist Meetings: Not on file  . Marital Status: Not on file  Intimate Partner Violence:   . Fear of Current or Ex-Partner: Not on file  . Emotionally Abused: Not on file  . Physically Abused: Not on file  . Sexually Abused: Not on file    ROS  PHYSICAL EXAMINATION:    There were no vitals taken for this visit.    General appearance: alert, cooperative and appears stated age Neck: no adenopathy, supple, symmetrical, trachea midline and  thyroid {CHL AMB PHY EX THYROID NORM DEFAULT:[phone]::"normal to inspection and  palpation"} Breasts: {Exam; breast:13139::"normal appearance, no masses or tenderness"} Abdomen: soft, non-tender; non distended, no masses,  no organomegaly  Pelvic: External genitalia:  no lesions              Urethra:  normal appearing urethra with no masses, tenderness or lesions              Bartholins and Skenes: normal                 Vagina: normal appearing vagina with normal color and discharge, no lesions              Cervix: {CHL AMB PHY EX CERVIX NORM DEFAULT:[phone]::"no lesions"}              Bimanual Exam:  Uterus:  {CHL AMB PHY EX UTERUS NORM DEFAULT:[phone]::"normal size, contour, position, consistency, mobility, non-tender"}              Adnexa: {CHL AMB PHY EX ADNEXA NO MASS DEFAULT:[phone]::"no mass, fullness, tenderness"}              Rectovaginal: {yes no:[id number]}.  Confirms.              Anus:  normal sphincter tone, no lesions  Chaperone was present for exam.  ASSESSMENT     PLAN    An After Visit Summary was printed and given to the patient.  *** minutes face to face time of which over 50% was spent in counseling.

## 2019-10-21 NOTE — Telephone Encounter (Signed)
Patient cancelled appointment today because she was exposed to covid at work. Will call back to reschedule.

## 2019-11-15 ENCOUNTER — Other Ambulatory Visit: Payer: Self-pay | Admitting: Obstetrics and Gynecology

## 2019-11-15 ENCOUNTER — Encounter: Payer: Self-pay | Admitting: Obstetrics and Gynecology

## 2019-11-15 ENCOUNTER — Other Ambulatory Visit: Payer: Self-pay

## 2019-11-15 ENCOUNTER — Ambulatory Visit: Payer: Managed Care, Other (non HMO) | Admitting: Obstetrics and Gynecology

## 2019-11-15 VITALS — BP 110/60 | HR 80 | Resp 14 | Ht 70.0 in | Wt 204.0 lb

## 2019-11-15 DIAGNOSIS — N762 Acute vulvitis: Secondary | ICD-10-CM | POA: Diagnosis not present

## 2019-11-15 MED ORDER — NYSTATIN-TRIAMCINOLONE 100000-0.1 UNIT/GM-% EX OINT
1.0000 | TOPICAL_OINTMENT | Freq: Two times a day (BID) | CUTANEOUS | 0 refills | Status: DC
Start: 2019-11-15 — End: 2020-03-20

## 2019-11-15 MED ORDER — HYDROXYZINE HCL 10 MG PO TABS: 10.0000 mg | ORAL_TABLET | Freq: Three times a day (TID) | ORAL | 0 refills | Status: DC | PRN

## 2019-11-15 NOTE — Progress Notes (Signed)
GYNECOLOGY  VISIT   HPI: 59 y.o.   Single Black or African American Not Hispanic or Latino  female   [phone] with No LMP recorded. Patient has had a hysterectomy.   here for vaginal itching. Patient of having itching and swelling on the outside of the vagina  She was treated for BV and yeast on 02/18/19 and for persistent BV on 03/04/19.   She doesn't feel she every completely cleared up the infection.  She c/o a one month h/o itching lateral to the vulva. No vaginal d/c. Some vulvar itching.  She has a partner, intermittently sexually active.   GYNECOLOGIC HISTORY: No LMP recorded. Patient has had a hysterectomy. Contraception:Hysterectomy Menopausal hormone therapy: none        OB History    Gravida  3   Para      Term      Preterm      AB  3   Living  0     SAB  3   TAB      Ectopic      Multiple      Live Births                 Patient Active Problem List   Diagnosis Date Noted  . Hyperlipidemia 11/06/2017  . Need for immunization against influenza 11/06/2017  . Breast cancer screening 04/28/2017  . Carotid atherosclerosis, bilateral 04/28/2017  . Insomnia 07/28/2015  . Screening for ischemic heart disease 01/02/2014  . Screening for osteoporosis 01/02/2014  . Primary osteoarthritis of both knees 09/26/2013  . ESOPHAGEAL STRICTURE 10/21/2008  . GERD 10/21/2008  . PERSONAL HX COLONIC POLYPS 10/21/2008  . VULVAR ATROPHY 12/28/2006  . TOBACCO ABUSE 03/07/2006  . Essential hypertension 03/07/2006  . OVARIAN CYST 03/07/2006    Past Medical History:  Diagnosis Date  . Adenomatous colon polyp   . Anemia   . Diverticulosis   . Esophageal stricture   . Fluid in knee    Bilateral  . GERD (gastroesophageal reflux disease)   . Hemorrhagic ovarian cyst 01/2006   Right  . Hemorrhoids   . Hiatal hernia   . HTN (hypertension)   . Hyperplastic colon polyp   . Hypokalemia 06/2005   2nd to diuretic   . Osteoarthritis, knee    Bilateral  . Pneumonia    . Pyelonephritis 10/9240   Uncomplicated  . Recurrent boils    Underarms  . Tobacco abuse   . UTI (lower urinary tract infection)   . 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 ABDOMINAL HYSTERECTOMY  1998   Dr Jodi Mourning for fibroids (supracervical)  . BREAST BIOPSY    . BREAST CYST EXCISION     Right    Current Outpatient Medications  Medication Sig Dispense Refill  . atorvastatin (LIPITOR) 20 MG tablet Take 1 tablet (20 mg total) by mouth daily. 90 tablet 1  . lisinopril (ZESTRIL) 20 MG tablet Take 1 tablet by mouth once daily 90 tablet 1  . pantoprazole (PROTONIX) 40 MG tablet Take 1 tablet by mouth once daily 90 tablet 2  . triamterene-hydrochlorothiazide (MAXZIDE-25) 37.5-25 MG tablet Take 1 tablet by mouth once daily 90 tablet 1  . zolpidem (AMBIEN) 10 MG tablet TAKE 1 TABLET BY MOUTH AT BEDTIME AS NEEDED FOR  SLEEP 15 tablet 0   No current facility-administered medications for this visit.     ALLERGIES: Patient has no known allergies.  Family History  Problem  Relation Age of Onset  . Hypertension Father        Deceased  . Liver disease Father        Cirrhosis  . Kidney disease Maternal Aunt   . Healthy Mother        Living  . Thyroid disease Mother   . Diabetes Maternal Aunt        x2  . Lung cancer Sister   . Bladder Cancer Sister     Social History   Socioeconomic History  . Marital status: Single    Spouse name: Not on file  . Number of children: 0  . Years of education: 12th grade  . Highest education level: Not on file  Occupational History  . 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[employer]  . Occupation: Public house manager: [employer]  Tobacco Use  . Smoking status: Current Every Day Smoker    Packs/day: 0.50    Years: 30.00    Pack years: 15.00    Types: Cigarettes  . Smokeless tobacco: Never Used  Vaping Use  . Vaping Use: Never used  Substance and Sexual Activity  . Alcohol use: Yes     Alcohol/week: 12.0 standard drinks    Types: 12 Cans of beer per week    Comment: 1 twelve pack a weekend  . Drug use: No  . Sexual activity: Yes    Partners: Male    Birth control/protection: Surgical    Comment: hysterectomy  Other Topics Concern  . Not on file  Social History Narrative   Lives by herself, has stable partner   Right handed   Social Determinants of Health   Financial Resource Strain:   . Difficulty of Paying Living Expenses: Not on file  Food Insecurity:   . Worried About Charity fundraiser in the Last Year: Not on file  . Ran Out of Food in the Last Year: Not on file  Transportation Needs:   . Lack of Transportation (Medical): Not on file  . Lack of Transportation (Non-Medical): Not on file  Physical Activity:   . Days of Exercise per Week: Not on file  . Minutes of Exercise per Session: Not on file  Stress:   . Feeling of Stress : Not on file  Social Connections:   . Frequency of Communication with Friends and Family: Not on file  . Frequency of Social Gatherings with Friends and Family: Not on file  . Attends Religious Services: Not on file  . Active Member of Clubs or Organizations: Not on file  . Attends Archivist Meetings: Not on file  . Marital Status: Not on file  Intimate Partner Violence:   . Fear of Current or Ex-Partner: Not on file  . Emotionally Abused: Not on file  . Physically Abused: Not on file  . Sexually Abused: Not on file    Review of Systems  Constitutional: Negative.   HENT: Negative.   Eyes: Negative.   Respiratory: Negative.   Cardiovascular: Negative.   Gastrointestinal: Negative.   Genitourinary:       Vulvar itching Vulvar swelling  Musculoskeletal: Negative.   Skin: Negative.   Neurological: Negative.   Endo/Heme/Allergies: Negative.   Psychiatric/Behavioral: Negative.     PHYSICAL EXAMINATION:    BP 110/60 (BP Location: Right Arm, Patient Position: Sitting, Cuff Size: Normal)   Pulse 80   Resp  14   Ht 5\' 10"  (1.778 m)   Wt 204 lb (92.5 kg)  BMI 29.27 kg/m     General appearance: alert, cooperative and appears stated age  Pelvic: External genitalia:  The outer labia majora, upper inner labia majora and inguinal folds are erythematous, white, raised, + fissures.              Urethra:  normal appearing urethra with no masses, tenderness or lesions              Bartholins and Skenes: normal                 Vagina: atrophic appearing vagina with some erythema, small amount of thin, white vaginal d/c              Chaperone was present for exam.  ASSESSMENT Severe vulvitis    PLAN Send nuswab vaginitis panel Treat with Mycolog ointment and atarax F/U in 2 weeks.

## 2019-11-17 LAB — NUSWAB VAGINITIS (VG)
Candida albicans, NAA: NEGATIVE
Candida glabrata, NAA: POSITIVE — AB
Trich vag by NAA: NEGATIVE

## 2019-11-18 ENCOUNTER — Telehealth: Payer: Self-pay

## 2019-11-18 MED ORDER — NONFORMULARY OR COMPOUNDED ITEM: 0 refills | Status: DC

## 2019-11-18 NOTE — Telephone Encounter (Signed)
Spoke with pt. Pt given results and recommendations per Dr Talbert Nan. Pt agreeable and verbalized understanding of Rx Boric Acid. Will send Rx via fax to [facility] today. Pt aware of call from pharmacy when ready and to only use vaginally.   Pt states since using Mycolog ointment and Atarax, itching has improved. Advised pt will give Dr Talbert Nan update.  Pt has 2 week f/u scheduled on 10/12 at 4 pm. Pt aware.   Routing to Dr Talbert Nan for update.

## 2019-11-18 NOTE — Telephone Encounter (Signed)
-----   Message from Salvadore Dom, MD sent at 11/18/2019 12:43 PM EDT ----- Please let the patient know that her vaginitis panel returned as +for candida glabrata. Typically this type of yeast is not symptomatic vaginally, but we can treat it with compounded boric acid, 600 mg, 1 tablet VAGINALLY qhs x 2 weeks given her symptoms. Make sure she understands that this is not for oral use.  Please see if she is feeling any better with the medication I started her on last week?

## 2019-11-19 NOTE — Telephone Encounter (Signed)
Rx faxed to [facility] today.  Encounter closed.

## 2019-11-27 ENCOUNTER — Ambulatory Visit: Payer: Managed Care, Other (non HMO) | Admitting: Physician Assistant

## 2019-11-27 ENCOUNTER — Other Ambulatory Visit: Payer: Self-pay

## 2019-11-27 ENCOUNTER — Encounter: Payer: Self-pay | Admitting: Physician Assistant

## 2019-11-27 VITALS — BP 130/78 | HR 71 | Temp 97.5°F | Resp 19

## 2019-11-27 DIAGNOSIS — G4700 Insomnia, unspecified: Secondary | ICD-10-CM | POA: Diagnosis not present

## 2019-11-27 DIAGNOSIS — G8929 Other chronic pain: Secondary | ICD-10-CM

## 2019-11-27 DIAGNOSIS — Z23 Encounter for immunization: Secondary | ICD-10-CM

## 2019-11-27 DIAGNOSIS — R109 Unspecified abdominal pain: Secondary | ICD-10-CM

## 2019-11-27 LAB — POCT URINALYSIS DIPSTICK
Bilirubin, UA: NEGATIVE
Glucose, UA: NEGATIVE
Ketones, UA: NEGATIVE
Leukocytes, UA: NEGATIVE
Nitrite, UA: NEGATIVE
Protein, UA: NEGATIVE
Spec Grav, UA: 1.01 (ref 1.010–1.025)
Urobilinogen, UA: 0.2 E.U./dL
pH, UA: 8 (ref 5.0–8.0)

## 2019-11-27 NOTE — Patient Instructions (Addendum)
Please keep well-hydrated.  Avoid heavy, fatty meals as it is very hard for your digestive system to continue breaking all of this down at a time.   Follow dietary recommendations below. Continue your Protonix and start a daily probiotic.  Giving chronicity of symptoms in addition to lab work I want you to have evaluation with Gastroenterology. I have placed a referral. You will be contacted by the specialist group within 7-10 days. If not, let me know.    Low-FODMAP Eating Plan  FODMAPs (fermentable oligosaccharides, disaccharides, monosaccharides, and polyols) are sugars that are hard for some people to digest. A low-FODMAP eating plan may help some people who have bowel (intestinal) diseases to manage their symptoms. This meal plan can be complicated to follow. Work with a diet and nutrition specialist (dietitian) to make a low-FODMAP eating plan that is right for you. A dietitian can make sure that you get enough nutrition from this diet. What are tips for following this plan? Reading food labels  Check labels for hidden FODMAPs such as: ? High-fructose syrup. ? Honey. ? Agave. ? Natural fruit flavors. ? Onion or garlic powder.  Choose low-FODMAP foods that contain 3-4 grams of fiber per serving.  Check food labels for serving sizes. Eat only one serving at a time to make sure FODMAP levels stay low. Meal planning  Follow a low-FODMAP eating plan for up to 6 weeks, or as told by your health care provider or dietitian.  To follow the eating plan: 1. Eliminate high-FODMAP foods from your diet completely. 2. Gradually reintroduce high-FODMAP foods into your diet one at a time. Most people should wait a few days after introducing one high-FODMAP food before they introduce the next high-FODMAP food. Your dietitian can recommend how quickly you may reintroduce foods. 3. Keep a daily record of what you eat and drink, and make note of any symptoms that you have after eating. 4. Review  your daily record with a dietitian regularly. Your dietitian can help you identify which foods you can eat and which foods you should avoid. General tips  Drink enough fluid each day to keep your urine pale yellow.  Avoid processed foods. These often have added sugar and may be high in FODMAPs.  Avoid most dairy products, whole grains, and sweeteners.  Work with a dietitian to make sure you get enough fiber in your diet. Recommended foods Grains  Gluten-free grains, such as rice, oats, buckwheat, quinoa, corn, polenta, and millet. Gluten-free pasta, bread, or cereal. Rice noodles. Corn tortillas. Vegetables  Eggplant, zucchini, cucumber, peppers, green beans, Brussels sprouts, bean sprouts, lettuce, arugula, kale, Swiss chard, spinach, collard greens, bok choy, summer squash, potato, and tomato. Limited amounts of corn, carrot, and sweet potato. Green parts of scallions. Fruits  Bananas, oranges, lemons, limes, blueberries, raspberries, strawberries, grapes, cantaloupe, honeydew melon, kiwi, papaya, passion fruit, and pineapple. Limited amounts of dried cranberries, banana chips, and shredded coconut. Dairy  Lactose-free milk, yogurt, and kefir. Lactose-free cottage cheese and ice cream. Non-dairy milks, such as almond, coconut, hemp, and rice milk. Yogurts made of non-dairy milks. Limited amounts of goat cheese, brie, mozzarella, parmesan, swiss, and other hard cheeses. Meats and other protein foods  Unseasoned beef, pork, poultry, or fish. Eggs. Berniece Salines. Tofu (firm) and tempeh. Limited amounts of nuts and seeds, such as almonds, walnuts, [place] nuts, pecans, peanuts, pumpkin seeds, chia seeds, and sunflower seeds. Fats and oils  Butter-free spreads. Vegetable oils, such as olive, canola, and sunflower oil. Seasoning and other foods  Artificial sweeteners with names that do not end in "ol" such as aspartame, saccharine, and stevia. Maple syrup, white table sugar, raw sugar, brown sugar,  and molasses. Fresh basil, coriander, parsley, rosemary, and thyme. Beverages  Water and mineral water. Sugar-sweetened soft drinks. Small amounts of orange juice or cranberry juice. Black and green tea. Most dry wines. Coffee. This may not be a complete list of low-FODMAP foods. Talk with your dietitian for more information. Foods to avoid Grains  Wheat, including kamut, durum, and semolina. Barley and bulgur. Couscous. Wheat-based cereals. Wheat noodles, bread, crackers, and pastries. Vegetables  Chicory root, artichoke, asparagus, cabbage, snow peas, sugar snap peas, mushrooms, and cauliflower. Onions, garlic, leeks, and the white part of scallions. Fruits  Fresh, dried, and juiced forms of apple, pear, watermelon, peach, plum, cherries, apricots, blackberries, boysenberries, figs, nectarines, and mango. Avocado. Dairy  Milk, yogurt, ice cream, and soft cheese. Cream and sour cream. Milk-based sauces. Custard. Meats and other protein foods  Fried or fatty meat. Sausage. Cashews and pistachios. Soybeans, baked beans, black beans, chickpeas, kidney beans, fava beans, navy beans, lentils, and split peas. Seasoning and other foods  Any sugar-free gum or candy. Foods that contain artificial sweeteners such as sorbitol, mannitol, isomalt, or xylitol. Foods that contain honey, high-fructose corn syrup, or agave. Bouillon, vegetable stock, beef stock, and chicken stock. Garlic and onion powder. Condiments made with onion, such as hummus, chutney, pickles, relish, salad dressing, and salsa. Tomato paste. Beverages  Chicory-based drinks. Coffee substitutes. Chamomile tea. Fennel tea. Sweet or fortified wines such as port or sherry. Diet soft drinks made with isomalt, mannitol, maltitol, sorbitol, or xylitol. Apple, pear, and mango juice. Juices with high-fructose corn syrup. This may not be a complete list of high-FODMAP foods. Talk with your dietitian to discuss what dietary choices are best for  you.  Summary  A low-FODMAP eating plan is a short-term diet that eliminates FODMAPs from your diet to help ease symptoms of certain bowel diseases.  The eating plan usually lasts up to 6 weeks. After that, high-FODMAP foods are restarted gradually, one at a time, so you can find out which may be causing symptoms.  A low-FODMAP eating plan can be complicated. It is best to work with a dietitian who has experience with this type of plan. This information is not intended to replace advice given to you by your health care provider. Make sure you discuss any questions you have with your health care provider. Document Revised: 01/20/2017 Document Reviewed: 10/04/2016 Elsevier Patient Education  [place].

## 2019-11-27 NOTE — Progress Notes (Signed)
Patient presents to clinic today c/o intermittent abdominal pain, described as bilateral lower abdominal cramping, x6 months.  Notes this is been associated with chronic constipation.  Denies any heartburn or indigestion, nausea or vomiting, or decreased appetite.  Denies melena, hematochezia or tenesmus.  Denies fever, chills or weight loss.  Does note abdominal cramping seems to occur more after intake of fatty foods.  Denies upper abdominal pain.  Has noted with recent changes in her work schedule she has been eating more and has gained several pounds.  Notes eating much more fried, greasy and fatty foods than what is normal for her.  Patient states she wants to get things checked out especially because her sister was recently diagnosed with bladder cancer.  Patient denies any dysuria, urinary urgency or frequency or hematuria.  Is asymptomatic at present.   Past Medical History:  Diagnosis Date  . Adenomatous colon polyp   . Anemia   . Diverticulosis   . Esophageal stricture   . Fluid in knee    Bilateral  . GERD (gastroesophageal reflux disease)   . Hemorrhagic ovarian cyst 01/2006   Right  . Hemorrhoids   . Hiatal hernia   . HTN (hypertension)   . Hyperplastic colon polyp   . Hypokalemia 06/2005   2nd to diuretic   . Osteoarthritis, knee    Bilateral  . Pneumonia   . Pyelonephritis 07/2829   Uncomplicated  . Recurrent boils    Underarms  . Tobacco abuse   . UTI (lower urinary tract infection)   . 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 atorvastatin (LIPITOR) 20 MG tablet Take 1 tablet (20 mg total) by mouth daily. 90 tablet 1  . hydrOXYzine (ATARAX/VISTARIL) 10 MG tablet Take 1 tablet (10 mg total) by mouth 3 (three) times daily as needed. 30 tablet 0  . lisinopril (ZESTRIL) 20 MG tablet Take 1 tablet by mouth once daily 90 tablet 1  . NONFORMULARY OR COMPOUNDED ITEM compounded boric acid, 600 mg,  1 tablet VAGINALLY qhs x 2 weeks. 1 each 0  . nystatin-triamcinolone ointment (MYCOLOG) Apply 1 application topically 2 (two) times daily. Apply BID for up to 7 days. 60 g 0  . pantoprazole (PROTONIX) 40 MG tablet Take 1 tablet by mouth once daily 90 tablet 2  . triamterene-hydrochlorothiazide (MAXZIDE-25) 37.5-25 MG tablet Take 1 tablet by mouth once daily 90 tablet 1  . zolpidem (AMBIEN) 10 MG tablet TAKE 1 TABLET BY MOUTH AT BEDTIME AS NEEDED FOR  SLEEP 15 tablet 0   No current facility-administered medications on file prior to visit.    No Known Allergies  Family History  Problem Relation Age of Onset  . Hypertension Father        Deceased  . Liver disease Father        Cirrhosis  . Kidney disease Maternal Aunt   . Healthy Mother        Living  . Thyroid disease Mother   . Diabetes Maternal Aunt        x2  . Lung cancer Sister   . Bladder Cancer Sister     Social History   Socioeconomic History  . Marital status: Single    Spouse name: Not on file  . Number of children: 0  . Years of education: 12th grade  . Highest education level: Not on file  Occupational History  . Occupation: Clinical research associate  Employer: [employer]  . Occupation: Public house manager: [id number]  Tobacco Use  . Smoking status: Current Every Day Smoker    Packs/day: 0.50    Years: 30.00    Pack years: 15.00    Types: Cigarettes  . Smokeless tobacco: Never Used  Vaping Use  . Vaping Use: Never used  Substance and Sexual Activity  . Alcohol use: Yes    Alcohol/week: 12.0 standard drinks    Types: 12 Cans of beer per week    Comment: 1 twelve pack a weekend  . Drug use: No  . Sexual activity: Yes    Partners: Male    Birth control/protection: Surgical    Comment: hysterectomy  Other Topics Concern  . Not on file  Social History Narrative   Lives by herself, has stable partner   Right handed   Social Determinants of Health   Financial Resource Strain:   . Difficulty of Paying Living  Expenses: Not on file  Food Insecurity:   . Worried About Charity fundraiser in the Last Year: Not on file  . Ran Out of Food in the Last Year: Not on file  Transportation Needs:   . Lack of Transportation (Medical): Not on file  . Lack of Transportation (Non-Medical): Not on file  Physical Activity:   . Days of Exercise per Week: Not on file  . Minutes of Exercise per Session: Not on file  Stress:   . Feeling of Stress : Not on file  Social Connections:   . Frequency of Communication with Friends and Family: Not on file  . Frequency of Social Gatherings with Friends and Family: Not on file  . Attends Religious Services: Not on file  . Active Member of Clubs or Organizations: Not on file  . Attends Archivist Meetings: Not on file  . Marital Status: Not on file   Review of Systems - See HPI.  All other ROS are negative.  There were no vitals taken for this visit.  Physical Exam Vitals reviewed.  Constitutional:      Appearance: She is well-developed.  HENT:     Head: Normocephalic and atraumatic.  Cardiovascular:     Rate and Rhythm: Normal rate and regular rhythm.  Pulmonary:     Effort: Pulmonary effort is normal.     Breath sounds: Normal breath sounds.  Abdominal:     General: Bowel sounds are normal.     Palpations: Abdomen is soft.     Tenderness: There is no abdominal tenderness. There is no right CVA tenderness, left CVA tenderness, guarding or rebound. Negative signs include Murphy's sign.     Hernia: No hernia is present.  Neurological:     General: No focal deficit present.     Mental Status: She is alert.  Psychiatric:        Mood and Affect: Mood normal.     Recent Results (from the past 2160 hour(s))  NuSwab Vaginitis (VG)     Status: Abnormal   Collection Time: 11/15/19  4:15 PM  Result Value Ref Range   Atopobium vaginae Low - 0 Score   BVAB 2 Low - 0 Score   Megasphaera 1 Low - 0 Score    Comment: Calculate total score by adding the 3  individual bacterial vaginosis (BV) marker scores together.  Total score is interpreted as follows: Total score 0-1: Indicates the absence of BV. Total score   2: Indeterminate for BV. Additional clinical  data should be evaluated to establish a                  diagnosis. Total score 3-6: Indicates the presence of BV. This test was developed and its performance characteristics determined by [facility].  It has not been cleared or approved by the Food and Drug Administration.    Candida albicans, NAA Negative Negative   Candida glabrata, NAA Positive (A) Negative    Comment: Published data demonstrate that up to 65% of Candida glabrata identified in cases of vaginal candidiasis have decreased susceptibility to fluconazole.    Trich vag by NAA Negative Negative    Assessment/Plan: 1. Chronic abdominal pain Unclear etiology although suspect this is related to recent dietary change with substantial increase in fatty foods that are harder for her body to breakdown. Discussed low-fat diet, proper fiber intake and good hydration. Recommend she start daily probiotic. Will obtain labs to include CBC, CMP and lipase. UA obtained and unremarkable except for trace positive blood. Will send for micro to confirm or rule out. Giving ongoing symptoms she does need further assessment. We'll refer her to gastroenterology for further evaluation and management. - POCT urinalysis dipstick - CBC with Differential/Platelet - Comprehensive metabolic panel - Lipase - Ambulatory referral to Gastroenterology - Urine Microscopic Only  2. Insomnia, unspecified type In need of refill of her Ambien. Refill placed.  3. Need for immunization against influenza - Flu Vaccine QUAD 36+ mos IM  This visit occurred during the SARS-CoV-2 public health emergency.  Safety protocols were in place, including screening questions prior to the visit, additional usage of staff PPE, and extensive cleaning of exam  room while observing appropriate contact time as indicated for disinfecting solutions.     Leeanne Rio, PA-C

## 2019-11-28 LAB — COMPREHENSIVE METABOLIC PANEL
ALT: 19 U/L (ref 0–35)
AST: 20 U/L (ref 0–37)
Albumin: 4.3 g/dL (ref 3.5–5.2)
Alkaline Phosphatase: 116 U/L (ref 39–117)
BUN: 10 mg/dL (ref 6–23)
CO2: 34 mEq/L — ABNORMAL HIGH (ref 19–32)
Calcium: 9.1 mg/dL (ref 8.4–10.5)
Chloride: 100 mEq/L (ref 96–112)
Creatinine, Ser: 0.84 mg/dL (ref 0.40–1.20)
GFR: 75.48 mL/min (ref >60.00)
Glucose, Bld: 96 mg/dL (ref 70–99)
Potassium: 3.9 mEq/L (ref 3.5–5.1)
Sodium: 139 mEq/L (ref 135–145)
Total Bilirubin: 0.3 mg/dL (ref 0.2–1.2)
Total Protein: 7 g/dL (ref 6.0–8.3)

## 2019-11-28 LAB — CBC WITH DIFFERENTIAL/PLATELET
Basophils Absolute: 0.1 10*3/uL (ref 0.0–0.1)
Basophils Relative: 0.6 % (ref 0.0–3.0)
Eosinophils Absolute: 0.3 10*3/uL (ref 0.0–0.7)
Eosinophils Relative: 2.9 % (ref 0.0–5.0)
HCT: 37.5 % (ref 36.0–46.0)
Hemoglobin: 12.5 g/dL (ref 12.0–15.0)
Lymphocytes Relative: 33 % (ref 12.0–46.0)
Lymphs Abs: 2.8 10*3/uL (ref 0.7–4.0)
MCHC: 33.2 g/dL (ref 30.0–36.0)
MCV: 93.6 fl (ref 78.0–100.0)
Monocytes Absolute: 0.7 10*3/uL (ref 0.1–1.0)
Monocytes Relative: 8.6 % (ref 3.0–12.0)
Neutro Abs: 4.7 10*3/uL (ref 1.4–7.7)
Neutrophils Relative %: 54.9 % (ref 43.0–77.0)
Platelets: 238 10*3/uL (ref 150.0–400.0)
RBC: 4.01 Mil/uL (ref 3.87–5.11)
RDW: 13.1 % (ref 11.5–15.5)
WBC: 8.6 10*3/uL (ref 4.0–10.5)

## 2019-11-28 LAB — LIPID PANEL
Cholesterol: 175 mg/dL (ref 0–200)
HDL: 53.7 mg/dL (ref >39.00)
LDL Cholesterol: 95 mg/dL (ref 0–99)
NonHDL: 121.37
Total CHOL/HDL Ratio: 3
Triglycerides: 131 mg/dL (ref 0.0–149.0)
VLDL: 26.2 mg/dL (ref 0.0–40.0)

## 2019-11-28 LAB — LIPASE: Lipase: 38 U/L (ref 11.0–59.0)

## 2019-11-28 LAB — URINALYSIS, MICROSCOPIC ONLY

## 2019-12-01 MED ORDER — ZOLPIDEM TARTRATE 10 MG PO TABS: 10.0000 mg | ORAL_TABLET | Freq: Every evening | ORAL | 0 refills | Status: DC | PRN

## 2019-12-03 ENCOUNTER — Telehealth: Payer: Self-pay

## 2019-12-03 ENCOUNTER — Other Ambulatory Visit: Payer: Self-pay

## 2019-12-03 ENCOUNTER — Ambulatory Visit: Payer: Managed Care, Other (non HMO) | Admitting: Obstetrics and Gynecology

## 2019-12-03 ENCOUNTER — Encounter: Payer: Self-pay | Admitting: Obstetrics and Gynecology

## 2019-12-03 VITALS — BP 126/72 | HR 88 | Ht 70.0 in | Wt 207.0 lb

## 2019-12-03 DIAGNOSIS — N763 Subacute and chronic vulvitis: Secondary | ICD-10-CM | POA: Diagnosis not present

## 2019-12-03 NOTE — Progress Notes (Signed)
GYNECOLOGY  VISIT   HPI: 59 y.o.   Single Black or African American Not Hispanic or Latino  female   [phone] with No LMP recorded. Patient has had a hysterectomy.   here for a 2 week follow up for vulvitis. She says that the itching is better. She says that she is not scratching as much.  She was seen on 11/15/19 with severe vulvitis and treated with mycolog and atarax. Nuswab was + for candida glabrata and she was started on boric acid, she has 3 pills left.   The itching is 80% better, more itching at night. Sometimes wakes up and is scratching. She is taking atarax 2 x a day has helped.   GYNECOLOGIC HISTORY: No LMP recorded. Patient has had a hysterectomy. Contraception: NA   Menopausal hormone therapy: none         OB History    Gravida  3   Para      Term      Preterm      AB  3   Living  0     SAB  3   TAB      Ectopic      Multiple      Live Births                 Patient Active Problem List   Diagnosis Date Noted  . Hyperlipidemia 11/06/2017  . Need for immunization against influenza 11/06/2017  . Breast cancer screening 04/28/2017  . Carotid atherosclerosis, bilateral 04/28/2017  . Insomnia 07/28/2015  . Screening for ischemic heart disease 01/02/2014  . Screening for osteoporosis 01/02/2014  . Primary osteoarthritis of both knees 09/26/2013  . ESOPHAGEAL STRICTURE 10/21/2008  . GERD 10/21/2008  . PERSONAL HX COLONIC POLYPS 10/21/2008  . VULVAR ATROPHY 12/28/2006  . TOBACCO ABUSE 03/07/2006  . Essential hypertension 03/07/2006  . OVARIAN CYST 03/07/2006    Past Medical History:  Diagnosis Date  . Adenomatous colon polyp   . Anemia   . Diverticulosis   . Esophageal stricture   . Fluid in knee    Bilateral  . GERD (gastroesophageal reflux disease)   . Hemorrhagic ovarian cyst 01/2006   Right  . Hemorrhoids   . Hiatal hernia   . HTN (hypertension)   . Hyperplastic colon polyp   . Hypokalemia 06/2005   2nd to diuretic   .  Osteoarthritis, knee    Bilateral  . Pneumonia   . Pyelonephritis 03/2295   Uncomplicated  . Recurrent boils    Underarms  . Tobacco abuse   . UTI (lower urinary tract infection)   . 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 ABDOMINAL HYSTERECTOMY  1998   Dr Jodi Mourning for fibroids (supracervical)  . BREAST BIOPSY    . BREAST CYST EXCISION     Right    Current Outpatient Medications  Medication Sig Dispense Refill  . atorvastatin (LIPITOR) 20 MG tablet Take 1 tablet (20 mg total) by mouth daily. 90 tablet 1  . hydrOXYzine (ATARAX/VISTARIL) 10 MG tablet Take 1 tablet (10 mg total) by mouth 3 (three) times daily as needed. 30 tablet 0  . lisinopril (ZESTRIL) 20 MG tablet Take 1 tablet by mouth once daily 90 tablet 1  . NONFORMULARY OR COMPOUNDED ITEM compounded boric acid, 600 mg, 1 tablet VAGINALLY qhs x 2 weeks. 1 each 0  . nystatin-triamcinolone ointment (MYCOLOG) Apply 1 application topically 2 (two) times daily. Apply BID for  up to 7 days. 60 g 0  . pantoprazole (PROTONIX) 40 MG tablet Take 1 tablet by mouth once daily 90 tablet 2  . triamterene-hydrochlorothiazide (MAXZIDE-25) 37.5-25 MG tablet Take 1 tablet by mouth once daily 90 tablet 1  . zolpidem (AMBIEN) 10 MG tablet Take 1 tablet (10 mg total) by mouth at bedtime as needed. for sleep 30 tablet 0   No current facility-administered medications for this visit.     ALLERGIES: Patient has no known allergies.  Family History  Problem Relation Age of Onset  . Hypertension Father        Deceased  . Liver disease Father        Cirrhosis  . Kidney disease Maternal Aunt   . Healthy Mother        Living  . Thyroid disease Mother   . Diabetes Maternal Aunt        x2  . Lung cancer Sister   . Bladder Cancer Sister     Social History   Socioeconomic History  . Marital status: Single    Spouse name: Not on file  . Number of children: 0  . Years of education: 12th grade  . Highest  education level: Not on file  Occupational History  . 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[employer]  . Occupation: Public house manager: [email or web address]  Tobacco Use  . Smoking status: Current Every Day Smoker    Packs/day: 0.50    Years: 30.00    Pack years: 15.00    Types: Cigarettes  . Smokeless tobacco: Never Used  Vaping Use  . Vaping Use: Never used  Substance and Sexual Activity  . Alcohol use: Yes    Alcohol/week: 12.0 standard drinks    Types: 12 Cans of beer per week    Comment: 1 twelve pack a weekend  . Drug use: No  . Sexual activity: Yes    Partners: Male    Birth control/protection: Surgical    Comment: hysterectomy  Other Topics Concern  . Not on file  Social History Narrative   Lives by herself, has stable partner   Right handed   Social Determinants of Health   Financial Resource Strain:   . Difficulty of Paying Living Expenses: Not on file  Food Insecurity:   . Worried About Charity fundraiser in the Last Year: Not on file  . Ran Out of Food in the Last Year: Not on file  Transportation Needs:   . Lack of Transportation (Medical): Not on file  . Lack of Transportation (Non-Medical): Not on file  Physical Activity:   . Days of Exercise per Week: Not on file  . Minutes of Exercise per Session: Not on file  Stress:   . Feeling of Stress : Not on file  Social Connections:   . Frequency of Communication with Friends and Family: Not on file  . Frequency of Social Gatherings with Friends and Family: Not on file  . Attends Religious Services: Not on file  . Active Member of Clubs or Organizations: Not on file  . Attends Archivist Meetings: Not on file  . Marital Status: Not on file  Intimate Partner Violence:   . Fear of Current or Ex-Partner: Not on file  . Emotionally Abused: Not on file  . Physically Abused: Not on file  . Sexually Abused: Not on file    Review of Systems  All other systems reviewed and are negative.   PHYSICAL  EXAMINATION:    There were no vitals taken for this visit.    General appearance: alert, cooperative and appears stated age   Pelvic: External genitalia:  Thick, whitening above the clitoris, whitening on bilateral inguinal folds. The labia majora has improved, some fissures on the perineum. No longer swollen or erythematous.               Urethra:  normal appearing urethra with no masses, tenderness or lesions              Bartholins and Skenes: normal                 Vagina: normal appearing vagina with normal color and discharge, no lesions               Chaperone was present for exam.  ASSESSMENT H/O severe vulvitis, definitely improving, but not gone Candida intertrigo in inguinal region She has almost completed boric acid for candida glabrata     PLAN Complete her full course of boric acid Use the mycolog ointment BID for the next 2 weeks Just use the atarax at night, hopeful can stop using it in the next week F/U in 2 weeks.  Use Vaseline as needed when not using the mycolog

## 2019-12-03 NOTE — Telephone Encounter (Signed)
Patient needs to schedule a 2 week recheck with Dr. Talbert Nan.

## 2019-12-03 NOTE — Telephone Encounter (Signed)
Spoke with pt. Pt scheduled for 2 week follow up per Dr Talbert Nan on 12/16/19 at 415 pm. Pt agreeable and verbalized understanding to date and time of appt.  Encounter closed

## 2019-12-04 ENCOUNTER — Telehealth: Payer: Self-pay | Admitting: Physician Assistant

## 2019-12-04 NOTE — Telephone Encounter (Signed)
Please call with test results

## 2019-12-05 ENCOUNTER — Other Ambulatory Visit: Payer: Self-pay | Admitting: Emergency Medicine

## 2019-12-05 DIAGNOSIS — R319 Hematuria, unspecified: Secondary | ICD-10-CM

## 2019-12-05 DIAGNOSIS — G8929 Other chronic pain: Secondary | ICD-10-CM

## 2019-12-05 NOTE — Telephone Encounter (Signed)
Called patient back with lab results. Orders placed.

## 2019-12-10 ENCOUNTER — Other Ambulatory Visit: Payer: Self-pay | Admitting: Physician Assistant

## 2019-12-10 DIAGNOSIS — R319 Hematuria, unspecified: Secondary | ICD-10-CM

## 2019-12-10 DIAGNOSIS — R311 Benign essential microscopic hematuria: Secondary | ICD-10-CM

## 2019-12-16 ENCOUNTER — Encounter: Payer: Self-pay | Admitting: Obstetrics and Gynecology

## 2019-12-16 ENCOUNTER — Ambulatory Visit (INDEPENDENT_AMBULATORY_CARE_PROVIDER_SITE_OTHER): Payer: Managed Care, Other (non HMO) | Admitting: Obstetrics and Gynecology

## 2019-12-16 ENCOUNTER — Other Ambulatory Visit: Payer: Self-pay

## 2019-12-16 VITALS — BP 138/64 | HR 69 | Ht 70.0 in | Wt 208.0 lb

## 2019-12-16 DIAGNOSIS — L989 Disorder of the skin and subcutaneous tissue, unspecified: Secondary | ICD-10-CM | POA: Diagnosis not present

## 2019-12-16 DIAGNOSIS — B372 Candidiasis of skin and nail: Secondary | ICD-10-CM

## 2019-12-16 MED ORDER — NYSTATIN 100000 UNIT/GM EX OINT: 1.0000 "application " | TOPICAL_OINTMENT | Freq: Two times a day (BID) | CUTANEOUS | 1 refills | Status: DC

## 2019-12-16 NOTE — Progress Notes (Signed)
GYNECOLOGY  VISIT   HPI: 59 y.o.   Single Black or African American Not Hispanic or Latino  female   [phone] with No LMP recorded. Patient has had a hysterectomy.   here for follow up for vulvitis. Patient was seen on 11/15/19 for severe vlvitis and treated with mycolog and atatax. Nuswab was + for candida glabrata and she started boric acid. At her last follow up on 12/03/19 she states that itching was 80% better. She still had 3 boric acid pills left and was still waking up at night scratching. She says that today she is feeling better. She says that the itching seems to start when she takes her clothing off to shower.   She now has a rash under her breast that smells sour. She always has some irritation under her breasts, worse in the last week. It is itchy and has an odor. She has gained weight, not sure her bra's are fitting as well.   GYNECOLOGIC HISTORY: No LMP recorded. Patient has had a hysterectomy. Contraception:PMP Menopausal hormone therapy: none         OB History    Gravida  3   Para      Term      Preterm      AB  3   Living  0     SAB  3   TAB      Ectopic      Multiple      Live Births                 Patient Active Problem List   Diagnosis Date Noted  . Hyperlipidemia 11/06/2017  . Need for immunization against influenza 11/06/2017  . Breast cancer screening 04/28/2017  . Carotid atherosclerosis, bilateral 04/28/2017  . Insomnia 07/28/2015  . Screening for ischemic heart disease 01/02/2014  . Screening for osteoporosis 01/02/2014  . Primary osteoarthritis of both knees 09/26/2013  . ESOPHAGEAL STRICTURE 10/21/2008  . GERD 10/21/2008  . PERSONAL HX COLONIC POLYPS 10/21/2008  . VULVAR ATROPHY 12/28/2006  . TOBACCO ABUSE 03/07/2006  . Essential hypertension 03/07/2006  . OVARIAN CYST 03/07/2006    Past Medical History:  Diagnosis Date  . Adenomatous colon polyp   . Anemia   . Diverticulosis   . Esophageal stricture   . Fluid in knee     Bilateral  . GERD (gastroesophageal reflux disease)   . Hemorrhagic ovarian cyst 01/2006   Right  . Hemorrhoids   . Hiatal hernia   . HTN (hypertension)   . Hyperplastic colon polyp   . Hypokalemia 06/2005   2nd to diuretic   . Osteoarthritis, knee    Bilateral  . Pneumonia   . Pyelonephritis 0/5397   Uncomplicated  . Recurrent boils    Underarms  . Tobacco abuse   . UTI (lower urinary tract infection)   . 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 ABDOMINAL HYSTERECTOMY  1998   Dr Jodi Mourning for fibroids (supracervical)  . BREAST BIOPSY    . BREAST CYST EXCISION     Right    Current Outpatient Medications  Medication Sig Dispense Refill  . atorvastatin (LIPITOR) 20 MG tablet Take 1 tablet (20 mg total) by mouth daily. 90 tablet 1  . hydrOXYzine (ATARAX/VISTARIL) 10 MG tablet Take 1 tablet (10 mg total) by mouth 3 (three) times daily as needed. 30 tablet 0  . lisinopril (ZESTRIL) 20 MG tablet Take 1 tablet by mouth  once daily 90 tablet 1  . NONFORMULARY OR COMPOUNDED ITEM compounded boric acid, 600 mg, 1 tablet VAGINALLY qhs x 2 weeks. 1 each 0  . nystatin-triamcinolone ointment (MYCOLOG) Apply 1 application topically 2 (two) times daily. Apply BID for up to 7 days. 60 g 0  . pantoprazole (PROTONIX) 40 MG tablet Take 1 tablet by mouth once daily 90 tablet 2  . triamterene-hydrochlorothiazide (MAXZIDE-25) 37.5-25 MG tablet Take 1 tablet by mouth once daily 90 tablet 1  . zolpidem (AMBIEN) 10 MG tablet Take 1 tablet (10 mg total) by mouth at bedtime as needed. for sleep 30 tablet 0   No current facility-administered medications for this visit.     ALLERGIES: Patient has no known allergies.  Family History  Problem Relation Age of Onset  . Hypertension Father        Deceased  . Liver disease Father        Cirrhosis  . Kidney disease Maternal Aunt   . Healthy Mother        Living  . Thyroid disease Mother   . Diabetes Maternal  Aunt        x2  . Lung cancer Sister   . Bladder Cancer Sister     Social History   Socioeconomic History  . Marital status: Single    Spouse name: Not on file  . Number of children: 0  . Years of education: 12th grade  . Highest education level: Not on file  Occupational History  . 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 Occupation: Public house manager: [email or web address]  Tobacco Use  . Smoking status: Current Every Day Smoker    Packs/day: 0.50    Years: 30.00    Pack years: 15.00    Types: Cigarettes  . Smokeless tobacco: Never Used  Vaping Use  . Vaping Use: Never used  Substance and Sexual Activity  . Alcohol use: Yes    Alcohol/week: 12.0 standard drinks    Types: 12 Cans of beer per week    Comment: 1 twelve pack a weekend  . Drug use: No  . Sexual activity: Yes    Partners: Male    Birth control/protection: Surgical    Comment: hysterectomy  Other Topics Concern  . Not on file  Social History Narrative   Lives by herself, has stable partner   Right handed   Social Determinants of Health   Financial Resource Strain:   . Difficulty of Paying Living Expenses: Not on file  Food Insecurity:   . Worried About Charity fundraiser in the Last Year: Not on file  . Ran Out of Food in the Last Year: Not on file  Transportation Needs:   . Lack of Transportation (Medical): Not on file  . Lack of Transportation (Non-Medical): Not on file  Physical Activity:   . Days of Exercise per Week: Not on file  . Minutes of Exercise per Session: Not on file  Stress:   . Feeling of Stress : Not on file  Social Connections:   . Frequency of Communication with Friends and Family: Not on file  . Frequency of Social Gatherings with Friends and Family: Not on file  . Attends Religious Services: Not on file  . Active Member of Clubs or Organizations: Not on file  . Attends Archivist Meetings: Not on file  . Marital Status: Not on file  Intimate Partner  Violence:   . Fear of Current or  Ex-Partner: Not on file  . Emotionally Abused: Not on file  . Physically Abused: Not on file  . Sexually Abused: Not on file    Review of Systems  Genitourinary:       Vaginal itching.   Skin: Positive for itching and rash.  All other systems reviewed and are negative.   PHYSICAL EXAMINATION:    BP 138/64   Pulse 69   Ht 5\' 10"  (1.778 m)   Wt 208 lb (94.3 kg)   SpO2 99%   BMI 29.84 kg/m     General appearance: alert, cooperative and appears stated age Skin: under both breasts she has a rash with increased pigmentation and skin breakdown. Bilateral groin with continued rash, improving, less white. She has a thick, scaly rash on her left elbow, right lower back and at her upper buttocks.   Pelvic: External genitalia:  no lesions, the whitening above the clitoris has improved.               Urethra:  normal appearing urethra with no masses, tenderness or lesions              Bartholins and Skenes: normal                   Chaperone was present for exam.  ASSESSMENT Candida intertrigo under breast and bilateral groin Multiple skin issues Whitening above the clitoris has improved    PLAN Nystatin ointment under breasts and in bilateral groin She should stop using the steroid ointment regularly to the vulva, can use it 1-2 x a week as needed. Can use Vaseline daily Recommended she f/u with Dermatology, #'s given F/U here prn

## 2019-12-16 NOTE — Patient Instructions (Signed)

## 2019-12-23 ENCOUNTER — Ambulatory Visit
Admission: RE | Admit: 2019-12-23 | Discharge: 2019-12-23 | Disposition: A | Discharge status: Home / Self Care | Payer: Managed Care, Other (non HMO) | Source: Ambulatory Visit | Attending: Physician Assistant | Admitting: Physician Assistant

## 2019-12-23 ENCOUNTER — Other Ambulatory Visit: Payer: Managed Care, Other (non HMO)

## 2019-12-23 DIAGNOSIS — R319 Hematuria, unspecified: Secondary | ICD-10-CM

## 2020-01-06 ENCOUNTER — Other Ambulatory Visit: Payer: Self-pay | Admitting: Physician Assistant

## 2020-01-06 DIAGNOSIS — I10 Essential (primary) hypertension: Secondary | ICD-10-CM

## 2020-01-06 NOTE — Telephone Encounter (Signed)
Pt called in asking for a 90 day supply, she uses Paediatric nurse on [address]

## 2020-01-07 ENCOUNTER — Encounter: Payer: Self-pay | Admitting: Nurse Practitioner

## 2020-01-23 ENCOUNTER — Other Ambulatory Visit: Payer: Self-pay | Admitting: Physician Assistant

## 2020-01-23 NOTE — Telephone Encounter (Signed)
Last refill: 12/01/19 #30, 0 Last OV: 11/27/19 dx. Chronic abdominal pain

## 2020-01-27 ENCOUNTER — Ambulatory Visit: Payer: Managed Care, Other (non HMO) | Admitting: Neurology

## 2020-02-03 ENCOUNTER — Encounter: Payer: Self-pay | Admitting: Nurse Practitioner

## 2020-02-03 ENCOUNTER — Ambulatory Visit: Payer: Managed Care, Other (non HMO) | Admitting: Nurse Practitioner

## 2020-02-03 VITALS — BP 138/70 | HR 96 | Ht 68.5 in | Wt 208.0 lb

## 2020-02-03 DIAGNOSIS — K5900 Constipation, unspecified: Secondary | ICD-10-CM

## 2020-02-03 DIAGNOSIS — R1084 Generalized abdominal pain: Secondary | ICD-10-CM | POA: Diagnosis not present

## 2020-02-03 NOTE — Patient Instructions (Signed)
If you are age 60 or older, your body mass index should be between 23-30. Your Body mass index is 31.17 kg/m. If this is out of the aforementioned range listed, please consider follow up with your Primary Care Provider.  If you are age 80 or younger, your body mass index should be between 19-25. Your Body mass index is 31.17 kg/m. If this is out of the aformentioned range listed, please consider follow up with your Primary Care Provider.   Your provider has requested that you go to the basement level for lab work before leaving today. Press "B" on the elevator. The lab is located at the first door on the left as you exit the elevator.  Start Miralax 1 capful nightly as tolerated to increase stool output.   GasX 1 tablet twice daily.   Call office if symptoms get worse.   Follow up with Dr. Henrene Pastor in 6 weeks.  You have been scheduled for a CT scan of the abdomen and pelvis at [facility] ([address]---this is in the same building as [employer]).   You are scheduled on Thursday 02/20/20 at 9 am. You should arrive 15 minutes prior to your appointment time for registration. Please follow the written instructions below on the day of your exam:  WARNING: IF YOU ARE ALLERGIC TO IODINE/X-RAY DYE, PLEASE NOTIFY RADIOLOGY IMMEDIATELY AT [phone]! YOU WILL BE GIVEN A 13 HOUR PREMEDICATION PREP.  1) Do not eat or drink anything after 6 am (4 hours prior to your test) 2) You have been given 2 bottles of oral contrast to drink. The solution may taste better if refrigerated, but do NOT add ice or any other liquid to this solution. Shake well before drinking.    Drink 1 bottle of contrast @ 7 am (2 hours prior to your exam)  Drink 1 bottle of contrast @ 8 am (1 hour prior to your exam)  You may take any medications as prescribed with a small amount of water, if necessary. If you take any of the following medications: METFORMIN, GLUCOPHAGE, GLUCOVANCE, AVANDAMET, RIOMET,  FORTAMET, [place] MET, JANUMET, GLUMETZA or METAGLIP, you MAY be asked to HOLD this medication 48 hours AFTER the exam.  The purpose of you drinking the oral contrast is to aid in the visualization of your intestinal tract. The contrast solution may cause some diarrhea. Depending on your individual set of symptoms, you may also receive an intravenous injection of x-ray contrast/dye. Plan on being at [facility] for 30 minutes or longer, depending on the type of exam you are having performed.  This test typically takes 30-45 minutes to complete.  If you have any questions regarding your exam or if you need to reschedule, you may call the CT department at [phone] between the hours of 8:00 am and 5:00 pm, Monday-Friday.  ______________________________________________________________

## 2020-02-03 NOTE — Progress Notes (Signed)
02/03/2020 Darlene Gonzalez [id number] 09-10-1960   CHIEF COMPLAINT: Abdominal pain   HISTORY OF PRESENT ILLNESS: Darlene Gonzalez is a 59 year old female with a past medical history of hypertension, chronic headaches, GERD and colon polyps.  She presents to our office today as referred by Raiford Noble, PA-C for further evaluation regarding abdominal pain.  She reports having generalized abdominal pain which started 6 to 7 months ago.  Her generalized abdominal pain is sharp and sometimes is a throbbing pain.  Her abdominal pain last 8 to 24 hours a day.  She has mild nausea once every 2 weeks.  No vomiting.  She is passing a soft formed bowel movement 3-4 times weekly.  She saw a small amount of red blood with a small blood clot on the toilet tissue after straining to pass a bowel movement 3 days ago.  No further rectal bleeding since that time.  She complains of having abdominal gas and bloat.  No fever, sweats or chills.  No weight loss.  No dysphagia or heartburn.  She is taking pantoprazole 40 mg 3 days weekly, daily dosing was too expensive.  Eating sometimes worsens her abdominal pain in the morning.  Her abdominal pain does not awaken her from sleep.  No NSAID use.  She is taking turmeric for the past 2 weeks for joint pain.  She has history of recent microscopic hematuria.  Underwent renal protocol CT without contrast on 12/23/2019 doubt evidence of kidney stones, diffuse colonic diverticulosis without evidence of diverticulitis was present and a fat-containing umbilical hernia was noted.  Her most recent EGD Dr. Henrene Pastor showed GERD with mild esophagitis and the esophagus was dilated.  A colonoscopy was done on the same date which identified moderate diverticulosis throughout the entire colon, no polyps.  A repeat colonoscopy in 10 years was recommended.  Laboratory studies 11/27/2019: WBC 8.6.  Hemoglobin 12.5.  Hematocrit 37.5.  MCV 93.6.  Platelet 238.  Sodium 139.  Potassium 3.9.  Glucose  96.  BUN 10.  Creatinine 0.84.  Alk phos 116.  Albumin 4.3.  Lipase 38.  AST 20.  ALT 19.  Total bili 0.3.  EGD 03/20/2014: 1. GERD with mild esophagitis 2. Esophageal stricture status post dilation  Colonoscopy 03/20/2014: 1. Moderate diverticulosis was noted throughout the entire examined colon 2. The examination was otherwise normal  Renal stone CT without contrast 12/23/2019: 1. No urinary tract calculi or obstructive uropathy to explain the patient's hematuria. If hematuria remains unexplained after cystoscopy, or if cystoscopy is not planned, dedicated renal CT or MRI may be useful. 2. Diffuse colonic diverticulosis without diverticulitis. 3. Fat containing umbilical hernia. 4. Aortic Atherosclerosis   Past Medical History:  Diagnosis Date  . Adenomatous colon polyp   . Anemia   . Chronic headaches   . Diverticulosis   . Esophageal stricture   . Fluid in knee    Bilateral  . GERD (gastroesophageal reflux disease)   . Hemorrhagic ovarian cyst 01/2006   Right  . Hemorrhoids   . Hiatal hernia   . HTN (hypertension)   . Hyperplastic colon polyp   . Hypokalemia 06/2005   2nd to diuretic   . Osteoarthritis, knee    Bilateral  . Pneumonia   . Pyelonephritis 05/1658   Uncomplicated  . Recurrent boils    Underarms  . Tobacco abuse   . UTI (lower urinary tract infection)   . 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  ABDOMINAL HYSTERECTOMY  1998   Dr Jodi Mourning for fibroids (supracervical)  . BREAST BIOPSY    . BREAST CYST EXCISION Right    Social History: She is single.  She smokes cigarettes 1/2 ppp x 40 years. She drinks 12 beers every Sat, Sun and  Monday. No drug use.   Family History: Father with history of hypertension and liver disease.  Mother with history of thyroid disease.  Sister died 86 from lung cancer. Sister with history of bladder cancer.   No Known Allergies    Outpatient Encounter Medications as of 02/03/2020   Medication Sig  . atorvastatin (LIPITOR) 20 MG tablet Take 1 tablet (20 mg total) by mouth daily.  . hydrOXYzine (ATARAX/VISTARIL) 10 MG tablet Take 1 tablet (10 mg total) by mouth 3 (three) times daily as needed.  Marland Kitchen lisinopril (ZESTRIL) 20 MG tablet Take 1 tablet by mouth once daily  . NONFORMULARY OR COMPOUNDED ITEM compounded boric acid, 600 mg, 1 tablet VAGINALLY qhs x 2 weeks.  . nystatin ointment (MYCOSTATIN) Apply 1 application topically 2 (two) times daily. Apply to affected area for up to 7 days.  Marland Kitchen nystatin-triamcinolone ointment (MYCOLOG) Apply 1 application topically 2 (two) times daily. Apply BID for up to 7 days.  . pantoprazole (PROTONIX) 40 MG tablet Take 1 tablet by mouth once daily  . triamterene-hydrochlorothiazide (MAXZIDE-25) 37.5-25 MG tablet Take 1 tablet by mouth once daily  . zolpidem (AMBIEN) 10 MG tablet TAKE 1 TABLET BY MOUTH AT BEDTIME AS NEEDED FOR SLEEP   No facility-administered encounter medications on file as of 02/03/2020.     REVIEW OF SYSTEMS:  Gen: Denies fever, sweats or chills. No weight loss.  CV: Denies chest pain, palpitations or edema. Resp: Denies cough, shortness of breath of hemoptysis.  GI: See HPI. GU : + microscopic hematuria.  Denies urinary burning, blood in urine, increased urinary frequency. MS: Denies joint pain, muscles aches or weakness. Derm: Denies rash, itchiness, skin lesions or unhealing ulcers. Psych: Denies depression, anxiety or memory loss. Heme: Denies bruising, bleeding. Neuro:  Denies headaches, dizziness or paresthesias. Endo:  Denies any problems with DM, thyroid or adrenal function.  PHYSICAL EXAM: BP 138/70 (BP Location: Left Arm, Patient Position: Sitting, Cuff Size: Normal)   Pulse 96   Ht 5' 8.5" (1.74 m) Comment: height measured without shoes  Wt 208 lb (94.3 kg)   BMI 31.17 kg/m  General: Well developed 59 year old female in no acute distress. Head: Normocephalic and atraumatic. Eyes:  Sclerae  non-icteric, conjunctive pink. Ears: Normal auditory acuity. Mouth: Dentition intact. No ulcers or lesions.  Neck: Supple, no lymphadenopathy or thyromegaly.  Lungs: Clear bilaterally to auscultation without wheezes, crackles or rhonchi. Heart: Regular rate and rhythm. No murmur, rub or gallop appreciated.  Abdomen: Soft, distended.  Moderate generalized abdominal tenderness without rebound or guarding.  No hepatosplenomegaly. Normoactive bowel sounds x 4 quadrants.  Rectal: A dry open linear fissure between the gluteal cleft which extends to above the anus.  The anal derm folds are thickened with multiple superficial fissures, small lichen sclerosis component.  Small internal hemorrhoids palpated on exam without prolapse.  Soft brown stool in the rectum which was guaiac negative.  Sophia CMA present during exam. Musculoskeletal: Symmetrical with no gross deformities. Skin: Warm and dry. No rash or lesions on visible extremities. Extremities: No edema. Neurological: Alert oriented x 4, no focal deficits.  Psychological:  Alert and cooperative. Normal mood and affect.  ASSESSMENT AND PLAN:  58.  59 year old female with  constipation, abdominal gas bloat and generalized abdominal pain.  Noncontrast renal CT 12/23/2019 unrevealing. -CBC, CMP and CRP -Abdominal/pelvic CT with oral and IV contrast -Gas-X 1 tab twice daily -MiraLAX to at bedtime to increase stool output -Patient to call her office if symptoms worsen  2.  Rectal bleeding, small internal hemorrhoids and inflammatory derm process with fissuring present between gluteal folds which extends to the anus.  Patient reported seeing a dermatologist for this issue and was prescribed a topical cream for psoriasis. -Patient to continue follow-up with dermatologist for gluteal/anal Derm inflammatory process possibly psoriasis versus lichen sclerosis -Avoid aggressive wiping after BM, use unscented Dove soap when bathing -Patient will follow up  with Dr. Henrene Pastor in the office in 6 weeks and he will further verify if a repeat colonoscopy warranted at that point.  Last colonoscopy in 2016 showed diverticulosis, no evidence of polyps.    CC:  Brunetta Jeans, PA-C

## 2020-02-05 NOTE — Progress Notes (Signed)
Assessment and plan reviewed 

## 2020-02-07 ENCOUNTER — Telehealth: Payer: Self-pay | Admitting: *Deleted

## 2020-02-07 NOTE — Telephone Encounter (Signed)
[facility] CT reached out and patient needs blood test prior to CT scan on 12/27. Patient wast suppose to get blood test at office visit on 12/13, will call patient on Monday for her to get blood test before 12/27.

## 2020-02-10 NOTE — Telephone Encounter (Signed)
Left message for patient to call office.  

## 2020-02-10 NOTE — Telephone Encounter (Signed)
Informed patient to please complete blood test before next Monday. Patient voiced understanding.

## 2020-02-12 ENCOUNTER — Other Ambulatory Visit (INDEPENDENT_AMBULATORY_CARE_PROVIDER_SITE_OTHER): Payer: Managed Care, Other (non HMO)

## 2020-02-12 DIAGNOSIS — R1084 Generalized abdominal pain: Secondary | ICD-10-CM | POA: Diagnosis not present

## 2020-02-12 DIAGNOSIS — K5900 Constipation, unspecified: Secondary | ICD-10-CM | POA: Diagnosis not present

## 2020-02-12 LAB — COMPREHENSIVE METABOLIC PANEL
ALT: 16 U/L (ref 0–35)
AST: 19 U/L (ref 0–37)
Albumin: 4.4 g/dL (ref 3.5–5.2)
Alkaline Phosphatase: 107 U/L (ref 39–117)
BUN: 10 mg/dL (ref 6–23)
CO2: 33 mEq/L — ABNORMAL HIGH (ref 19–32)
Calcium: 9.3 mg/dL (ref 8.4–10.5)
Chloride: 98 mEq/L (ref 96–112)
Creatinine, Ser: 0.76 mg/dL (ref 0.40–1.20)
GFR: 85.22 mL/min (ref >60.00)
Glucose, Bld: 91 mg/dL (ref 70–99)
Potassium: 3.7 mEq/L (ref 3.5–5.1)
Sodium: 137 mEq/L (ref 135–145)
Total Bilirubin: 0.4 mg/dL (ref 0.2–1.2)
Total Protein: 7.5 g/dL (ref 6.0–8.3)

## 2020-02-12 LAB — CBC WITH DIFFERENTIAL/PLATELET
Basophils Absolute: 0 10*3/uL (ref 0.0–0.1)
Basophils Relative: 0.6 % (ref 0.0–3.0)
Eosinophils Absolute: 0.2 10*3/uL (ref 0.0–0.7)
Eosinophils Relative: 2.9 % (ref 0.0–5.0)
HCT: 38.8 % (ref 36.0–46.0)
Hemoglobin: 13 g/dL (ref 12.0–15.0)
Lymphocytes Relative: 35.6 % (ref 12.0–46.0)
Lymphs Abs: 2.6 10*3/uL (ref 0.7–4.0)
MCHC: 33.5 g/dL (ref 30.0–36.0)
MCV: 91.2 fl (ref 78.0–100.0)
Monocytes Absolute: 0.6 10*3/uL (ref 0.1–1.0)
Monocytes Relative: 7.9 % (ref 3.0–12.0)
Neutro Abs: 3.9 10*3/uL (ref 1.4–7.7)
Neutrophils Relative %: 53 % (ref 43.0–77.0)
Platelets: 245 10*3/uL (ref 150.0–400.0)
RBC: 4.26 Mil/uL (ref 3.87–5.11)
RDW: 13.4 % (ref 11.5–15.5)
WBC: 7.3 10*3/uL (ref 4.0–10.5)

## 2020-02-12 LAB — C-REACTIVE PROTEIN: CRP: <1 mg/dL (ref 0.5–20.0)

## 2020-02-17 ENCOUNTER — Other Ambulatory Visit: Payer: Self-pay

## 2020-02-17 ENCOUNTER — Ambulatory Visit (INDEPENDENT_AMBULATORY_CARE_PROVIDER_SITE_OTHER)
Admission: RE | Admit: 2020-02-17 | Discharge: 2020-02-17 | Disposition: A | Discharge status: Home / Self Care | Payer: Managed Care, Other (non HMO) | Source: Ambulatory Visit | Attending: Nurse Practitioner | Admitting: Nurse Practitioner

## 2020-02-17 DIAGNOSIS — K5900 Constipation, unspecified: Secondary | ICD-10-CM | POA: Diagnosis not present

## 2020-02-17 DIAGNOSIS — R1084 Generalized abdominal pain: Secondary | ICD-10-CM

## 2020-02-17 MED ORDER — IOHEXOL 300 MG/ML  SOLN
100.0000 mL | Freq: Once | INTRAMUSCULAR | Status: AC | PRN
  Administered 2020-02-17: 15:00:00 100 mL via INTRAVENOUS

## 2020-02-20 ENCOUNTER — Other Ambulatory Visit: Payer: Managed Care, Other (non HMO)

## 2020-02-20 ENCOUNTER — Telehealth: Payer: Self-pay | Admitting: Nurse Practitioner

## 2020-02-20 NOTE — Telephone Encounter (Signed)
See 12/30 results note  

## 2020-02-20 NOTE — Telephone Encounter (Signed)
Inbound call from patient returning your call in regards to CT scan results. 

## 2020-03-20 ENCOUNTER — Other Ambulatory Visit: Payer: Self-pay

## 2020-03-20 ENCOUNTER — Ambulatory Visit: Payer: Managed Care, Other (non HMO) | Admitting: Physician Assistant

## 2020-03-20 ENCOUNTER — Encounter: Payer: Self-pay | Admitting: Physician Assistant

## 2020-03-20 DIAGNOSIS — I10 Essential (primary) hypertension: Secondary | ICD-10-CM | POA: Diagnosis not present

## 2020-03-20 DIAGNOSIS — E785 Hyperlipidemia, unspecified: Secondary | ICD-10-CM | POA: Diagnosis not present

## 2020-03-20 MED ORDER — HYDROXYZINE HCL 10 MG PO TABS: 10.0000 mg | ORAL_TABLET | Freq: Three times a day (TID) | ORAL | 0 refills | Status: DC | PRN

## 2020-03-20 MED ORDER — ATORVASTATIN CALCIUM 20 MG PO TABS: 20.0000 mg | ORAL_TABLET | Freq: Every day | ORAL | 1 refills | Status: DC

## 2020-03-20 MED ORDER — TRIAMTERENE-HCTZ 37.5-25 MG PO TABS: 1.0000 | ORAL_TABLET | Freq: Every day | ORAL | 1 refills | Status: DC

## 2020-03-20 MED ORDER — LISINOPRIL 20 MG PO TABS: 20.0000 mg | ORAL_TABLET | Freq: Every day | ORAL | 1 refills | Status: DC

## 2020-03-20 MED ORDER — ZOLPIDEM TARTRATE 10 MG PO TABS: 10.0000 mg | ORAL_TABLET | Freq: Every evening | ORAL | 2 refills | Status: DC | PRN

## 2020-03-20 NOTE — Progress Notes (Signed)
 Patient presents to clinic today to follow-up regarding hypertension, hyperlipidemia.  Patient is currently on a regimen of atorvastatin 20 mg daily, lisinopril 20 mg daily and triamterene/hydrochlorothiazide 37.5-25 mg daily.  Endorses taking medications as directed and tolerating well.  Has done very well previously on this regimen. Patient denies chest pain, palpitations, lightheadedness, dizziness, vision changes or frequent headaches.  Have labs within the past 30 days with stable renal function and normal potassium levels..   Past Medical History:  Diagnosis Date  . Adenomatous colon polyp   . Anemia   . Chronic headaches   . Diverticulosis   . Esophageal stricture   . Fluid in knee    Bilateral  . GERD (gastroesophageal reflux disease)   . Hemorrhagic ovarian cyst 01/2006   Right  . Hemorrhoids   . Hiatal hernia   . HTN (hypertension)   . Hyperplastic colon polyp   . Hypokalemia 06/2005   2nd to diuretic   . Osteoarthritis, knee    Bilateral  . Pneumonia   . Pyelonephritis 06/2005   Uncomplicated  . Recurrent boils    Underarms  . Tobacco abuse   . UTI (lower urinary tract infection)   . 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 atorvastatin (LIPITOR) 20 MG tablet Take 1 tablet (20 mg total) by mouth daily. 90 tablet 1  . hydrOXYzine (ATARAX/VISTARIL) 10 MG tablet Take 1 tablet (10 mg total) by mouth 3 (three) times daily as needed. 30 tablet 0  . lisinopril (ZESTRIL) 20 MG tablet Take 1 tablet by mouth once daily 90 tablet 1  . NONFORMULARY OR COMPOUNDED ITEM compounded boric acid, 600 mg, 1 tablet VAGINALLY qhs x 2 weeks. 1 each 0  . nystatin ointment (MYCOSTATIN) Apply 1 application topically 2 (two) times daily. Apply to affected area for up to 7 days. 30 g 1  . nystatin-triamcinolone ointment (MYCOLOG) Apply 1 application topically 2 (two) times daily. Apply BID for up to 7 days. 60 g 0  .  pantoprazole (PROTONIX) 40 MG tablet Take 1 tablet by mouth once daily 90 tablet 2  . triamterene-hydrochlorothiazide (MAXZIDE-25) 37.5-25 MG tablet Take 1 tablet by mouth once daily 90 tablet 2  . zolpidem (AMBIEN) 10 MG tablet TAKE 1 TABLET BY MOUTH AT BEDTIME AS NEEDED FOR SLEEP 30 tablet 0   No current facility-administered medications on file prior to visit.    No Known Allergies  Family History  Problem Relation Age of Onset  . Hypertension Father        Deceased  . Liver disease Father        Cirrhosis  . Alcohol abuse Father   . Kidney disease Maternal Aunt   . Healthy Mother        Living  . Thyroid disease Mother   . Diabetes Maternal Aunt        x2  . Lung cancer Sister   . Bladder Cancer Sister     Social History   Socioeconomic History  . Marital status: Single    Spouse name: Not on file  . Number of children: 0  . Years of education: 12th grade  . Highest education level: Not on file  Occupational History  . Occupation: Stocker    Employer: [employer]  . Occupation: Deli    Employer: [employer]  Tobacco Use  . Smoking status: Current Every Day Smoker    Packs/day: 0.50      Years: 30.00    Pack years: 15.00    Types: Cigarettes  . Smokeless tobacco: Never Used  Vaping Use  . Vaping Use: Never used  Substance and Sexual Activity  . Alcohol use: Yes    Alcohol/week: 12.0 standard drinks    Types: 12 Cans of beer per week    Comment: 1 twelve pack a weekend  . Drug use: No  . Sexual activity: Yes    Partners: Male    Birth control/protection: Surgical    Comment: hysterectomy  Other Topics Concern  . Not on file  Social History Narrative   Lives by herself, has stable partner   Right handed   Social Determinants of Health   Financial Resource Strain: Not on file  Food Insecurity: Not on file  Transportation Needs: Not on file  Physical Activity: Not on file  Stress: Not on file  Social Connections: Not on file   Review of  Systems - See HPI.  All other ROS are negative.  Wt 213 lb (96.6 kg)   BMI 31.92 kg/m   Physical Exam Vitals reviewed.  Constitutional:      Appearance: Normal appearance.  HENT:     Head: Normocephalic and atraumatic.  Cardiovascular:     Rate and Rhythm: Normal rate and regular rhythm.     Heart sounds: Normal heart sounds.  Pulmonary:     Effort: Pulmonary effort is normal.     Breath sounds: Normal breath sounds.  Musculoskeletal:     Cervical back: Neck supple.  Neurological:     General: No focal deficit present.     Mental Status: She is alert and oriented to person, place, and time.  Psychiatric:        Mood and Affect: Mood normal.     Recent Results (from the past 2160 hour(s))  CBC with Differential/Platelet     Status: None   Collection Time: 02/12/20  4:35 PM  Result Value Ref Range   WBC 7.3 4.0 - 10.5 K/uL   RBC 4.26 3.87 - 5.11 Mil/uL   Hemoglobin 13.0 12.0 - 15.0 g/dL   HCT 38.8 36.0 - 46.0 %   MCV 91.2 78.0 - 100.0 fl   MCHC 33.5 30.0 - 36.0 g/dL   RDW 13.4 11.5 - 15.5 %   Platelets 245.0 150.0 - 400.0 K/uL   Neutrophils Relative % 53.0 43.0 - 77.0 %   Lymphocytes Relative 35.6 12.0 - 46.0 %   Monocytes Relative 7.9 3.0 - 12.0 %   Eosinophils Relative 2.9 0.0 - 5.0 %   Basophils Relative 0.6 0.0 - 3.0 %   Neutro Abs 3.9 1.4 - 7.7 K/uL   Lymphs Abs 2.6 0.7 - 4.0 K/uL   Monocytes Absolute 0.6 0.1 - 1.0 K/uL   Eosinophils Absolute 0.2 0.0 - 0.7 K/uL   Basophils Absolute 0.0 0.0 - 0.1 K/uL  Comp Met (CMET)     Status: Abnormal   Collection Time: 02/12/20  4:35 PM  Result Value Ref Range   Sodium 137 135 - 145 mEq/L   Potassium 3.7 3.5 - 5.1 mEq/L   Chloride 98 96 - 112 mEq/L   CO2 33 (H) 19 - 32 mEq/L   Glucose, Bld 91 70 - 99 mg/dL   BUN 10 6 - 23 mg/dL   Creatinine, Ser 0.76 0.40 - 1.20 mg/dL   Total Bilirubin 0.4 0.2 - 1.2 mg/dL   Alkaline Phosphatase 107 39 - 117 U/L   AST 19 0 - 37  U/L   ALT 16 0 - 35 U/L   Total Protein 7.5 6.0 - 8.3  g/dL   Albumin 4.4 3.5 - 5.2 g/dL   GFR 85.22 >60.00 mL/min    Comment: Calculated using the CKD-EPI Creatinine Equation (2021)   Calcium 9.3 8.4 - 10.5 mg/dL  C-reactive protein     Status: None   Collection Time: 02/12/20  4:35 PM  Result Value Ref Range   CRP <1.0 0.5 - 20.0 mg/dL    Assessment/Plan: 1. Essential hypertension BP normotensive.  Asymptomatic.  Recent labs are stable.  Continue current medication regimen. - lisinopril (ZESTRIL) 20 MG tablet; Take 1 tablet (20 mg total) by mouth daily.  Dispense: 90 tablet; Refill: 1 - triamterene-hydrochlorothiazide (MAXZIDE-25) 37.5-25 MG tablet; Take 1 tablet by mouth daily.  Dispense: 90 tablet; Refill: 1  2. Hyperlipidemia, unspecified hyperlipidemia type Continue statin at current dose.  Dietary and exercise recommendations reviewed with patient. - atorvastatin (LIPITOR) 20 MG tablet; Take 1 tablet (20 mg total) by mouth daily.  Dispense: 90 tablet; Refill: 1  This visit occurred during the SARS-CoV-2 public health emergency.  Safety protocols were in place, including screening questions prior to the visit, additional usage of staff PPE, and extensive cleaning of exam room while observing appropriate contact time as indicated for disinfecting solutions.     Leeanne Rio, PA-C

## 2020-03-20 NOTE — Patient Instructions (Addendum)
Please continue chronic medications as directed. Refills have been sent.   Please start a daily nasal steroid OTC -- Nasacort is a good option.  This will help with the nasal symptoms and ear pressure. Call the office if things are not improving.   Take care!!

## 2020-03-30 ENCOUNTER — Ambulatory Visit: Payer: Self-pay | Admitting: Internal Medicine

## 2020-04-07 ENCOUNTER — Ambulatory Visit: Payer: Managed Care, Other (non HMO) | Admitting: Physician Assistant

## 2020-05-11 ENCOUNTER — Ambulatory Visit: Payer: Managed Care, Other (non HMO) | Admitting: Gastroenterology

## 2020-06-24 ENCOUNTER — Ambulatory Visit (INDEPENDENT_AMBULATORY_CARE_PROVIDER_SITE_OTHER): Payer: Managed Care, Other (non HMO) | Admitting: Internal Medicine

## 2020-06-24 ENCOUNTER — Encounter: Payer: Self-pay | Admitting: Internal Medicine

## 2020-06-24 VITALS — BP 140/78 | HR 70 | Ht 68.5 in | Wt 219.0 lb

## 2020-06-24 DIAGNOSIS — K222 Esophageal obstruction: Secondary | ICD-10-CM | POA: Diagnosis not present

## 2020-06-24 DIAGNOSIS — Z720 Tobacco use: Secondary | ICD-10-CM

## 2020-06-24 DIAGNOSIS — K5900 Constipation, unspecified: Secondary | ICD-10-CM

## 2020-06-24 DIAGNOSIS — K2100 Gastro-esophageal reflux disease with esophagitis, without bleeding: Secondary | ICD-10-CM

## 2020-06-24 DIAGNOSIS — R140 Abdominal distension (gaseous): Secondary | ICD-10-CM | POA: Diagnosis not present

## 2020-06-24 MED ORDER — PANTOPRAZOLE SODIUM 40 MG PO TBEC: 40.0000 mg | DELAYED_RELEASE_TABLET | Freq: Every day | ORAL | 3 refills | Status: DC

## 2020-06-24 NOTE — Progress Notes (Signed)
HISTORY OF PRESENT ILLNESS:  Darlene Gonzalez is a 60 y.o. female who presents today regarding abdominal pain.  She was seen in the office by the GI nurse practitioner February 03, 2020 regarding the same.  See that dictation for details.  At that time she reported generalized abdominal pain for about 6 or 7 months.  Blood work from February 12, 2020 reveals unremarkable comprehensive metabolic panel, normal liver tests, normal lipase in October 2021, normal CBC with hemoglobin 13.0.  A CT scan of the abdomen and pelvis with contrast was performed February 17, 2020.  She was found to have diverticulosis.  No other acute findings.  She has undergone colonoscopy on multiple occasions.  Last examination January 2016 revealed moderate diverticulosis but was otherwise normal.  Upper endoscopy was performed at that time and revealed mild esophagitis and esophageal stricture which was dilated.  Patient is on pantoprazole 40 mg daily for GERD.  She states that she is here today for follow-up regarding her CT scan results and address other issues.  First, she requests refill of her pantoprazole.  Off pantoprazole she has indigestion heartburn and regurgitation.  On pantoprazole, her symptoms are controlled.  Next she continues to have issues with constipation.  Moves her bowels once every 3 days.  She did take MiraLAX previously which resulted in loose stools.  Finally, she complains of bloating and gas.  She continues to smoke.  In terms of abdominal pain, she describes fleeting sharp intermittent pain under the rib cage bilaterally.  This is unchanged over years.  REVIEW OF SYSTEMS:  All non-GI ROS negative as otherwise stated in the HPI except for hematuria  Past Medical History:  Diagnosis Date  . Adenomatous colon polyp   . Anemia   . Chronic headaches   . Diverticulosis   . Esophageal stricture   . Fluid in knee    Bilateral  . GERD (gastroesophageal reflux disease)   . Hemorrhagic ovarian cyst 01/2006    Right  . Hemorrhoids   . Hiatal hernia   . HTN (hypertension)   . Hyperplastic colon polyp   . Hypokalemia 06/2005   2nd to diuretic   . Osteoarthritis, knee    Bilateral  . Pneumonia   . Pyelonephritis 02/6965   Uncomplicated  . Recurrent boils    Underarms  . Tobacco abuse   . UTI (lower urinary tract infection)   . 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 ABDOMINAL HYSTERECTOMY  1998   Dr Jodi Mourning for fibroids (supracervical)  . BREAST BIOPSY    . BREAST CYST EXCISION Right     Social History Darlene Gonzalez  reports that she has been smoking cigarettes. She has a 15.00 pack-year smoking history. She has never used smokeless tobacco. She reports current alcohol use of about 12.0 standard drinks of alcohol per week. She reports that she does not use drugs.  family history includes Alcohol abuse in her father; Bladder Cancer in her sister; Diabetes in her maternal aunt; Healthy in her mother; Hypertension in her father; Kidney disease in her maternal aunt; Liver disease in her father; Lung cancer in her sister; Thyroid disease in her mother.  No Known Allergies     PHYSICAL EXAMINATION: Vital signs: BP 140/78   Pulse 70   Ht 5' 8.5" (1.74 m)   Wt 219 lb (99.3 kg)   BMI 32.81 kg/m   Constitutional: generally well-appearing, no acute distress Psychiatric: alert and oriented  x3, cooperative Eyes: extraocular movements intact, anicteric, conjunctiva pink Mouth: oral pharynx moist, no lesions Neck: supple no lymphadenopathy Cardiovascular: heart regular rate and rhythm, no murmur Lungs: clear to auscultation bilaterally Abdomen: soft, obese nontender, nondistended, no obvious ascites, no peritoneal signs, normal bowel sounds, no organomegaly.  There is tenderness with palpation of the rib cage bilaterally Rectal: Omitted Extremities: no clubbing, cyanosis, or lower extremity edema bilaterally Skin: no lesions on visible  extremities Neuro: No focal deficits.  Cranial nerves intact  ASSESSMENT:  1.  GERD without alarm features.  Prior EGD with mild esophagitis and peptic stricture which was dilated.  She has good symptom control on pantoprazole without recurrent dysphagia 2.  Chronic constipation.  Functional 3.  Gas and bloating 4.  Musculoskeletal upper abdominal/rib pain 5.  Diverticulosis without diverticulitis 5.  Most recent colonoscopy 2016 without neoplasia   PLAN:  1.  Reflux precautions 2.  Weight loss 3.  Stop smoking 4.  Resume MiraLAX for constipation.  Decrease dose.  Adjust to achieve desired result 5.  Anti-gas and flatulence dietary sheet 6.  Probiotic align 1 daily for 1 month 7.  Refill pantoprazole 40 mg daily.  Medication risks reviewed 8.  Repeat surveillance colonoscopy around January 2026 9.  Routine office follow-up 1 year A total time of 30 minutes was spent preparing to see the patient, reviewing tests, CT, and laboratories.  Obtaining comprehensive history and performing comprehensive physical examination.  Counseling the patient regarding her multiple above listed issues.  Ordering medications and directing medical therapy.  Finally, documenting clinical information in the health record

## 2020-06-24 NOTE — Patient Instructions (Addendum)
If you are age 61 or older, your body mass index should be between 23-30. Your Body mass index is 32.81 kg/m. If this is out of the aforementioned range listed, please consider follow up with your Primary Care Provider.  If you are age 23 or younger, your body mass index should be between 19-25. Your Body mass index is 32.81 kg/m. If this is out of the aformentioned range listed, please consider follow up with your Primary Care Provider.   We have sent the following medications to your pharmacy for you to pick up at your convenience:  Protonix    Resume your Miralax - adjust as needed  Take Align (over the counter probiotic) daily for one month

## 2020-08-27 ENCOUNTER — Other Ambulatory Visit: Payer: Self-pay | Admitting: Family Medicine

## 2020-08-27 DIAGNOSIS — Z1231 Encounter for screening mammogram for malignant neoplasm of breast: Secondary | ICD-10-CM

## 2020-09-03 ENCOUNTER — Inpatient Hospital Stay: Admission: RE | Admit: 2020-09-03 | Payer: Managed Care, Other (non HMO) | Source: Ambulatory Visit

## 2020-09-07 ENCOUNTER — Ambulatory Visit: Payer: Managed Care, Other (non HMO) | Admitting: Family Medicine

## 2020-09-07 ENCOUNTER — Other Ambulatory Visit: Payer: Self-pay

## 2020-09-07 VITALS — BP 126/74 | HR 78 | Temp 98.1°F | Resp 16 | Ht 68.5 in | Wt 213.0 lb

## 2020-09-07 DIAGNOSIS — R059 Cough, unspecified: Secondary | ICD-10-CM | POA: Diagnosis not present

## 2020-09-07 DIAGNOSIS — J309 Allergic rhinitis, unspecified: Secondary | ICD-10-CM

## 2020-09-07 DIAGNOSIS — R519 Headache, unspecified: Secondary | ICD-10-CM

## 2020-09-07 DIAGNOSIS — R311 Benign essential microscopic hematuria: Secondary | ICD-10-CM | POA: Diagnosis not present

## 2020-09-07 DIAGNOSIS — E785 Hyperlipidemia, unspecified: Secondary | ICD-10-CM | POA: Diagnosis not present

## 2020-09-07 DIAGNOSIS — I10 Essential (primary) hypertension: Secondary | ICD-10-CM

## 2020-09-07 NOTE — Patient Instructions (Addendum)
Restart an acid blocker once per day. Pantoprazole once per day, but let me know or Dr. Blanch Media office if that medicine is too costly.  Align was recommended by Dr. Henrene Pastor. That is over the counter.  Depending on cholesterol results (fasting labs on Monday [facility]), we can discuss if meds needed.   For cough - try flonase daily using the technique we discussed.  If not helping the cough, we may need to change lisinopril.  Also restarting pantoprazole daily may help the cough.  may help. I will also order an xray of your chest (have this done at [facility] when you get your labs).   Please make a list of other concerns for next visit and we can discuss timing and further workup of those concerns.   Return to the clinic or go to the nearest emergency room if any of your symptoms worsen or new symptoms occur.  Cough, Adult Coughing is a reflex that clears your throat and your airways (respiratory system). Coughing helps to heal and protect your lungs. It is normal to cough occasionally, but a cough that happens with other symptoms or lasts a long time may be a sign of a condition that needs treatment. An acute cough may only last2-3 weeks, while a chronic cough may last 8 or more weeks. Coughing is commonly caused by: Infection of the respiratory systemby viruses or bacteria. Breathing in substances that irritate your lungs. Allergies. Asthma. Mucus that runs down the back of your throat (postnasal drip). Smoking. Acid backing up from the stomach into the esophagus (gastroesophageal reflux). Certain medicines. Chronic lung problems. Other medical conditions such as heart failure or a blood clot in the lung (pulmonary embolism). Follow these instructions at home: Medicines Take over-the-counter and prescription medicines only as told by your health care provider. Talk with your health care provider before you take a cough suppressant medicine. Lifestyle  Avoid cigarette smoke. Do not  use any products that contain nicotine or tobacco, such as cigarettes, e-cigarettes, and chewing tobacco. If you need help quitting, ask your health care provider. Drink enough fluid to keep your urine pale yellow. Avoid caffeine. Do not drink alcohol if your health care provider tells you not to drink.  General instructions  Pay close attention to changes in your cough. Tell your health care provider about them. Always cover your mouth when you cough. Avoid things that make you cough, such as perfume, candles, cleaning products, or campfire or tobacco smoke. If the air is dry, use a cool mist vaporizer or humidifier in your bedroom or your home to help loosen secretions. If your cough is worse at night, try to sleep in a semi-upright position. Rest as needed. Keep all follow-up visits as told by your health care provider. This is important.  Contact a health care provider if you: Have new symptoms. Cough up pus. Have a cough that does not get better after 2-3 weeks or gets worse. Cannot control your cough with cough suppressant medicines and you are losing sleep. Have pain that gets worse or pain that is not helped with medicine. Have a fever. Have unexplained weight loss. Have night sweats. Get help right away if: You cough up blood. You have difficulty breathing. Your heartbeat is very fast. These symptoms may represent a serious problem that is an emergency. Do not wait to see if the symptoms will go away. Get medical help right away. Call your local emergency services (911 in the [place] Do not  drive yourself to the hospital. Summary Coughing is a reflex that clears your throat and your airways. It is normal to cough occasionally, but a cough that happens with other symptoms or lasts a long time may be a sign of a condition that needs treatment. Take over-the-counter and prescription medicines only as told by your health care provider. Always cover your mouth when you  cough. Contact a health care provider if you have new symptoms or a cough that does not get better after 2-3 weeks or gets worse. This information is not intended to replace advice given to you by your health care provider. Make sure you discuss any questions you have with your healthcare provider. Document Revised: 02/26/2018 Document Reviewed: 02/26/2018 Elsevier Patient Education  [place].

## 2020-09-07 NOTE — Progress Notes (Signed)
Subjective:  Patient ID: Darlene Gonzalez, female    DOB: 1960-04-21  Age: 60 y.o. MRN: [id number]  CC:  Chief Complaint  Patient presents with   Establish Care    Pt here to establish care today, pt complains of allergy issues worse this year than previous doing okay    Immunizations    Pt due today for a few immunizations and is open to receiving one if advised to do so    HPI Darlene Gonzalez presents for  New patient establish care, previous primary provider Elyn Aquas, PA-C.  Hypertension: Lisinopril 20 mg daily, triamterene HCTZ 37.5/25 mg daily. Home readings: no home readings.  Has not taken meds in past 2 days - has not picked up.  BP Readings from Last 3 Encounters:  09/07/20 126/74  06/24/20 140/78  03/20/20 120/80   Lab Results  Component Value Date   CREATININE 0.76 02/12/2020   Hyperlipidemia: Lipitor 20 mg daily prescribed in past.  Caused cramps in hands. Stopped taking 2 years ago.  Last ate about an hour ago.  Lab Results  Component Value Date   CHOL 175 11/27/2019   HDL 53.70 11/27/2019   LDLCALC 95 11/27/2019   LDLDIRECT 147 (H) 02/01/2014   TRIG 131.0 11/27/2019   CHOLHDL 3 11/27/2019   Lab Results  Component Value Date   ALT 16 02/12/2020   AST 19 02/12/2020   ALKPHOS 107 02/12/2020   BILITOT 0.4 02/12/2020   GERD with history of esophageal stricture: Treated with Protonix 40 mg daily, Endoscopy in 2016, Dr. Henrene Pastor, GERD with mild esophagitis and esophageal stricture status post dilation.  Not taking protonix daily due to cost. Taking about once per week, but out of it now. Cost prohibitive.  Still feels food getting stuck at times. Not every time, some feeling of food stuck at times.  Appt with GI 06/24/20 reviewed. Recommended protonix 40mg  qd - he prescribed meds. She did not pick it up. She asks that I resend it.   Allergic rhinitis: No current meds. Nasal spray difficult.  to use in the past.  Has headaches at times. Eyes watering at  times.  No fever.  Cough fits at times to feeling like she will faint. No true syncope. Past 6 months with cough. More frequent. Tobacco use - 1ppd average for 40 yrs.    Hematuria: Evaluated by Dr. Junious Silk. Per note on 08/03/20 - chronic stable microscopic hematuria and stress incontinence. Recheck in 6 months with PVR. No concern on bladder. Sister with bladder cancer.    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   TOBACCO ABUSE 03/07/2006   Essential hypertension 03/07/2006   OVARIAN CYST 03/07/2006   Past Medical History:  Diagnosis Date   Adenomatous colon polyp    Anemia    Chronic headaches    Diverticulosis    Esophageal stricture    Fluid in knee    Bilateral   GERD (gastroesophageal reflux disease)    Hemorrhagic ovarian cyst 01/2006   Right   Hemorrhoids    Hiatal hernia  HTN (hypertension)    Hyperplastic colon polyp    Hypokalemia 06/2005   2nd to diuretic    Osteoarthritis, knee    Bilateral   Pneumonia    Pyelonephritis 10/6787   Uncomplicated   Recurrent boils    Underarms   Tobacco abuse    UTI (lower urinary tract infection)    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 Authorizing Provider  atorvastatin (LIPITOR) 20 MG tablet  Take 1 tablet (20 mg total) by mouth daily. 03/20/20  Yes Brunetta Jeans, PA-C  lisinopril (ZESTRIL) 20 MG tablet Take 1 tablet (20 mg total) by mouth daily. 03/20/20  Yes Brunetta Jeans, PA-C  pantoprazole (PROTONIX) 40 MG tablet Take 1 tablet by mouth once daily 03/18/19  Yes Brunetta Jeans, PA-C  pantoprazole (PROTONIX) 40 MG tablet Take 1 tablet (40 mg total) by mouth daily. 06/24/20  Yes Irene Shipper, MD  triamterene-hydrochlorothiazide (MAXZIDE-25) 37.5-25 MG tablet Take 1 tablet by mouth daily. 03/20/20  Yes Brunetta Jeans, PA-C  zolpidem (AMBIEN) 10 MG tablet Take 1 tablet (10 mg total) by mouth at bedtime as needed. for sleep 03/20/20  Yes Brunetta Jeans, PA-C   Social History   Socioeconomic History   Marital status: Single    Spouse name: Not on file   Number of children: 0   Years of education: 12th grade   Highest education level: Not on file  Occupational History   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[employer]   Occupation: Deli    Employer: [employer]  Tobacco Use   Smoking status: Every Day    Packs/day: 0.50    Years: 30.00    Pack years: 15.00    Types: Cigarettes   Smokeless tobacco: Never  Vaping Use   Vaping Use: Never used  Substance and Sexual Activity   Alcohol use: Yes    Alcohol/week: 12.0 standard drinks    Types: 12 Cans of beer per week    Comment: 1 twelve pack a weekend   Drug use: No   Sexual activity: Yes    Partners: Male    Birth control/protection: Surgical    Comment: hysterectomy  Other Topics Concern   Not on file  Social History Narrative   Lives by herself, has stable partner   Right handed   Social Determinants of Health   Financial Resource Strain: Not on file  Food Insecurity: Not on file  Transportation Needs: Not on file  Physical Activity: Not on file  Stress: Not on file  Social Connections: Not on file  Intimate Partner Violence: Not on file    Review of Systems Per HPI  Objective:   Vitals:    09/07/20 1504  BP: 126/74  Pulse: 78  Resp: 16  Temp: 98.1 F (36.7 C)  TempSrc: Temporal  Weight: 213 lb (96.6 kg)  Height: 5' 8.5" (1.74 m)     Physical Exam Vitals reviewed.  Constitutional:      Appearance: Normal appearance. She is well-developed.  HENT:     Head: Normocephalic and atraumatic.     Nose:     Comments: Tender to percussion of right greater than left frontal sinus as well as  maxillary sinus bilaterally. Eyes:     Conjunctiva/sclera: Conjunctivae normal.     Pupils: Pupils are equal, round, and reactive to light.  Neck:     Vascular: No carotid bruit.  Cardiovascular:     Rate and Rhythm: Normal rate and regular rhythm.     Heart sounds: Normal heart sounds.  Pulmonary:     Effort: Pulmonary effort is normal.     Breath sounds: Normal breath sounds.  Abdominal:     Palpations: Abdomen is soft. There is no pulsatile mass.     Tenderness: There is no abdominal tenderness.  Musculoskeletal:     Right lower leg: No edema.     Left lower leg: No edema.  Skin:    General: Skin is warm and dry.  Neurological:     Mental Status: She is alert and oriented to person, place, and time.  Psychiatric:        Mood and Affect: Mood normal.        Behavior: Behavior normal.       Assessment & Plan:  MYRA WENG is a 60 y.o. female . Essential hypertension - Plan: Comprehensive metabolic panel  -  Stable, tolerating current regimen. Recent few missed doses, usually med adherent. Labs pending as above.   Hyperlipidemia, unspecified hyperlipidemia type - Plan: Comprehensive metabolic panel, Lipid panel  -No recent statin.  Check baseline labs, plans on fasting lab visit.  Cough - Plan: DG Chest 2 View  -Possibly multifactorial including with allergies, as well as untreated reflux.  Tobacco use history.  Also on ACE inhibitor.  -Discussed proper use of steroid nasal spray, she will try Flonase.  -Restart PPI daily, advised to let me know or her  gastroenterologist if cost prohibitive.  -Check chest x-ray.  -Recheck next few weeks.  ER/RTC precautions  -If no relief with above, consider holding ACE inhibitor temporarily.  Benign microscopic hematuria  -Status post evaluation with urology as above, appears to be benign microscopic hematuria.  Plan for urology follow-up in 6 months  Allergic rhinitis, unspecified seasonality, unspecified trigger  -Trial of Flonase nasal spray  Nonintractable episodic headache, unspecified headache type  -Could be related to sinus issues/allergies.  Trial Flonase initially as above, recheck next visit.  RTC/ER precautions if acute worsening or new symptoms  No orders of the defined types were placed in this encounter.  Patient Instructions  Restart an acid blocker once per day. Pantoprazole once per day, but let me know or Dr. Blanch Media office if that medicine is too costly.  Align was recommended by Dr. Henrene Pastor. That is over the counter.  Depending on cholesterol results (fasting labs on Monday [facility]), we can discuss if meds needed.   For cough - try flonase daily using the technique we discussed.  If not helping the cough, we may need to change lisinopril.  Also restarting pantoprazole daily may help the cough.  may help. I will also order an xray of your chest (have this done at [facility] when you get your labs).   Please make a list of other concerns for next visit and we can discuss timing and further workup of those concerns.   Return to the clinic or go to the nearest emergency room if any of your symptoms worsen or new symptoms occur.  Cough, Adult Coughing is a reflex that clears your throat and your airways (respiratory system). Coughing helps to heal and protect your lungs. It is normal to cough occasionally, but  a cough that happens with other symptoms or lasts a long time may be a sign of a condition that needs treatment. An acute cough may only last2-3 weeks, while a chronic cough  may last 8 or more weeks. Coughing is commonly caused by: Infection of the respiratory systemby viruses or bacteria. Breathing in substances that irritate your lungs. Allergies. Asthma. Mucus that runs down the back of your throat (postnasal drip). Smoking. Acid backing up from the stomach into the esophagus (gastroesophageal reflux). Certain medicines. Chronic lung problems. Other medical conditions such as heart failure or a blood clot in the lung (pulmonary embolism). Follow these instructions at home: Medicines Take over-the-counter and prescription medicines only as told by your health care provider. Talk with your health care provider before you take a cough suppressant medicine. Lifestyle  Avoid cigarette smoke. Do not use any products that contain nicotine or tobacco, such as cigarettes, e-cigarettes, and chewing tobacco. If you need help quitting, ask your health care provider. Drink enough fluid to keep your urine pale yellow. Avoid caffeine. Do not drink alcohol if your health care provider tells you not to drink.  General instructions  Pay close attention to changes in your cough. Tell your health care provider about them. Always cover your mouth when you cough. Avoid things that make you cough, such as perfume, candles, cleaning products, or campfire or tobacco smoke. If the air is dry, use a cool mist vaporizer or humidifier in your bedroom or your home to help loosen secretions. If your cough is worse at night, try to sleep in a semi-upright position. Rest as needed. Keep all follow-up visits as told by your health care provider. This is important.  Contact a health care provider if you: Have new symptoms. Cough up pus. Have a cough that does not get better after 2-3 weeks or gets worse. Cannot control your cough with cough suppressant medicines and you are losing sleep. Have pain that gets worse or pain that is not helped with medicine. Have a fever. Have  unexplained weight loss. Have night sweats. Get help right away if: You cough up blood. You have difficulty breathing. Your heartbeat is very fast. These symptoms may represent a serious problem that is an emergency. Do not wait to see if the symptoms will go away. Get medical help right away. Call your local emergency services (911 in the [place] Do not drive yourself to the hospital. Summary Coughing is a reflex that clears your throat and your airways. It is normal to cough occasionally, but a cough that happens with other symptoms or lasts a long time may be a sign of a condition that needs treatment. Take over-the-counter and prescription medicines only as told by your health care provider. Always cover your mouth when you cough. Contact a health care provider if you have new symptoms or a cough that does not get better after 2-3 weeks or gets worse. This information is not intended to replace advice given to you by your health care provider. Make sure you discuss any questions you have with your healthcare provider. Document Revised: 02/26/2018 Document Reviewed: 02/26/2018 Elsevier Patient Education  2022 [place],   Merri Ray, MD [place] 09/07/20 9:52 PM

## 2020-09-08 ENCOUNTER — Ambulatory Visit
Admission: RE | Admit: 2020-09-08 | Discharge: 2020-09-08 | Disposition: A | Discharge status: Home / Self Care | Payer: Managed Care, Other (non HMO) | Source: Ambulatory Visit | Attending: Family Medicine | Admitting: Family Medicine

## 2020-09-08 DIAGNOSIS — Z1231 Encounter for screening mammogram for malignant neoplasm of breast: Secondary | ICD-10-CM

## 2020-09-09 ENCOUNTER — Other Ambulatory Visit: Payer: Self-pay | Admitting: Family Medicine

## 2020-09-10 NOTE — Telephone Encounter (Signed)
LFD 03/20/20 #30 with 2 refills LOV 09/07/20 NOV 09/21/20

## 2020-09-10 NOTE — Telephone Encounter (Signed)
Controlled substance database (PDMP) reviewed. No concerns appreciated.  Last filled 07/22/20, prior 06/03/20. Can discuss at next visit. Refill ordered.

## 2020-09-11 ENCOUNTER — Other Ambulatory Visit: Payer: Self-pay | Admitting: Family Medicine

## 2020-09-11 ENCOUNTER — Telehealth: Payer: Self-pay | Admitting: Family Medicine

## 2020-09-11 DIAGNOSIS — R928 Other abnormal and inconclusive findings on diagnostic imaging of breast: Secondary | ICD-10-CM

## 2020-09-11 NOTE — Telephone Encounter (Signed)
No impression available from the doctor at this time, will call pt when results are available

## 2020-09-11 NOTE — Telephone Encounter (Signed)
Patient would like to discuss her mammograms results from Monday 09/07/2020.  Please advise.

## 2020-09-14 ENCOUNTER — Ambulatory Visit (INDEPENDENT_AMBULATORY_CARE_PROVIDER_SITE_OTHER)
Admission: RE | Admit: 2020-09-14 | Discharge: 2020-09-14 | Disposition: A | Discharge status: Home / Self Care | Payer: Managed Care, Other (non HMO) | Source: Ambulatory Visit | Attending: Family Medicine | Admitting: Family Medicine

## 2020-09-14 ENCOUNTER — Other Ambulatory Visit: Payer: Self-pay

## 2020-09-14 ENCOUNTER — Other Ambulatory Visit (INDEPENDENT_AMBULATORY_CARE_PROVIDER_SITE_OTHER): Payer: Managed Care, Other (non HMO)

## 2020-09-14 DIAGNOSIS — E785 Hyperlipidemia, unspecified: Secondary | ICD-10-CM | POA: Diagnosis not present

## 2020-09-14 DIAGNOSIS — I10 Essential (primary) hypertension: Secondary | ICD-10-CM

## 2020-09-14 DIAGNOSIS — R059 Cough, unspecified: Secondary | ICD-10-CM | POA: Diagnosis not present

## 2020-09-14 LAB — COMPREHENSIVE METABOLIC PANEL
ALT: 18 U/L (ref 0–35)
AST: 19 U/L (ref 0–37)
Albumin: 4.2 g/dL (ref 3.5–5.2)
Alkaline Phosphatase: 82 U/L (ref 39–117)
BUN: 7 mg/dL (ref 6–23)
CO2: 29 mEq/L (ref 19–32)
Calcium: 8.9 mg/dL (ref 8.4–10.5)
Chloride: 104 mEq/L (ref 96–112)
Creatinine, Ser: 0.81 mg/dL (ref 0.40–1.20)
GFR: 78.62 mL/min (ref >60.00)
Glucose, Bld: 93 mg/dL (ref 70–99)
Potassium: 3.8 mEq/L (ref 3.5–5.1)
Sodium: 142 mEq/L (ref 135–145)
Total Bilirubin: 0.6 mg/dL (ref 0.2–1.2)
Total Protein: 7.3 g/dL (ref 6.0–8.3)

## 2020-09-14 LAB — LIPID PANEL
Cholesterol: 204 mg/dL — ABNORMAL HIGH (ref 0–200)
HDL: 48.2 mg/dL (ref >39.00)
LDL Cholesterol: 128 mg/dL — ABNORMAL HIGH (ref 0–99)
NonHDL: 155.81
Total CHOL/HDL Ratio: 4
Triglycerides: 137 mg/dL (ref 0.0–149.0)
VLDL: 27.4 mg/dL (ref 0.0–40.0)

## 2020-09-21 ENCOUNTER — Ambulatory Visit: Payer: Managed Care, Other (non HMO) | Admitting: Family Medicine

## 2020-09-22 ENCOUNTER — Telehealth: Payer: Self-pay

## 2020-09-22 NOTE — Telephone Encounter (Signed)
Spoke with patient regarding lab results. 

## 2020-09-22 NOTE — Telephone Encounter (Signed)
Returning [facility] call tried to transfer and no answer

## 2020-09-28 ENCOUNTER — Other Ambulatory Visit: Payer: Self-pay

## 2020-09-28 ENCOUNTER — Ambulatory Visit
Admission: RE | Admit: 2020-09-28 | Discharge: 2020-09-28 | Disposition: A | Discharge status: Home / Self Care | Payer: Managed Care, Other (non HMO) | Source: Ambulatory Visit | Attending: Family Medicine | Admitting: Family Medicine

## 2020-09-28 ENCOUNTER — Other Ambulatory Visit: Payer: Self-pay | Admitting: Family Medicine

## 2020-09-28 DIAGNOSIS — R928 Other abnormal and inconclusive findings on diagnostic imaging of breast: Secondary | ICD-10-CM

## 2020-09-28 DIAGNOSIS — N6310 Unspecified lump in the right breast, unspecified quadrant: Secondary | ICD-10-CM

## 2020-10-05 ENCOUNTER — Inpatient Hospital Stay: Payer: Managed Care, Other (non HMO) | Admitting: Family Medicine

## 2020-10-19 ENCOUNTER — Encounter: Payer: Self-pay | Admitting: Family Medicine

## 2020-10-19 ENCOUNTER — Ambulatory Visit: Payer: Managed Care, Other (non HMO) | Admitting: Family Medicine

## 2020-10-19 ENCOUNTER — Other Ambulatory Visit: Payer: Self-pay

## 2020-10-19 VITALS — BP 132/76 | HR 93 | Temp 98.2°F | Resp 17 | Ht 68.5 in | Wt 209.0 lb

## 2020-10-19 DIAGNOSIS — I10 Essential (primary) hypertension: Secondary | ICD-10-CM | POA: Diagnosis not present

## 2020-10-19 DIAGNOSIS — G4700 Insomnia, unspecified: Secondary | ICD-10-CM

## 2020-10-19 DIAGNOSIS — J309 Allergic rhinitis, unspecified: Secondary | ICD-10-CM

## 2020-10-19 DIAGNOSIS — H1031 Unspecified acute conjunctivitis, right eye: Secondary | ICD-10-CM

## 2020-10-19 DIAGNOSIS — R059 Cough, unspecified: Secondary | ICD-10-CM | POA: Diagnosis not present

## 2020-10-19 DIAGNOSIS — K2100 Gastro-esophageal reflux disease with esophagitis, without bleeding: Secondary | ICD-10-CM

## 2020-10-19 MED ORDER — FLUTICASONE PROPIONATE 50 MCG/ACT NA SUSP: 1.0000 | Freq: Every day | NASAL | 6 refills | Status: DC

## 2020-10-19 MED ORDER — LOSARTAN POTASSIUM 50 MG PO TABS: 50.0000 mg | ORAL_TABLET | Freq: Every day | ORAL | 1 refills | Status: DC

## 2020-10-19 MED ORDER — ZOLPIDEM TARTRATE 10 MG PO TABS: 10.0000 mg | ORAL_TABLET | Freq: Every evening | ORAL | 1 refills | Status: DC | PRN

## 2020-10-19 MED ORDER — CIPROFLOXACIN HCL 0.3 % OP SOLN: 1.0000 [drp] | OPHTHALMIC | 0 refills | Status: DC

## 2020-10-19 NOTE — Progress Notes (Signed)
Subjective:  Patient ID: Darlene Gonzalez, female    DOB: 05-30-1960  Age: 60 y.o. MRN: [id number]  CC:  Chief Complaint  Patient presents with   Cough    Pt still has the cough from last ov notes productive and non productive notes some vertigo with coughing now.    Eye Drainage    Pt reports redness, itching, watering eyes, pt reports having to use warm compress to remove crust in the morning,     HPI Darlene Gonzalez presents for   Cough, Eye drainage, itching, watery eyes: Discussed allergies at her last visit in July.  Had difficulty with use of nasal spray in the past, did discuss her cough at that time as well with feeling like she was going to faint without true syncope.  Smoker with 1 pack/day average for 40 years.  History of GERD, treated with Protonix.  Previous mild esophagitis, esophageal stricture status post dilation by GI, Dr. Henrene Pastor.  She was not taking Protonix daily due to cost at last visit, only about once per week.  Some feeling of food getting stuck discussed at July visit.  She did have a visit with GI in May, recommended Protonix 40 mg daily but she had not picked up medication.  Asked that I resend it last visit. We discussed proper use of Flonase nasal spray as a trial, restarted PPI, chest x-ray last visit with no active cardiopulmonary disease.  Continued on ACE inhibitor with option of stopping if no improvement.  Still with cough as above. Cough on occasion throughout the day - some fits cause lightheadededness. No syncope.  Has not used nasal spray - thought was a prescription.  Taking protonix every other day - still tastes food with belching, feeling like food gets stuck. No emesis.  Still smoking same amount - a little over a half pack per day. Not planning on quitting at this time.   R eye discharge: Discharge noted 1 week ago. Yellow and crusting. Persistent d/c , but lessening. R eye blurry 2 days ago - better yesterday. Better today. Back to normal vision  today.  Eye turned red 3 days ago.  No L eye redness/irritation.  Treated by her optho years ago for staph infection in her eye.   Vision Screening   Right eye Left eye Both eyes  Without correction '20/25 20/25 20/20 '$  With correction        Questions about mammogram: Mammogram and ultrasound reviewed from August 8.  Probable benign findings, probable benign mammographic masses which are likely areas of fibrocystic change, 1 of which which was defined sonographically other not convincingly visualized, short-term follow-up recommended with repeat right breast mammography and ultrasound in 6 months.  Discussed with patient, understanding of plan expressed, all questions were answered.  Insomnia:  At end of visit - asked about refills of ambien. Takes '10mg'$  nightly during week, not needed on weekends. Controlled substance database (PDMP) reviewed. No concerns appreciated.  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  TOBACCO ABUSE 03/07/2006   Essential hypertension 03/07/2006   OVARIAN CYST 03/07/2006   Past Medical History:  Diagnosis Date   Adenomatous colon polyp    Anemia    Chronic headaches    Diverticulosis    Esophageal stricture    Fluid in knee    Bilateral   GERD (gastroesophageal reflux disease)    Hemorrhagic ovarian cyst 01/2006   Right   Hemorrhoids    Hiatal hernia    HTN (hypertension)    Hyperplastic colon polyp    Hypokalemia 06/2005   2nd to diuretic     Osteoarthritis, knee    Bilateral   Pneumonia    Pyelonephritis 0000000   Uncomplicated   Recurrent boils    Underarms   Tobacco abuse    UTI (lower urinary tract infection)    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 Authorizing Provider  lisinopril (ZESTRIL) 20 MG tablet Take 1 tablet (20 mg total) by mouth daily. 03/20/20  Yes Brunetta Jeans, PA-C  pantoprazole (PROTONIX) 40 MG tablet Take 1 tablet by mouth once daily 03/18/19  Yes Brunetta Jeans, PA-C  pantoprazole (PROTONIX) 40 MG tablet Take 1 tablet (40 mg total) by mouth daily. 06/24/20  Yes Irene Shipper, MD  triamterene-hydrochlorothiazide (MAXZIDE-25) 37.5-25 MG tablet Take 1 tablet by mouth daily. 03/20/20  Yes Brunetta Jeans, PA-C  zolpidem (AMBIEN) 10 MG tablet TAKE 1 TABLET BY MOUTH AT BEDTIME AS NEEDED FOR SLEEP 09/10/20  Yes Wendie Agreste, MD   Social History   Socioeconomic History   Marital status: Single    Spouse name: Not on file   Number of children: 0   Years of education: 12th grade   Highest education level: Not on file  Occupational History   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[employer]   Occupation: Deli    Employer: [id number]  Tobacco Use   Smoking status: Every Day    Packs/day: 0.50    Years: 30.00    Pack years: 15.00    Types: Cigarettes   Smokeless tobacco: Never  Vaping Use   Vaping Use: Never used  Substance and Sexual Activity   Alcohol use: Yes    Alcohol/week: 12.0 standard drinks    Types: 12 Cans of beer per week    Comment: 1 twelve pack a weekend   Drug use: No   Sexual activity: Yes    Partners: Male    Birth control/protection: Surgical    Comment: hysterectomy  Other Topics Concern   Not on file  Social  History Narrative   Lives by herself, has stable partner   Right handed   Social Determinants of Health   Financial Resource Strain: Not on file  Food Insecurity: Not on file  Transportation Needs: Not on file  Physical Activity: Not on file  Stress: Not on file  Social Connections: Not on file  Intimate Partner Violence: Not on file    Review of Systems  Per HPI.  Objective:   Vitals:   10/19/20 1114  BP: 132/76  Pulse: 93  Resp: 17  Temp: 98.2 F (36.8 C)  TempSrc: Temporal  SpO2: 95%  Weight: 209 lb (94.8 kg)  Height: 5' 8.5" (1.74 m)     Physical Exam Vitals reviewed.  Constitutional:      Appearance: Normal appearance. She is well-developed.  HENT:     Head: Normocephalic and atraumatic.  Eyes:     Pupils: Pupils are equal, round, and reactive to light.     Comments: Lids appear normal bilaterally, right eye with diffuse scleral injection, anterior chamber appears clear.  Normal EOM.  No current discharge at canthi.  Neck:     Vascular: No carotid bruit.  Cardiovascular:     Rate and Rhythm: Normal rate and regular rhythm.     Heart sounds: Normal heart sounds.  Pulmonary:     Effort: Pulmonary effort is normal. No respiratory distress.     Breath sounds: Normal breath sounds. No stridor. No wheezing, rhonchi or rales.  Abdominal:     Palpations: Abdomen is soft. There is no pulsatile mass.     Tenderness: There is no abdominal tenderness.  Musculoskeletal:     Right lower leg: No edema.     Left lower leg: No edema.  Skin:    General: Skin is warm and dry.  Neurological:     Mental Status: She is alert and oriented to person, place, and time.  Psychiatric:        Mood and Affect: Mood normal.        Behavior: Behavior normal.       Assessment & Plan:  Darlene Gonzalez is a 60 y.o. female . Acute conjunctivitis of right eye, unspecified acute conjunctivitis type - Plan: ciprofloxacin (CILOXAN) 0.3 % ophthalmic solution  -Bacterial  conjunctivitis likely by history.  Visual acuity intact.  Start Ciloxan drops, dosage discussed.  After follow-up in the next 2 days if redness is not improving or any worsening symptoms including change in vision.  Understanding expressed.  Cough - Plan: losartan (COZAAR) 50 MG tablet, fluticasone (FLONASE) 50 MCG/ACT nasal spray  -Still likely multifactorial including allergies, reflux, but also possible ACE inhibitor cough.  Changed to losartan.  Start Flonase, correct use discussed previously and verified again today.  Increase proton pump inhibitor to daily use.  Recheck in 6 weeks, sooner if worse  Essential hypertension - Plan: losartan (COZAAR) 50 MG tablet  -Stop lisinopril, start losartan, home monitoring with MyChart update next 10 days.  May need to increase dose.  Allergic rhinitis, unspecified seasonality, unspecified trigger  -As above, start Flonase  Gastroesophageal reflux disease with esophagitis, unspecified whether hemorrhage  -Increase PPI to daily use  Insomnia  Stable with Ambien 5 days/week.  Continue same for now  Counseling NOS, Discussed plan and answered questions on mammogram/ultrasound.   Meds ordered this encounter  Medications   losartan (COZAAR) 50 MG tablet    Sig: Take 1 tablet (50 mg total) by mouth daily.    Dispense:  30 tablet    Refill:  1   fluticasone (FLONASE) 50 MCG/ACT nasal spray    Sig: Place 1-2 sprays into both nostrils daily.    Dispense:  16 g    Refill:  6   ciprofloxacin (CILOXAN) 0.3 % ophthalmic solution    Sig: Place 1 drop into both eyes every 2 (two) hours. Administer 1 drop, every 2 hours, while awake, for 2 days. Then 1 drop, every 4 hours, while awake, for the next 5 days.    Dispense:  5 mL    Refill:  0   Patient Instructions  Increase protonix to daily use. Align is name  of probiotic recommended by your gastroenterologist.  Start flonase nasal spray - 1-2 sprays per nostril for allergy symptoms.  Stop lisinopril  for now - start losartan once per day. Keep a record of your blood pressures outside of the office and send me those on mychart in the next 10 days to make sure we do not need to adjust the losartan.   Start eye drops. If redness not improving in next 2-3 days, or any worsening symptoms be seen right away.   Return to the clinic or go to the nearest emergency room if any of your symptoms worsen or new symptoms occur.  Ambien refilled. Please follow up in next 6 weeks for blood pressure, cough, and to review meds.        Signed,   Merri Ray, MD [place] 10/19/20 12:22 PM

## 2020-10-19 NOTE — Patient Instructions (Addendum)
Increase protonix to daily use. Align is name of probiotic recommended by your gastroenterologist.  Start flonase nasal spray - 1-2 sprays per nostril for allergy symptoms.  Stop lisinopril for now - start losartan once per day. Keep a record of your blood pressures outside of the office and send me those on mychart in the next 10 days to make sure we do not need to adjust the losartan.   Start eye drops. If redness not improving in next 2-3 days, or any worsening symptoms be seen right away.   Return to the clinic or go to the nearest emergency room if any of your symptoms worsen or new symptoms occur.  Ambien refilled. Please follow up in next 6 weeks for blood pressure, cough, and to review meds.

## 2020-12-22 IMAGING — CT CT RENAL STONE PROTOCOL
2 of 4 series · 12 of 46 positions shown, 14 images · non-contrast
Comparison: None.

CLINICAL DATA: Hematuria

EXAM:
CT ABDOMEN AND PELVIS WITHOUT CONTRAST
TECHNIQUE: Multidetector CT imaging of the abdomen and pelvis was performed
following the standard protocol without IV contrast.

[Series 2: renal stone 5.00 br40 s3 axial · axial · 0.64mm/px · z∈[+1227,+1587]mm · 9 of 88 slices shown, 11 images]
[im 8/88  soft-tissue]
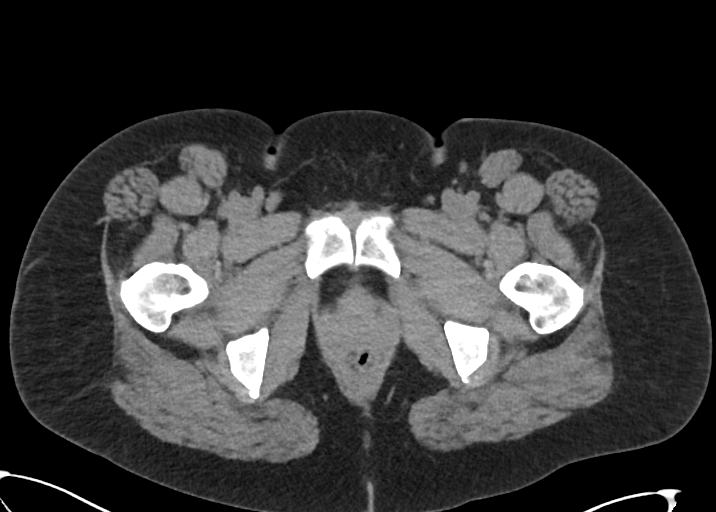
[im 8/88  bone]
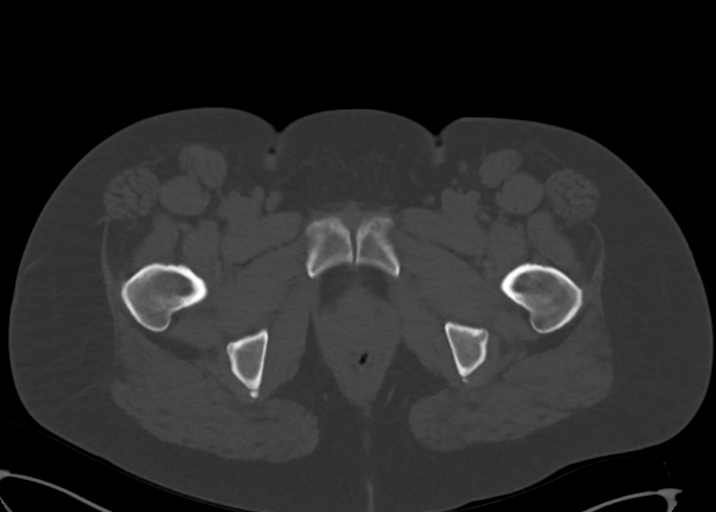
[im 15/88  soft-tissue]
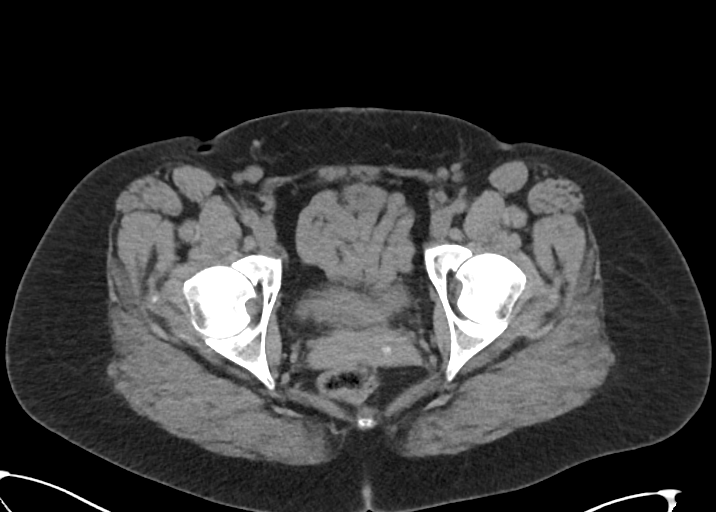
[im 26/88  soft-tissue]
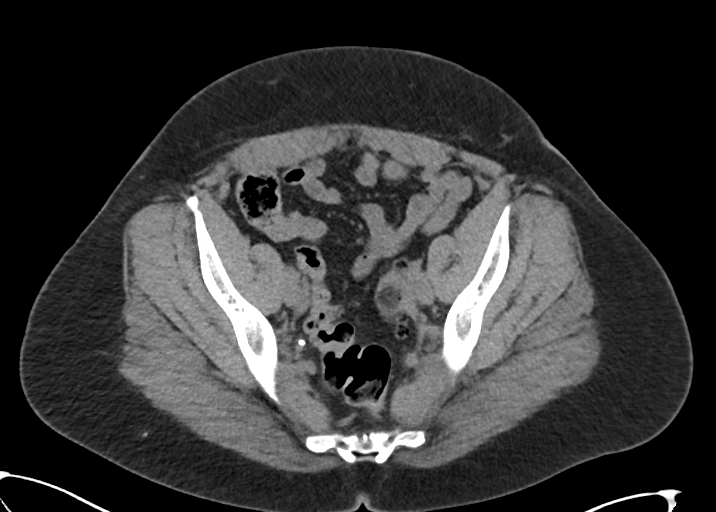
[im 33/88  soft-tissue]
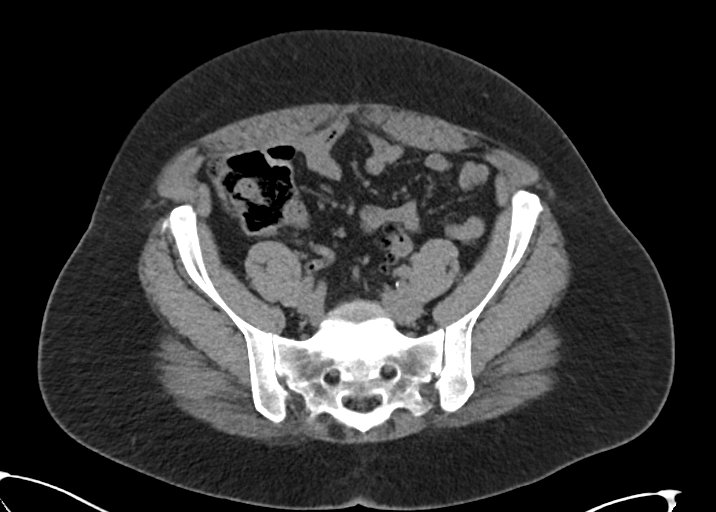
[im 44/88  soft-tissue]
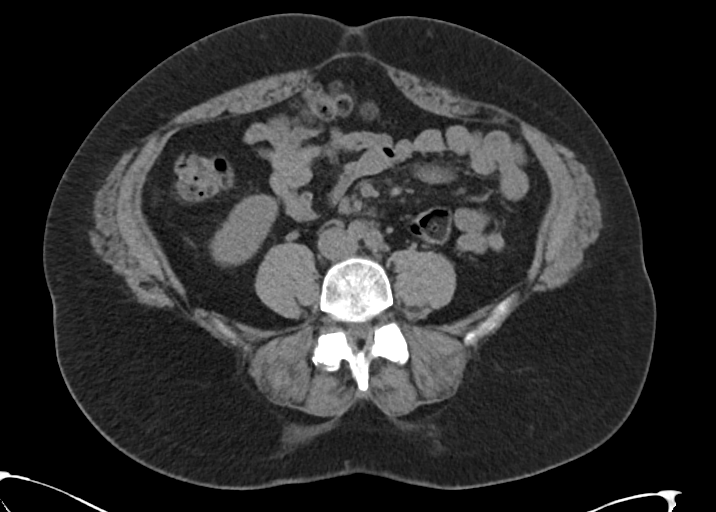
[im 55/88  soft-tissue]
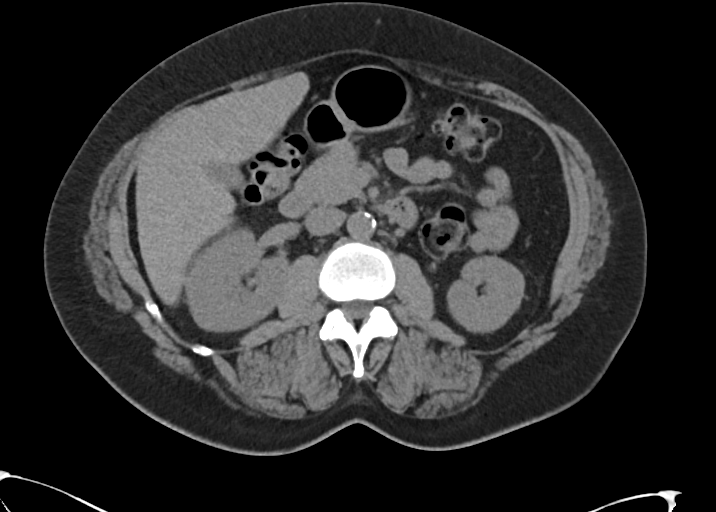
[im 62/88  soft-tissue]
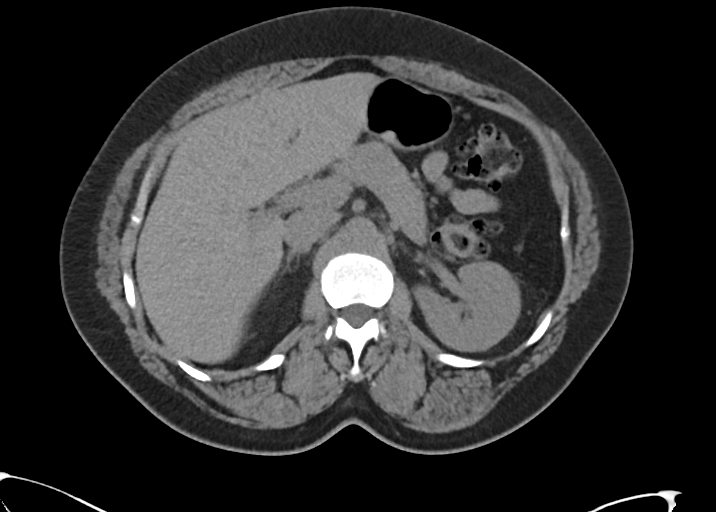
[im 73/88  soft-tissue]
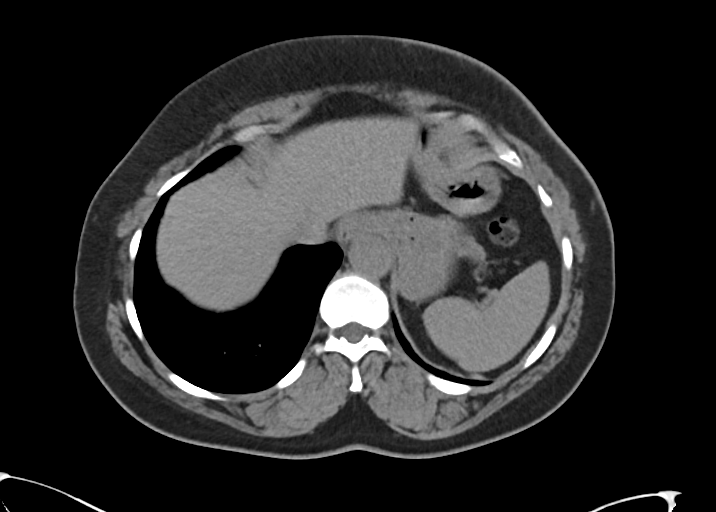
[im 80/88  soft-tissue]
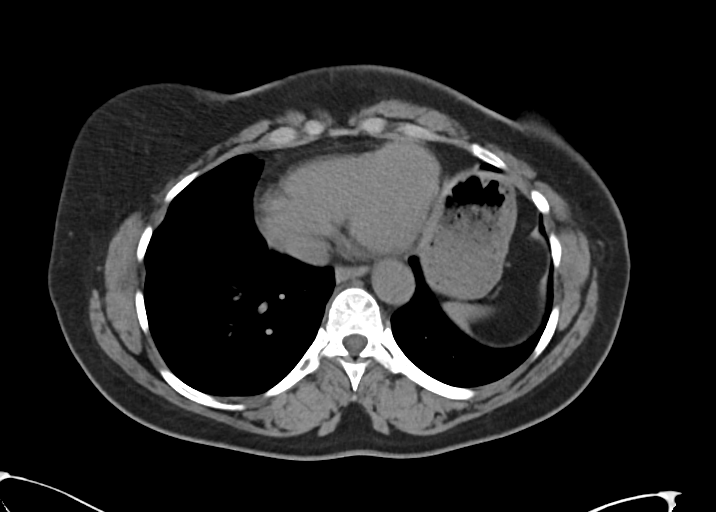
[im 80/88  bone]
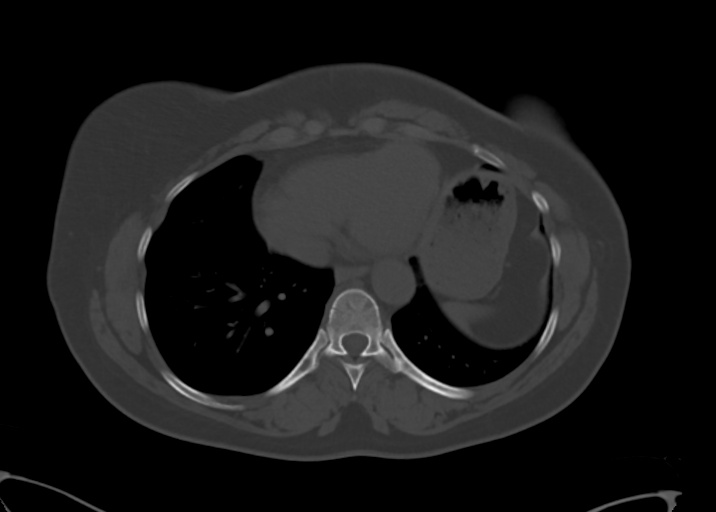

[Series 6: renal stone 2.00 br40 s3 cor · coronal · 0.86mm/px · 3 of 166 slices shown]
[im 56/166  soft-tissue]
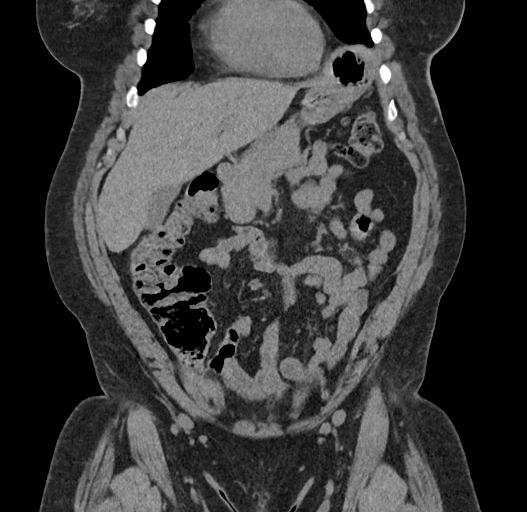
[im 74/166  soft-tissue]
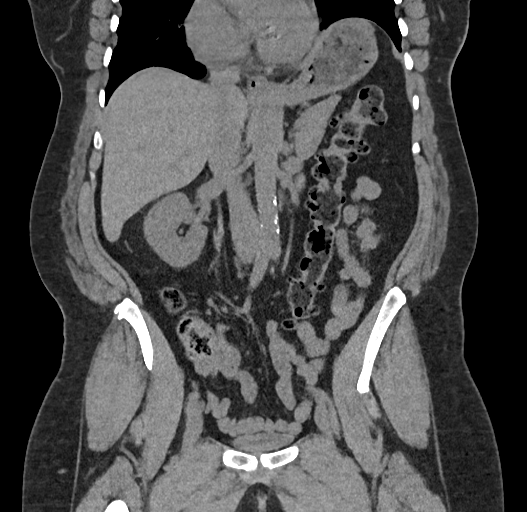
[im 92/166  soft-tissue]
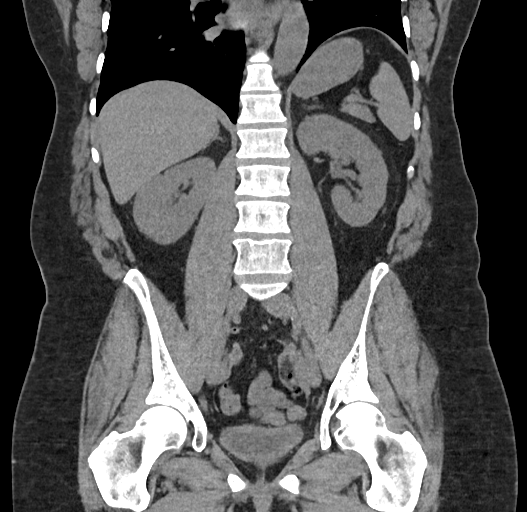

[12 of 46 positions shown; findings below may reference images not displayed]

FINDINGS: Lower chest: No acute pleural or parenchymal lung disease. Prominent
calcifications of the mitral valve.

Hepatobiliary: No focal liver abnormality is seen. No gallstones,
gallbladder wall thickening, or biliary dilatation.

Pancreas: Unremarkable. No pancreatic ductal dilatation or
surrounding inflammatory changes.

Spleen: Normal in size without focal abnormality.

Adrenals/Urinary Tract: No urinary tract calculi or obstructive
uropathy. The adrenals are normal. Bladder is minimally distended
with no focal abnormalities.

Stomach/Bowel: No bowel obstruction or ileus. Diffuse colonic
diverticulosis without diverticulitis. Normal appendix right lower
quadrant. No bowel wall thickening or inflammatory change.

Vascular/Lymphatic: Aortic atherosclerosis. No enlarged abdominal or
pelvic lymph nodes.

Reproductive: Status post hysterectomy. No adnexal masses.

Other: No free fluid or free gas. Fat containing umbilical hernia.
No bowel herniation.

Musculoskeletal: No acute or destructive bony lesions. Reconstructed
images demonstrate prominent facet hypertrophic changes from L3
through S1.
IMPRESSION: 1. No urinary tract calculi or obstructive uropathy to explain the
patient's hematuria. If hematuria remains unexplained after
cystoscopy, or if cystoscopy is not planned, dedicated renal CT or
MRI may be useful.
2. Diffuse colonic diverticulosis without diverticulitis.
3. Fat containing umbilical hernia.
4. Aortic Atherosclerosis (5CK34-203.3).

## 2020-12-23 ENCOUNTER — Ambulatory Visit: Payer: Managed Care, Other (non HMO) | Admitting: Family Medicine

## 2020-12-23 ENCOUNTER — Other Ambulatory Visit: Payer: Self-pay

## 2020-12-23 ENCOUNTER — Telehealth: Payer: Self-pay

## 2020-12-23 ENCOUNTER — Encounter: Payer: Self-pay | Admitting: Nurse Practitioner

## 2020-12-23 ENCOUNTER — Ambulatory Visit: Payer: Managed Care, Other (non HMO) | Admitting: Nurse Practitioner

## 2020-12-23 VITALS — BP 152/86 | HR 81 | Temp 97.8°F | Ht 68.5 in | Wt 229.0 lb

## 2020-12-23 DIAGNOSIS — H05012 Cellulitis of left orbit: Secondary | ICD-10-CM

## 2020-12-23 MED ORDER — CEFTRIAXONE SODIUM 1 G IJ SOLR
1.0000 g | Freq: Once | INTRAMUSCULAR | Status: AC
  Administered 2020-12-23: 1 g via INTRAMUSCULAR

## 2020-12-23 MED ORDER — AMOXICILLIN-POT CLAVULANATE 875-125 MG PO TABS: 1.0000 | ORAL_TABLET | Freq: Two times a day (BID) | ORAL | 0 refills | Status: DC

## 2020-12-23 NOTE — Telephone Encounter (Signed)
Please schedule visit to eval her symptoms in next 2 days. Last visit in August and follow or optho planned at that time if not better in 2-3 days. Thanks.

## 2020-12-23 NOTE — Telephone Encounter (Signed)
Patient is scheduled today at [facility]  at 2:40 with Dr Charmian Muff

## 2020-12-23 NOTE — Patient Instructions (Signed)
Nice to see you today Will have close follow up. If your vision gets worse, you lose vision, or the swelling gets to wear it close the eye or starts swelling in the neck go to the emergency department

## 2020-12-23 NOTE — Telephone Encounter (Signed)
Dr Carlota Raspberry, will patient need a new appt or can a referral be placed for patient to Optometry? Please advise

## 2020-12-23 NOTE — Telephone Encounter (Signed)
Caller name:Amyah Tamala Julian   On DPR? :Yes   Call back number:[phone]  Provider they see: Carlota Raspberry  Reason for call:Pt spoke with Dr Carlota Raspberry last time she was here something was going on with her eye and Dr Carlota Raspberry said if she needed to be seen or referral to optometrist that he could get her in the same day?

## 2020-12-23 NOTE — Telephone Encounter (Signed)
Pt's last appt was 10/19/20, pt would need an OV to re-eval. And no referral was placed or optometry at last appt.

## 2020-12-23 NOTE — Assessment & Plan Note (Signed)
Patient was given Rocephin in office.  Was held 10 to 15 minutes tolerated injection fine will start on Augmentin and have close follow-up.  Patient was given strict signs and symptoms as to when to seek emergency room health care.  She is not having improvement we will send her to ophthalmology urgently.  Patient acknowledged and is on board with plan.  Given no visual disturbances appropriate for outpatient for time being.  Continue to monitor and have close follow-up in office.

## 2020-12-23 NOTE — Progress Notes (Signed)
Acute Office Visit  Subjective:    Patient ID: Darlene Gonzalez, female    DOB: 1960/10/20, 60 y.o.   MRN: [id number]  Chief Complaint  Patient presents with   eye swelling    HPI Patient is in today for Eye swelling  Started Sunday Stayed the same on Monday Got worse Tuesday.  Has been using warm compresses Eye is itching used left over cipro yesterday evey 2 hours without change. Patient denies injury to eye/face or foregin object. Gets white discharge and yellowish green. Hurts and blurred vision  No eye doctor currently. Last time she was evaluated was approx 7 years ago. Unsure of the name of the practice of provider   Past Medical History:  Diagnosis Date   Adenomatous colon polyp    Anemia    Chronic headaches    Diverticulosis    Esophageal stricture    Fluid in knee    Bilateral   GERD (gastroesophageal reflux disease)    Hemorrhagic ovarian cyst 01/2006   Right   Hemorrhoids    Hiatal hernia    HTN (hypertension)    Hyperplastic colon polyp    Hypokalemia 06/2005   2nd to diuretic    Osteoarthritis, knee    Bilateral   Pneumonia    Pyelonephritis 0/1027   Uncomplicated   Recurrent boils    Underarms   Tobacco abuse    UTI (lower urinary tract infection)    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    Past Surgical History:  Procedure Laterality Date   ABDOMINAL HYSTERECTOMY  1998   Dr Jodi Mourning for fibroids (supracervical)   BREAST BIOPSY     BREAST CYST EXCISION Right     Family History  Problem Relation Age of Onset   Hypertension Father        Deceased   Liver disease Father        Cirrhosis   Alcohol abuse Father    Kidney disease Maternal Aunt    Healthy Mother        Living   Thyroid disease Mother    Diabetes Maternal Aunt        x2   Lung cancer Sister    Bladder Cancer Sister     Social History   Socioeconomic History   Marital status: Single    Spouse name: Not on file   Number of children: 0   Years of education: 12th grade    Highest education level: Not on file  Occupational History   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[employer]   Occupation: Deli    Employer: [employer]  Tobacco Use   Smoking status: Every Day    Packs/day: 0.50    Years: 30.00    Pack years: 15.00    Types: Cigarettes   Smokeless tobacco: Never  Vaping Use   Vaping Use: Never used  Substance and Sexual Activity   Alcohol use: Yes    Alcohol/week: 12.0 standard drinks    Types: 12 Cans of beer per week    Comment: 1 twelve pack a weekend   Drug use: No   Sexual activity: Yes    Partners: Male    Birth control/protection: Surgical    Comment: hysterectomy  Other Topics Concern   Not on file  Social History Narrative   Lives by herself, has stable partner   Right handed   Social Determinants of Health   Financial Resource Strain: Not on file  Food Insecurity: Not on file  Transportation  Needs: Not on file  Physical Activity: Not on file  Stress: Not on file  Social Connections: Not on file  Intimate Partner Violence: Not on file    Outpatient Medications Prior to Visit  Medication Sig Dispense Refill   ciprofloxacin (CILOXAN) 0.3 % ophthalmic solution Place 1 drop into both eyes every 2 (two) hours. Administer 1 drop, every 2 hours, while awake, for 2 days. Then 1 drop, every 4 hours, while awake, for the next 5 days. 5 mL 0   fluticasone (FLONASE) 50 MCG/ACT nasal spray Place 1-2 sprays into both nostrils daily. 16 g 6   losartan (COZAAR) 50 MG tablet Take 1 tablet (50 mg total) by mouth daily. 30 tablet 1   pantoprazole (PROTONIX) 40 MG tablet Take 1 tablet by mouth once daily 90 tablet 2   pantoprazole (PROTONIX) 40 MG tablet Take 1 tablet (40 mg total) by mouth daily. 90 tablet 3   triamterene-hydrochlorothiazide (MAXZIDE-25) 37.5-25 MG tablet Take 1 tablet by mouth daily. 90 tablet 1   zolpidem (AMBIEN) 10 MG tablet Take 1 tablet (10 mg total) by mouth at bedtime as needed. for sleep 30 tablet 1   No  facility-administered medications prior to visit.    No Known Allergies  Review of Systems  Constitutional:  Negative for chills and fever.  Eyes:  Positive for photophobia, pain, discharge, redness and itching. Negative for visual disturbance.  Respiratory:  Negative for cough and shortness of breath.   Cardiovascular:  Negative for chest pain.  Gastrointestinal:  Negative for diarrhea, nausea and vomiting.  Skin:  Positive for color change.      Objective:    Physical Exam Vitals and nursing note reviewed.  Constitutional:      Appearance: Normal appearance.  HENT:     Head: Normocephalic.     Right Ear: Tympanic membrane, ear canal and external ear normal. There is no impacted cerumen.     Left Ear: Tympanic membrane, ear canal and external ear normal. There is no impacted cerumen.     Mouth/Throat:     Mouth: Mucous membranes are moist.     Pharynx: Oropharynx is clear.  Eyes:     General:        Left eye: Discharge present.    Extraocular Movements: Extraocular movements intact.     Conjunctiva/sclera:     Left eye: Left conjunctiva is injected. Exudate present.     Pupils: Pupils are equal, round, and reactive to light.     Comments: Erythema and edema left periorbital. TTP. No contacts or glasses vision intact  Neck:   Cardiovascular:     Rate and Rhythm: Normal rate and regular rhythm.  Pulmonary:     Effort: Pulmonary effort is normal.  Lymphadenopathy:     Cervical: Cervical adenopathy present.     Left cervical: Superficial cervical adenopathy present.  Skin:    Findings: Erythema present.  Neurological:     Mental Status: She is alert.   Vision Screening   Right eye Left eye Both eyes  Without correction 20/30 20/25 20/25   With correction        BP (!) 152/86   Pulse 81   Temp 97.8 F (36.6 C) (Temporal)   Ht 5' 8.5" (1.74 m)   Wt 229 lb (103.9 kg)   SpO2 97%   BMI 34.31 kg/m  Wt Readings from Last 3 Encounters:  12/23/20 229 lb (103.9 kg)   10/19/20 209 lb (94.8 kg)  09/07/20 213 lb (96.6  kg)    Health Maintenance Due  Topic Date Due   Pneumococcal Vaccine 65-24 Years old (1 - PCV) Never done   Zoster Vaccines- Shingrix (1 of 2) Never done   TETANUS/TDAP  02/22/2020   COVID-19 Vaccine (2 - Pfizer series) 04/14/2020   INFLUENZA VACCINE  09/21/2020    There are no preventive care reminders to display for this patient.   Lab Results  Component Value Date   TSH 1.38 02/11/2019   Lab Results  Component Value Date   WBC 7.3 02/12/2020   HGB 13.0 02/12/2020   HCT 38.8 02/12/2020   MCV 91.2 02/12/2020   PLT 245.0 02/12/2020   Lab Results  Component Value Date   NA 142 09/14/2020   K 3.8 09/14/2020   CO2 29 09/14/2020   GLUCOSE 93 09/14/2020   BUN 7 09/14/2020   CREATININE 0.81 09/14/2020   BILITOT 0.6 09/14/2020   ALKPHOS 82 09/14/2020   AST 19 09/14/2020   ALT 18 09/14/2020   PROT 7.3 09/14/2020   ALBUMIN 4.2 09/14/2020   CALCIUM 8.9 09/14/2020   GFR 78.62 09/14/2020   Lab Results  Component Value Date   CHOL 204 (H) 09/14/2020   Lab Results  Component Value Date   HDL 48.20 09/14/2020   Lab Results  Component Value Date   LDLCALC 128 (H) 09/14/2020   Lab Results  Component Value Date   TRIG 137.0 09/14/2020   Lab Results  Component Value Date   CHOLHDL 4 09/14/2020   Lab Results  Component Value Date   HGBA1C 5.9 02/11/2019       Assessment & Plan:   Problem List Items Addressed This Visit       Other   Cellulitis of left orbital region - Primary    Patient was given Rocephin in office.  Was held 10 to 15 minutes tolerated injection fine will start on Augmentin and have close follow-up.  Patient was given strict signs and symptoms as to when to seek emergency room health care.  She is not having improvement we will send her to ophthalmology urgently.  Patient acknowledged and is on board with plan.  Given no visual disturbances appropriate for outpatient for time being.  Continue  to monitor and have close follow-up in office.      Relevant Medications   amoxicillin-clavulanate (AUGMENTIN) 875-125 MG tablet     No orders of the defined types were placed in this encounter.  This visit occurred during the SARS-CoV-2 public health emergency.  Safety protocols were in place, including screening questions prior to the visit, additional usage of staff PPE, and extensive cleaning of exam room while observing appropriate contact time as indicated for disinfecting solutions.   Romilda Garret, NP

## 2020-12-25 ENCOUNTER — Other Ambulatory Visit: Payer: Self-pay

## 2020-12-25 ENCOUNTER — Ambulatory Visit: Payer: Managed Care, Other (non HMO) | Admitting: Nurse Practitioner

## 2020-12-25 VITALS — BP 150/90 | HR 79 | Temp 97.2°F | Resp 18 | Ht 68.5 in | Wt 226.1 lb

## 2020-12-25 DIAGNOSIS — H05012 Cellulitis of left orbit: Secondary | ICD-10-CM | POA: Diagnosis not present

## 2020-12-25 DIAGNOSIS — B379 Candidiasis, unspecified: Secondary | ICD-10-CM

## 2020-12-25 DIAGNOSIS — T3695XA Adverse effect of unspecified systemic antibiotic, initial encounter: Secondary | ICD-10-CM | POA: Diagnosis not present

## 2020-12-25 MED ORDER — FLUCONAZOLE 150 MG PO TABS: 150.0000 mg | ORAL_TABLET | Freq: Once | ORAL | 0 refills | Status: AC

## 2020-12-25 NOTE — Assessment & Plan Note (Signed)
Patient states she is prone to get yeast infections after antibiotic use.  Patient will be on Augmentin for 10 days discussed with patient that she is to finish antibiotic course before she takes Diflucan patient knowledge understood Diflucan sent to pharmacy on file.

## 2020-12-25 NOTE — Progress Notes (Signed)
Acute Office Visit  Subjective:    Patient ID: Darlene Gonzalez, female    DOB: [birth year], 60 y.o.   MRN: [id number]  Chief Complaint  Patient presents with   Follow-up    On left eye swelling. Looks and feels better. Patient is still taking antibiotic given    HPI Patient is in today for eye recheck  She has been taking the oral antibiotic for approx 1.5 days.  She has had a great improvement in symptoms Swelling has greatly reduced. Her eye is more open than before. She is having less associated pain.   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    Past Surgical History:  Procedure Laterality Date   ABDOMINAL HYSTERECTOMY  1998   Dr Jodi Mourning for fibroids (supracervical)   BREAST BIOPSY     BREAST CYST EXCISION Right     Family History  Problem Relation Age of Onset   Hypertension Father        Deceased   Liver disease Father        Cirrhosis   Alcohol abuse Father    Kidney disease Maternal Aunt    Healthy Mother        Living   Thyroid disease Mother    Diabetes Maternal Aunt        x2   Lung cancer Sister    Bladder Cancer Sister     Social History   Socioeconomic History   Marital status: Single    Spouse name: Not on file   Number of children: 0   Years of education: 12th grade   Highest education level: Not on file  Occupational History   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[employer]   Occupation:  [employer]    Employer: [employer]  Tobacco Use   Smoking status: Every Day    Packs/day: 0.50    Years: 30.00    Pack years: 15.00    Types: Cigarettes   Smokeless tobacco: Never  Vaping Use   Vaping Use: Never used  Substance and Sexual Activity   Alcohol use: Yes    Alcohol/week: 12.0 standard drinks    Types: 12 Cans of beer per week    Comment: 1 twelve pack a weekend   Drug use: No   Sexual activity: Yes    Partners: Male    Birth control/protection: Surgical    Comment: hysterectomy  Other Topics Concern   Not on file  Social History Narrative   Lives by herself, has stable partner   Right handed   Social Determinants of Health   Financial Resource Strain: Not on file  Food Insecurity: Not on file  Transportation Needs: Not on file  Physical Activity: Not on file  Stress: Not on file  Social Connections:  Not on file  Intimate Partner Violence: Not on file    Outpatient Medications Prior to Visit  Medication Sig Dispense Refill   amoxicillin-clavulanate (AUGMENTIN) 875-125 MG tablet Take 1 tablet by mouth 2 (two) times daily. 20 tablet 0   fluticasone (FLONASE) 50 MCG/ACT nasal spray Place 1-2 sprays into both nostrils daily. 16 g 6   losartan (COZAAR) 50 MG tablet Take 1 tablet (50 mg total) by mouth daily. 30 tablet 1   pantoprazole (PROTONIX) 40 MG tablet Take 1 tablet by mouth once daily 90 tablet 2   triamterene-hydrochlorothiazide (MAXZIDE-25) 37.5-25 MG tablet Take 1 tablet by mouth daily. 90 tablet 1   zolpidem (AMBIEN) 10 MG tablet Take 1 tablet (10 mg total) by mouth at bedtime as needed. for sleep 30 tablet 1   ciprofloxacin (CILOXAN) 0.3 % ophthalmic solution Place 1 drop into both eyes every 2 (two) hours. Administer 1 drop, every 2 hours, while awake, for 2 days. Then 1 drop, every 4 hours, while awake, for the next 5 days. 5 mL 0   pantoprazole (PROTONIX) 40 MG tablet Take 1 tablet (40 mg total) by mouth daily. 90 tablet 3   No facility-administered  medications prior to visit.    No Known Allergies  Review of Systems  Constitutional:  Negative for chills and fever.  HENT:  Negative for trouble swallowing and voice change.   Eyes:  Positive for pain and discharge. Negative for redness and visual disturbance.  Respiratory:  Negative for cough and shortness of breath.   Cardiovascular:  Negative for chest pain.  Gastrointestinal:  Negative for diarrhea, nausea and vomiting.      Objective:    Physical Exam Vitals and nursing note reviewed.  Constitutional:      Appearance: Normal appearance.  HENT:     Right Ear: Tympanic membrane, ear canal and external ear normal. There is no impacted cerumen.     Left Ear: Tympanic membrane, ear canal and external ear normal.     Mouth/Throat:     Mouth: Mucous membranes are moist.     Pharynx: Oropharynx is clear. No posterior oropharyngeal erythema.  Eyes:     Extraocular Movements: Extraocular movements intact.     Right eye: Normal extraocular motion and no nystagmus.     Left eye: Normal extraocular motion and no nystagmus.     Pupils: Pupils are equal, round, and reactive to light.     Comments: Periorbital edema that has improved. Tenderness to periorbital region. Improved since last visit  Cardiovascular:     Rate and Rhythm: Normal rate and regular rhythm.  Pulmonary:     Effort: Pulmonary effort is normal.     Breath sounds: Normal breath sounds.  Lymphadenopathy:     Cervical: Cervical adenopathy present.  Neurological:     Mental Status: She is alert.    BP (!) 150/90   Pulse 79   Temp (!) 97.2 F (36.2 C)   Resp 18   Ht 5' 8.5" (1.74 m)   Wt 226 lb 2 oz (102.6 kg)   SpO2 98%   BMI 33.88 kg/m  Wt Readings from Last 3 Encounters:  12/25/20 226 lb 2 oz (102.6 kg)  12/23/20 229 lb (103.9 kg)  10/19/20 209 lb (94.8 kg)    Health Maintenance Due  Topic Date Due   Pneumococcal Vaccine 73-9 Years old (1 - PCV) Never done   Zoster Vaccines- Shingrix (1 of 2)  Never done   TETANUS/TDAP  02/22/2020  COVID-19 Vaccine (2 - Pfizer series) 04/14/2020   INFLUENZA VACCINE  09/21/2020    There are no preventive care reminders to display for this patient.   Lab Results  Component Value Date   TSH 1.38 02/11/2019   Lab Results  Component Value Date   WBC 7.3 02/12/2020   HGB 13.0 02/12/2020   HCT 38.8 02/12/2020   MCV 91.2 02/12/2020   PLT 245.0 02/12/2020   Lab Results  Component Value Date   NA 142 09/14/2020   K 3.8 09/14/2020   CO2 29 09/14/2020   GLUCOSE 93 09/14/2020   BUN 7 09/14/2020   CREATININE 0.81 09/14/2020   BILITOT 0.6 09/14/2020   ALKPHOS 82 09/14/2020   AST 19 09/14/2020   ALT 18 09/14/2020   PROT 7.3 09/14/2020   ALBUMIN 4.2 09/14/2020   CALCIUM 8.9 09/14/2020   GFR 78.62 09/14/2020   Lab Results  Component Value Date   CHOL 204 (H) 09/14/2020   Lab Results  Component Value Date   HDL 48.20 09/14/2020   Lab Results  Component Value Date   LDLCALC 128 (H) 09/14/2020   Lab Results  Component Value Date   TRIG 137.0 09/14/2020   Lab Results  Component Value Date   CHOLHDL 4 09/14/2020   Lab Results  Component Value Date   HGBA1C 5.9 02/11/2019       Assessment & Plan:   Problem List Items Addressed This Visit       Other   Antibiotic-induced yeast infection - Primary    Patient states she is prone to get yeast infections after antibiotic use.  Patient will be on Augmentin for 10 days discussed with patient that she is to finish antibiotic course before she takes Diflucan patient knowledge understood Diflucan sent to pharmacy on file.      Relevant Medications   fluconazole (DIFLUCAN) 150 MG tablet   Cellulitis of left orbital region    Patient here for recheck with great improvement.  With both symptoms and physical condition.  Patient was instructed to take all of the antibiotic that was prescribed reviewed signs and symptoms as when to seek emergency room health care.  Also refer her to  ophthalmologist that she has been to in the past to complete follow-up.  Patient knowledge understanding.  Continue to monitor. Continue Augmentin as prescribed until finished with prescription      Relevant Orders   Ambulatory referral to Ophthalmology     No orders of the defined types were placed in this encounter.  This visit occurred during the SARS-CoV-2 public health emergency.  Safety protocols were in place, including screening questions prior to the visit, additional usage of staff PPE, and extensive cleaning of exam room while observing appropriate contact time as indicated for disinfecting solutions.    Romilda Garret, NP

## 2020-12-25 NOTE — Assessment & Plan Note (Signed)
Patient here for recheck with great improvement.  With both symptoms and physical condition.  Patient was instructed to take all of the antibiotic that was prescribed reviewed signs and symptoms as when to seek emergency room health care.  Also refer her to ophthalmologist that she has been to in the past to complete follow-up.  Patient knowledge understanding.  Continue to monitor. Continue Augmentin as prescribed until finished with prescription

## 2020-12-25 NOTE — Patient Instructions (Signed)
Nice to see you When you make an appointment tell them Transfer of Care that way we get a longer appointment period [facility] the antibiotics all the way out Let me know if anything changes

## 2021-01-07 ENCOUNTER — Other Ambulatory Visit: Payer: Self-pay | Admitting: Family Medicine

## 2021-01-07 DIAGNOSIS — I10 Essential (primary) hypertension: Secondary | ICD-10-CM

## 2021-01-07 DIAGNOSIS — R059 Cough, unspecified: Secondary | ICD-10-CM

## 2021-01-25 ENCOUNTER — Encounter: Payer: Managed Care, Other (non HMO) | Admitting: Nurse Practitioner

## 2021-01-29 ENCOUNTER — Encounter: Payer: Self-pay | Admitting: Nurse Practitioner

## 2021-01-29 ENCOUNTER — Other Ambulatory Visit: Payer: Self-pay

## 2021-01-29 ENCOUNTER — Ambulatory Visit: Payer: Managed Care, Other (non HMO) | Admitting: Nurse Practitioner

## 2021-01-29 VITALS — BP 142/80 | HR 76 | Temp 97.9°F | Resp 14 | Ht 68.8 in | Wt 226.1 lb

## 2021-01-29 DIAGNOSIS — I10 Essential (primary) hypertension: Secondary | ICD-10-CM

## 2021-01-29 DIAGNOSIS — Z122 Encounter for screening for malignant neoplasm of respiratory organs: Secondary | ICD-10-CM

## 2021-01-29 DIAGNOSIS — R200 Anesthesia of skin: Secondary | ICD-10-CM

## 2021-01-29 DIAGNOSIS — Z833 Family history of diabetes mellitus: Secondary | ICD-10-CM | POA: Diagnosis not present

## 2021-01-29 DIAGNOSIS — G4700 Insomnia, unspecified: Secondary | ICD-10-CM

## 2021-01-29 DIAGNOSIS — R202 Paresthesia of skin: Secondary | ICD-10-CM

## 2021-01-29 DIAGNOSIS — Z0000 Encounter for general adult medical examination without abnormal findings: Secondary | ICD-10-CM | POA: Insufficient documentation

## 2021-01-29 MED ORDER — TRIAMTERENE-HCTZ 37.5-25 MG PO TABS: 1.0000 | ORAL_TABLET | Freq: Every day | ORAL | 1 refills | Status: DC

## 2021-01-29 MED ORDER — ZOLPIDEM TARTRATE 10 MG PO TABS: 10.0000 mg | ORAL_TABLET | Freq: Every evening | ORAL | 0 refills | Status: DC | PRN

## 2021-01-29 MED ORDER — LOSARTAN POTASSIUM 50 MG PO TABS: 50.0000 mg | ORAL_TABLET | Freq: Every day | ORAL | 1 refills | Status: DC

## 2021-01-29 NOTE — Assessment & Plan Note (Signed)
Family history of diabetes mellitus.  Check hemoglobin A1c in office today.  Pending results

## 2021-01-29 NOTE — Assessment & Plan Note (Signed)
Patient is able to check blood pressure at home.  States she recently ran out of losartan 50 mg and started taking lisinopril that was leftover.  States she was switched from lisinopril to losartan because of the dry nagging cough.  Did discuss continuing only 1 medication not both patient knowledge understood.  Continue losartan and Maxide, continue checking blood pressure at home.

## 2021-01-29 NOTE — Assessment & Plan Note (Signed)
Currently maintained on Ambien 10 mg nightly.  Patient does not take it all the time per report.  We will send in refill she can request additional refills before office visits as needed.

## 2021-01-29 NOTE — Assessment & Plan Note (Signed)
Right toes numbness/altered sensation.  Has good cap refill and pulses.  Has been going on approximately 1 year no known injury to back or back surgery.  We will check basic labs pending result refer to neurology.

## 2021-01-29 NOTE — Patient Instructions (Signed)
Nice to see you Will be in touch with blood results Follow up with me in 6 months, sooner if needed

## 2021-01-29 NOTE — Assessment & Plan Note (Signed)
Patient has never had a lung cancer screening before we will refer to low [facility] pulmonology for further work-up

## 2021-01-29 NOTE — Progress Notes (Signed)
Established Patient Office Visit  Subjective:  Patient ID: Darlene Gonzalez, female    DOB: 1960/03/07  Age: 61 y.o. MRN: [id number]  CC:  Chief Complaint  Patient presents with   Transfer of Care    From Dr Carlota Raspberry   Medication Refill    HPI [name] presents for complete physical and follow up of chronic conditions.  Immunizations: -Tetanus: [address] -Influenza: [address] [facility] -Covid-19: Pfizer -Shingles: not yet will get at the phamracy -Pneumonia: update  -HPV: NA   Exercise: No regular exercise.  Pap Smear: GYN needs still have cervix Mammogram: Completed in 09/28/2020 Dexa: Completed in Colonoscopy: Completed in 2016 recall in 2026   Lung Cancer Screening: Due. Has not had one before. Still a current smoker  HTN: Has cuff not sure if accurate. Losartan has ran out and she has taken left over lisinopril. No exercise. maxzide  GERD: Has triggers and eats them anyway. On Protonix  Ortho: [place] gets knee injections Urologist survey for bladder cancer   Numbness/altered sensation: to her right toes. Not sure how to describe it but has been going on for at least a year. No know injury to her back or previous back surgeries.   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    Past Surgical History:  Procedure Laterality Date   ABDOMINAL HYSTERECTOMY  1998   Dr Jodi Mourning for fibroids (supracervical)    BREAST BIOPSY     BREAST CYST EXCISION Right     Family History  Problem Relation Age of Onset   Hypertension Father        Deceased   Liver disease Father        Cirrhosis   Alcohol abuse Father    Kidney disease Maternal Aunt    Healthy Mother        Living   Thyroid disease Mother    Diabetes Maternal Aunt        x2   Lung cancer Sister    Bladder Cancer Sister     Social History   Socioeconomic History   Marital status: Single    Spouse name: Not on file   Number of children: 0   Years of education: 12th grade   Highest education level: Not on file  Occupational History   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[employer]   Occupation: Deli    Employer: [employer]  Tobacco Use   Smoking status: Every Day    Packs/day: 0.50    Years: 30.00    Pack years: 15.00    Types: Cigarettes   Smokeless tobacco: Never  Vaping Use   Vaping Use: Never used  Substance and Sexual Activity   Alcohol use: Yes    Alcohol/week: 12.0 standard drinks  Types: 12 Cans of beer per week    Comment: 1 twelve pack a weekend   Drug use: No   Sexual activity: Yes    Partners: Male    Birth control/protection: Surgical    Comment: hysterectomy  Other Topics Concern   Not on file  Social History Narrative   Lives by herself, has stable partner   Right handed   Social Determinants of Health   Financial Resource Strain: Not on file  Food Insecurity: Not on file  Transportation Needs: Not on file  Physical Activity: Not on file  Stress: Not on file  Social Connections: Not on file  Intimate Partner Violence: Not on file    Outpatient Medications Prior to Visit  Medication Sig Dispense Refill   fluticasone (FLONASE) 50 MCG/ACT nasal spray Place 1-2 sprays into both nostrils daily. 16 g 6   losartan (COZAAR) 50 MG tablet Take 1 tablet by mouth once daily 30 tablet 0   pantoprazole (PROTONIX) 40 MG tablet Take 1 tablet by mouth once daily 90 tablet 2    triamterene-hydrochlorothiazide (MAXZIDE-25) 37.5-25 MG tablet Take 1 tablet by mouth daily. 90 tablet 1   zolpidem (AMBIEN) 10 MG tablet Take 1 tablet (10 mg total) by mouth at bedtime as needed. for sleep 30 tablet 1   amoxicillin-clavulanate (AUGMENTIN) 875-125 MG tablet Take 1 tablet by mouth 2 (two) times daily. 20 tablet 0   No facility-administered medications prior to visit.    Allergies  Allergen Reactions   Lisinopril Cough    Tolerating Losartan fine    ROS Review of Systems  Constitutional:  Negative for chills and fever.  Respiratory:  Negative for cough and shortness of breath.   Cardiovascular:  Negative for chest pain.  Gastrointestinal:  Negative for diarrhea, nausea and vomiting.  Neurological:  Positive for numbness. Negative for weakness.     Objective:    Physical Exam Vitals and nursing note reviewed.  Constitutional:      Appearance: Normal appearance.  HENT:     Right Ear: Tympanic membrane, ear canal and external ear normal. There is no impacted cerumen.     Left Ear: Tympanic membrane, ear canal and external ear normal. There is no impacted cerumen.     Mouth/Throat:     Mouth: Mucous membranes are moist.     Pharynx: Oropharynx is clear.  Cardiovascular:     Rate and Rhythm: Normal rate and regular rhythm.     Pulses: Normal pulses.  Pulmonary:     Effort: Pulmonary effort is normal.     Breath sounds: Normal breath sounds.  Abdominal:     General: Bowel sounds are normal.  Musculoskeletal:     Right lower leg: 1+ Pitting Edema present.     Left lower leg: 1+ Pitting Edema present.  Lymphadenopathy:     Cervical: No cervical adenopathy.  Neurological:     General: No focal deficit present.     Mental Status: She is alert.     Motor: No weakness.     Gait: Gait normal.     Deep Tendon Reflexes: Reflexes normal.     Comments: Bilateral upper and lower extremity strength 5/5  Psychiatric:        Mood and Affect: Mood normal.         Behavior: Behavior normal.        Thought Content: Thought content normal.        Judgment: Judgment normal.    BP (!) 142/80  Pulse 76   Temp 97.9 F (36.6 C)   Resp 14   Ht 5' 8.8" (1.748 m)   Wt 226 lb 2 oz (102.6 kg)   SpO2 100%   BMI 33.59 kg/m  Wt Readings from Last 3 Encounters:  01/29/21 226 lb 2 oz (102.6 kg)  12/25/20 226 lb 2 oz (102.6 kg)  12/23/20 229 lb (103.9 kg)     Health Maintenance Due  Topic Date Due   Pneumococcal Vaccine 31-44 Years old (1 - PCV) Never done   Zoster Vaccines- Shingrix (1 of 2) Never done   TETANUS/TDAP  02/22/2020   COVID-19 Vaccine (2 - Pfizer series) 04/14/2020   INFLUENZA VACCINE  09/21/2020    There are no preventive care reminders to display for this patient.  Lab Results  Component Value Date   TSH 1.38 02/11/2019   Lab Results  Component Value Date   WBC 7.3 02/12/2020   HGB 13.0 02/12/2020   HCT 38.8 02/12/2020   MCV 91.2 02/12/2020   PLT 245.0 02/12/2020   Lab Results  Component Value Date   NA 142 09/14/2020   K 3.8 09/14/2020   CO2 29 09/14/2020   GLUCOSE 93 09/14/2020   BUN 7 09/14/2020   CREATININE 0.81 09/14/2020   BILITOT 0.6 09/14/2020   ALKPHOS 82 09/14/2020   AST 19 09/14/2020   ALT 18 09/14/2020   PROT 7.3 09/14/2020   ALBUMIN 4.2 09/14/2020   CALCIUM 8.9 09/14/2020   GFR 78.62 09/14/2020   Lab Results  Component Value Date   CHOL 204 (H) 09/14/2020   Lab Results  Component Value Date   HDL 48.20 09/14/2020   Lab Results  Component Value Date   LDLCALC 128 (H) 09/14/2020   Lab Results  Component Value Date   TRIG 137.0 09/14/2020   Lab Results  Component Value Date   CHOLHDL 4 09/14/2020   Lab Results  Component Value Date   HGBA1C 5.9 02/11/2019      Assessment & Plan:   Problem List Items Addressed This Visit       Cardiovascular and Mediastinum   Essential hypertension (Chronic)    Patient is able to check blood pressure at home.  States she recently ran out of  losartan 50 mg and started taking lisinopril that was leftover.  States she was switched from lisinopril to losartan because of the dry nagging cough.  Did discuss continuing only 1 medication not both patient knowledge understood.  Continue losartan and Maxide, continue checking blood pressure at home.      Relevant Medications   losartan (COZAAR) 50 MG tablet   triamterene-hydrochlorothiazide (MAXZIDE-25) 37.5-25 MG tablet   Other Relevant Orders   CBC   COMPLETE METABOLIC PANEL WITH GFR   Hemoglobin A1c   Lipid panel     Other   Insomnia    Currently maintained on Ambien 10 mg nightly.  Patient does not take it all the time per report.  We will send in refill she can request additional refills before office visits as needed.      Relevant Medications   zolpidem (AMBIEN) 10 MG tablet   Family history of diabetes mellitus - Primary    Family history of diabetes mellitus.  Check hemoglobin A1c in office today.  Pending results      Numbness and tingling of foot    Right toes numbness/altered sensation.  Has good cap refill and pulses.  Has been going on approximately 1 year no known injury  to back or back surgery.  We will check basic labs pending result refer to neurology.      Relevant Orders   Hemoglobin A1c   Vitamin B12   Folate   Iron, TIBC and Ferritin Panel   Ambulatory referral to Neurology   Screening for lung cancer    Patient has never had a lung cancer screening before we will refer to low [facility] pulmonology for further work-up      Relevant Orders   Ambulatory Referral Lung Cancer Screening [facility]    No orders of the defined types were placed in this encounter.   Follow-up: Return in about 6 months (around 07/30/2021) for recheck.  This visit occurred during the SARS-CoV-2 public health emergency.  Safety protocols were in place, including screening questions prior to the visit, additional usage of staff PPE, and extensive cleaning of exam room  while observing appropriate contact time as indicated for disinfecting solutions.     Romilda Garret, NP

## 2021-01-30 LAB — COMPLETE METABOLIC PANEL WITH GFR
AG Ratio: 1.3 (calc) (ref 1.0–2.5)
ALT: 16 U/L (ref 6–29)
AST: 20 U/L (ref 10–35)
Albumin: 4.3 g/dL (ref 3.6–5.1)
Alkaline phosphatase (APISO): 111 U/L (ref 37–153)
BUN: 11 mg/dL (ref 7–25)
CO2: 30 mmol/L (ref 20–32)
Calcium: 9.7 mg/dL (ref 8.6–10.4)
Chloride: 99 mmol/L (ref 98–110)
Creat: 0.77 mg/dL (ref 0.50–1.05)
Globulin: 3.2 g/dL (calc) (ref 1.9–3.7)
Glucose, Bld: 98 mg/dL (ref 65–99)
Potassium: 4.1 mmol/L (ref 3.5–5.3)
Sodium: 138 mmol/L (ref 135–146)
Total Bilirubin: 0.5 mg/dL (ref 0.2–1.2)
Total Protein: 7.5 g/dL (ref 6.1–8.1)
eGFR: 88 mL/min/{1.73_m2} (ref >=60)

## 2021-01-30 LAB — LIPID PANEL
Cholesterol: 247 mg/dL — ABNORMAL HIGH (ref <200)
HDL: 61 mg/dL (ref >=50)
LDL Cholesterol (Calc): 160 mg/dL (calc) — ABNORMAL HIGH
Non-HDL Cholesterol (Calc): 186 mg/dL (calc) — ABNORMAL HIGH (ref <130)
Total CHOL/HDL Ratio: 4 (calc) (ref <5.0)
Triglycerides: 132 mg/dL (ref <150)

## 2021-01-30 LAB — CBC
HCT: 40.4 % (ref 35.0–45.0)
Hemoglobin: 13.4 g/dL (ref 11.7–15.5)
MCH: 30.7 pg (ref 27.0–33.0)
MCHC: 33.2 g/dL (ref 32.0–36.0)
MCV: 92.4 fL (ref 80.0–100.0)
MPV: 11 fL (ref 7.5–12.5)
Platelets: 257 10*3/uL (ref 140–400)
RBC: 4.37 10*6/uL (ref 3.80–5.10)
RDW: 12.6 % (ref 11.0–15.0)
WBC: 7.5 10*3/uL (ref 3.8–10.8)

## 2021-01-30 LAB — VITAMIN B12: Vitamin B-12: 571 pg/mL (ref 200–1100)

## 2021-01-30 LAB — HEMOGLOBIN A1C
Hgb A1c MFr Bld: 6 % of total Hgb — ABNORMAL HIGH (ref <5.7)
Mean Plasma Glucose: 126 mg/dL
eAG (mmol/L): 7 mmol/L

## 2021-01-30 LAB — IRON,TIBC AND FERRITIN PANEL
%SAT: 23 % (calc) (ref 16–45)
Ferritin: 127 ng/mL (ref 16–288)
Iron: 70 ug/dL (ref 45–160)
TIBC: 301 mcg/dL (calc) (ref 250–450)

## 2021-01-30 LAB — FOLATE: Folate: 17.7 ng/mL

## 2021-02-07 ENCOUNTER — Telehealth: Payer: Self-pay | Admitting: Nurse Practitioner

## 2021-02-07 ENCOUNTER — Other Ambulatory Visit: Payer: Self-pay | Admitting: Nurse Practitioner

## 2021-02-07 DIAGNOSIS — E785 Hyperlipidemia, unspecified: Secondary | ICD-10-CM

## 2021-02-07 DIAGNOSIS — Z5181 Encounter for therapeutic drug level monitoring: Secondary | ICD-10-CM

## 2021-02-07 MED ORDER — ROSUVASTATIN CALCIUM 10 MG PO TABS: 10.0000 mg | ORAL_TABLET | Freq: Every day | ORAL | 0 refills | Status: DC

## 2021-02-07 NOTE — Telephone Encounter (Signed)
Sent in crestor to see if she tolerates this medication better. She will need a lab appointment in one month to make sure her liver is handling the medication ok. Then again in 3 months. I have only but in the first lab draw

## 2021-02-11 NOTE — Telephone Encounter (Signed)
Left message to call back  

## 2021-02-11 NOTE — Telephone Encounter (Signed)
Patient advised and will get labs done at [facility] location

## 2021-02-18 ENCOUNTER — Encounter: Payer: Self-pay | Admitting: Neurology

## 2021-02-18 ENCOUNTER — Other Ambulatory Visit: Payer: Self-pay

## 2021-02-18 DIAGNOSIS — R202 Paresthesia of skin: Secondary | ICD-10-CM

## 2021-03-12 ENCOUNTER — Other Ambulatory Visit: Payer: Self-pay | Admitting: Nurse Practitioner

## 2021-03-12 DIAGNOSIS — G4700 Insomnia, unspecified: Secondary | ICD-10-CM

## 2021-03-30 ENCOUNTER — Encounter: Payer: Managed Care, Other (non HMO) | Admitting: Neurology

## 2021-04-05 ENCOUNTER — Ambulatory Visit
Admission: RE | Admit: 2021-04-05 | Discharge: 2021-04-05 | Disposition: A | Discharge status: Home / Self Care | Payer: Managed Care, Other (non HMO) | Source: Ambulatory Visit | Attending: Family Medicine | Admitting: Family Medicine

## 2021-04-05 ENCOUNTER — Other Ambulatory Visit: Payer: Self-pay | Admitting: Family Medicine

## 2021-04-05 DIAGNOSIS — N6310 Unspecified lump in the right breast, unspecified quadrant: Secondary | ICD-10-CM

## 2021-04-15 ENCOUNTER — Other Ambulatory Visit: Payer: Self-pay

## 2021-04-15 ENCOUNTER — Other Ambulatory Visit: Payer: Self-pay | Admitting: Nurse Practitioner

## 2021-04-15 ENCOUNTER — Ambulatory Visit: Payer: Managed Care, Other (non HMO) | Admitting: Nurse Practitioner

## 2021-04-15 DIAGNOSIS — G4700 Insomnia, unspecified: Secondary | ICD-10-CM

## 2021-04-19 ENCOUNTER — Ambulatory Visit: Payer: Managed Care, Other (non HMO) | Admitting: Family Medicine

## 2021-06-02 ENCOUNTER — Other Ambulatory Visit: Payer: Self-pay | Admitting: Nurse Practitioner

## 2021-06-02 DIAGNOSIS — G4700 Insomnia, unspecified: Secondary | ICD-10-CM

## 2021-07-09 ENCOUNTER — Other Ambulatory Visit: Payer: Self-pay | Admitting: Primary Care

## 2021-07-09 DIAGNOSIS — G4700 Insomnia, unspecified: Secondary | ICD-10-CM

## 2021-08-05 ENCOUNTER — Ambulatory Visit: Payer: Managed Care, Other (non HMO) | Admitting: Nurse Practitioner

## 2021-08-05 ENCOUNTER — Telehealth: Payer: Self-pay | Admitting: Nurse Practitioner

## 2021-08-05 VITALS — BP 134/72 | HR 79 | Temp 97.1°F | Resp 12 | Ht 68.8 in | Wt 243.2 lb

## 2021-08-05 DIAGNOSIS — R7303 Prediabetes: Secondary | ICD-10-CM

## 2021-08-05 DIAGNOSIS — L409 Psoriasis, unspecified: Secondary | ICD-10-CM

## 2021-08-05 DIAGNOSIS — Z6836 Body mass index (BMI) 36.0-36.9, adult: Secondary | ICD-10-CM

## 2021-08-05 DIAGNOSIS — G4700 Insomnia, unspecified: Secondary | ICD-10-CM | POA: Diagnosis not present

## 2021-08-05 DIAGNOSIS — E785 Hyperlipidemia, unspecified: Secondary | ICD-10-CM

## 2021-08-05 DIAGNOSIS — R600 Localized edema: Secondary | ICD-10-CM | POA: Diagnosis not present

## 2021-08-05 LAB — POCT GLYCOSYLATED HEMOGLOBIN (HGB A1C): Hemoglobin A1C: 6.1 % — AB (ref 4.0–5.6)

## 2021-08-05 NOTE — Assessment & Plan Note (Signed)
Patient has gained weight as of late.  She is drinking lots of sodas and having several meals.  Did encourage patient to start by cutting out sodas.  We will check labs today and make sure nothing metabolic going on.  If not patient is to continue work on lifestyle modifications did offer patient to go on a GLP-1 such as Wegovy patient states she is not interested at this current time

## 2021-08-05 NOTE — Assessment & Plan Note (Signed)
Patient currently maintained on zolpidem 10 mg.  States that she will take 1-1/2 sometimes to go to sleep encouraged her not to do that to take just the 110 mg tablet.  She did ask about other stuff out there.  Did tell her about Johnnye Sima which she has tried in the past and did not like.  Is a newer drug but she is not interested in that as has not been that long

## 2021-08-05 NOTE — Assessment & Plan Note (Signed)
Patient is currently on no medications.  She is not fasting today so we will put an order for her to get lipids drawn at a later date.  Patient has gained weight recently encouraged healthy lifestyle modifications

## 2021-08-05 NOTE — Assessment & Plan Note (Signed)
Today's A1c was 6.1% last 1 was 6.0 patient is trending up and has gained weight.  She needs to continue working on lifestyle modifications

## 2021-08-05 NOTE — Telephone Encounter (Signed)
Darlene Gonzalez in office today. I looked at the noted in regards to her LDCT and noticed that it was deferred because of her going through her mammogram. She is currently ready. Are you ok using the old referral or do I need to place a new one?  Thanks, [facility]

## 2021-08-05 NOTE — Assessment & Plan Note (Signed)
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  Patient states she had the diagnosis of psoriasis in the past would like to be evaluated by dermatologist.  States that she has a friend that has a cream that works well but cannot think of the name.  We will have staff reach out to her to see if she can remove the day.  If is appropriate we can prescribe if not we will just do a medium to high potency steroid.  We will have staff go over steroid precautions with patient prior to prescribing

## 2021-08-05 NOTE — Progress Notes (Signed)
New Patient Office Visit  Subjective    Patient ID: Darlene Gonzalez, female    DOB: 1960/07/10  Age: 61 y.o. MRN: [id number]  CC:  Chief Complaint  Patient presents with   Follow-up    6 months    HPI Darlene Gonzalez presents to follow up as an established patient   Weight: eating 6 times a day. Eating fast food. Sodas approx 3 mtn dews a day. Drinking some water. Under a lot of stress currently   Exercise: no exercise at all    Swelling: bilateral lower extremity. TTP. Not unsure if it goes down with elevated  Psoriasis: has place on her left elbow, small of her back and the top of her gluetal crack. States she has had this diagnosis before   Mammogram in 03/2021. She has follow up repeat in 10/06/2021 Colonscopy: 2026 recall.   LDCT order placed need f/u Pap smear was followed by [facility] and has not seen them. Referral placed today     Outpatient Encounter Medications as of 08/05/2021  Medication Sig   fluticasone (FLONASE) 50 MCG/ACT nasal spray Place 1-2 sprays into both nostrils daily.   losartan (COZAAR) 50 MG tablet Take 1 tablet (50 mg total) by mouth daily.   pantoprazole (PROTONIX) 40 MG tablet Take 1 tablet by mouth once daily   triamterene-hydrochlorothiazide (MAXZIDE-25) 37.5-25 MG tablet Take 1 tablet by mouth daily.   zolpidem (AMBIEN) 10 MG tablet TAKE 1 TABLET BY MOUTH AT BEDTIME AS NEEDED FOR SLEEP   [DISCONTINUED] rosuvastatin (CRESTOR) 10 MG tablet Take 1 tablet (10 mg total) by mouth daily.   No facility-administered encounter medications on file as of 08/05/2021.    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    Past Surgical History:  Procedure Laterality Date   ABDOMINAL HYSTERECTOMY  1998   Dr Jodi Mourning for fibroids (supracervical)   BREAST BIOPSY     BREAST CYST EXCISION Right     Family History  Problem Relation Age of Onset   Healthy Mother        Living   Thyroid disease Mother    Hypertension Father        Deceased   Liver disease Father        Cirrhosis   Alcohol abuse Father    Lung cancer Sister    Bladder Cancer Sister    Kidney disease Maternal Aunt    Diabetes Maternal Aunt        x2    Social History   Socioeconomic History   Marital status: Single    Spouse name: Not on file   Number of children: 0   Years of education: 12th grade   Highest education level: Not on file  Occupational History   Occupation:  Stocker    Employer: [employer]   Occupation: Deli    Employer: [id number]  Tobacco Use   Smoking status: Every Day    Packs/day: 0.50    Years: 41.00    Total pack years: 20.50    Types: Cigarettes   Smokeless tobacco: Never  Vaping Use   Vaping Use: Never used  Substance and Sexual Activity   Alcohol use: Yes    Alcohol/week: 12.0 standard drinks of alcohol    Types: 12 Cans of beer per week    Comment: 1 twelve pack a weekend   Drug use: No   Sexual activity: Yes    Partners: Male    Birth control/protection: Surgical    Comment: hysterectomy  Other Topics Concern   Not on file  Social History Narrative   Lives by herself, has stable partner   Right handed   Social Determinants of Health   Financial Resource Strain: Not on file  Food Insecurity: Not on file  Transportation Needs: Not on file  Physical Activity: Not on file  Stress: Not on file  Social Connections: Not on file  Intimate Partner Violence: Not on  file    Review of Systems  Constitutional:  Negative for chills and fever.  Respiratory:  Negative for shortness of breath (DOE with smoking).   Cardiovascular:  Positive for leg swelling. Negative for chest pain.  Gastrointestinal:  Positive for constipation.       BM dark green and fecal incontine  Neurological:  Negative for headaches.        Objective    BP 134/72   Pulse 79   Temp (!) 97.1 F (36.2 C)   Resp 12   Ht 5' 8.8" (1.748 m)   Wt 243 lb 4 oz (110.3 kg)   SpO2 96%   BMI 36.13 kg/m   Physical Exam Vitals and nursing note reviewed.  Constitutional:      Appearance: Normal appearance. She is obese.  HENT:     Right Ear: Tympanic membrane, ear canal and external ear normal.     Left Ear: Tympanic membrane, ear canal and external ear normal.     Mouth/Throat:     Mouth: Mucous membranes are moist.     Pharynx: Oropharynx is clear.  Cardiovascular:     Rate and Rhythm: Normal rate and regular rhythm.     Pulses: Normal pulses.     Heart sounds: Normal heart sounds.  Pulmonary:     Effort: Pulmonary effort is normal.     Breath sounds: Normal breath sounds.  Abdominal:     General: Bowel sounds are normal.  Musculoskeletal:     Right lower leg: 1+ Pitting Edema present.     Left lower leg: 1+ Pitting Edema present.  Lymphadenopathy:     Cervical: No cervical adenopathy.  Skin:    General: Skin is warm.  Neurological:     Mental Status: She is alert.  Psychiatric:        Mood and Affect: Mood normal.        Behavior: Behavior normal.        Thought Content: Thought content normal.        Judgment: Judgment normal.         Assessment & Plan:   Problem List Items Addressed This Visit       Musculoskeletal and Integument   Psoriasis    Psoriatic lesions to left elbow, small of back, superior gluteal crack.  Patient states  she had the diagnosis of psoriasis in the past would like to be evaluated by dermatologist.  States that she has a friend  that has a cream that works well but cannot think of the name.  We will have staff reach out to her to see if she can remove the day.  If is appropriate we can prescribe if not we will just do a medium to high potency steroid.  We will have staff go over steroid precautions with patient prior to prescribing      Relevant Orders   Ambulatory referral to Dermatology     Other   Insomnia    Patient currently maintained on zolpidem 10 mg.  States that she will take 1-1/2 sometimes to go to sleep encouraged her not to do that to take just the 110 mg tablet.  She did ask about other stuff out there.  Did tell her about Johnnye Sima which she has tried in the past and did not like.  Is a newer drug but she is not interested in that as has not been that long      Hyperlipidemia    Patient is currently on no medications.  She is not fasting today so we will put an order for her to get lipids drawn at a later date.  Patient has gained weight recently encouraged healthy lifestyle modifications      Pre-diabetes - Primary    Today's A1c was 6.1% last 1 was 6.0 patient is trending up and has gained weight.  She needs to continue working on lifestyle modifications      Relevant Orders   POCT glycosylated hemoglobin (Hb A1C) (Completed)   CBC   Comprehensive metabolic panel   Bilateral lower extremity edema   Relevant Orders   Brain natriuretic peptide   TSH   Class 2 severe obesity due to excess calories with serious comorbidity and body mass index (BMI) of 36.0 to 36.9 in adult [facility])    Patient has gained weight as of late.  She is drinking lots of sodas and having several meals.  Did encourage patient to start by cutting out sodas.  We will check labs today and make sure nothing metabolic going on.  If not patient is to continue work on lifestyle modifications did offer patient to go on a GLP-1 such as Wegovy patient states she is not interested at this current time      Relevant Orders   Ambulatory  referral to Gynecology   TSH    Return in about 6 months (around 02/04/2022) for recheck .   Romilda Garret, NP

## 2021-08-05 NOTE — Telephone Encounter (Signed)
Darlene Gonzalez asked for a cream. When we talked in office she mentioned a certain cream but could not think of the name. If she wants to get that name to me that is fine and we can see if that is an appropriate cream.  Also with steroid creams with extended use it can cause thinning of the skin and lightening of the skin. Please inform her of that.

## 2021-08-05 NOTE — Patient Instructions (Addendum)
Darlene Gonzalez CNM Was at [facility] but it says it is closed. Will refer to GYN Will refer to Dermatologist also Follow up with me in 6 months, sooner if you need me

## 2021-08-06 LAB — COMPREHENSIVE METABOLIC PANEL
ALT: 17 U/L (ref 0–35)
AST: 18 U/L (ref 0–37)
Albumin: 4 g/dL (ref 3.5–5.2)
Alkaline Phosphatase: 110 U/L (ref 39–117)
BUN: 11 mg/dL (ref 6–23)
CO2: 35 mEq/L — ABNORMAL HIGH (ref 19–32)
Calcium: 9 mg/dL (ref 8.4–10.5)
Chloride: 100 mEq/L (ref 96–112)
Creatinine, Ser: 0.9 mg/dL (ref 0.40–1.20)
GFR: 68.85 mL/min (ref >60.00)
Glucose, Bld: 79 mg/dL (ref 70–99)
Potassium: 3.7 mEq/L (ref 3.5–5.1)
Sodium: 142 mEq/L (ref 135–145)
Total Bilirubin: 0.5 mg/dL (ref 0.2–1.2)
Total Protein: 6.7 g/dL (ref 6.0–8.3)

## 2021-08-06 LAB — CBC
HCT: 37.8 % (ref 36.0–46.0)
Hemoglobin: 12.6 g/dL (ref 12.0–15.0)
MCHC: 33.2 g/dL (ref 30.0–36.0)
MCV: 92 fl (ref 78.0–100.0)
Platelets: 231 10*3/uL (ref 150.0–400.0)
RBC: 4.11 Mil/uL (ref 3.87–5.11)
RDW: 14.6 % (ref 11.5–15.5)
WBC: 7.2 10*3/uL (ref 4.0–10.5)

## 2021-08-06 LAB — TSH: TSH: 4.15 u[IU]/mL (ref 0.35–5.50)

## 2021-08-06 LAB — BRAIN NATRIURETIC PEPTIDE: Pro B Natriuretic peptide (BNP): 83 pg/mL (ref 0.0–100.0)

## 2021-08-06 MED ORDER — TRIAMCINOLONE ACETONIDE 0.1 % EX CREA: 1.0000 | TOPICAL_CREAM | Freq: Two times a day (BID) | CUTANEOUS | 0 refills | Status: DC

## 2021-08-06 NOTE — Telephone Encounter (Signed)
Spoke with patient, she will get the name of the cream and call me back or send mychart message with information

## 2021-08-06 NOTE — Telephone Encounter (Signed)
Left message to call back  

## 2021-08-06 NOTE — Telephone Encounter (Signed)
Patient returned call back to office  Zoryve  0.3%

## 2021-08-06 NOTE — Addendum Note (Signed)
Addended by: Michela Pitcher on: 08/06/2021 04:50 PM   Modules accepted: Orders

## 2021-08-06 NOTE — Telephone Encounter (Signed)
We generally do not prescribe that medication in the primary care setting. I will send in a steroid cream that she can use twice a day for a  week and see if that helps

## 2021-08-09 ENCOUNTER — Other Ambulatory Visit: Payer: Self-pay | Admitting: Nurse Practitioner

## 2021-08-09 DIAGNOSIS — E785 Hyperlipidemia, unspecified: Secondary | ICD-10-CM

## 2021-08-09 DIAGNOSIS — I6523 Occlusion and stenosis of bilateral carotid arteries: Secondary | ICD-10-CM

## 2021-08-09 NOTE — Telephone Encounter (Signed)
Patient advised.

## 2021-08-10 ENCOUNTER — Other Ambulatory Visit: Payer: Self-pay | Admitting: *Deleted

## 2021-08-10 DIAGNOSIS — F17210 Nicotine dependence, cigarettes, uncomplicated: Secondary | ICD-10-CM

## 2021-08-10 DIAGNOSIS — Z87891 Personal history of nicotine dependence: Secondary | ICD-10-CM

## 2021-08-10 DIAGNOSIS — Z122 Encounter for screening for malignant neoplasm of respiratory organs: Secondary | ICD-10-CM

## 2021-08-11 NOTE — Telephone Encounter (Signed)
Patient has been scheduled for sdmv and LDCT

## 2021-08-13 NOTE — Telephone Encounter (Signed)
Patient called stating she has not heard from GYN or Dermatologist office. I called both offices today to verify that they received our referral but both offices are closed today. [facility]- office phone  [phone], [place] [facility] -office phone [phone] I advised patient I was off on Monday and not sure if someone else will be able to follow up on this for her otherwise I would call them Tuesday when I'm back in the office

## 2021-08-17 NOTE — Telephone Encounter (Signed)
Sent referral to Dr Margo Aye, left detailed message for patient with this information

## 2021-08-20 MED ORDER — ZOLPIDEM TARTRATE 10 MG PO TABS: 10.0000 mg | ORAL_TABLET | Freq: Every evening | ORAL | 0 refills | Status: DC | PRN

## 2021-08-20 NOTE — Addendum Note (Signed)
Addended by: Kris Mouton on: 08/20/2021 03:27 PM   Modules accepted: Orders

## 2021-08-20 NOTE — Telephone Encounter (Signed)
Left message asking patient to call me back to see if she was able to be scheduled yet with gyn and dermatology.

## 2021-08-20 NOTE — Telephone Encounter (Addendum)
Patient called back and said that she did get appt scheduled with dermatologist on around 09/07/21. She said she still hasnt heard anything from GYN so she didn't know if we can try somewhere else.   She also requested a refill on her Ambien as well

## 2021-08-20 NOTE — Telephone Encounter (Signed)
Last refill on Ambien was on 07/09/21 #30 with 0 refill LOV 08/05/21 Next appointment on 02/03/22  I sent referral for GYN to [place] office via [place] in epic, they are closed on Friday. Will keep an eye on the referral for the patient.

## 2021-08-25 NOTE — Telephone Encounter (Signed)
Patient is calling back stating she called the office back on Monday, she said that the vm was left that whoever called told her that they were out of the office until next week. Chanika said she will try to call them this week to see if she can get an appointment.

## 2021-08-25 NOTE — Telephone Encounter (Signed)
Center for [facility] at [facility] called patient and left a message to call back to schedule. I called patient and left her detailed message about this and to give them a call back to schedule at Phone: [phone]

## 2021-09-07 ENCOUNTER — Encounter: Payer: Managed Care, Other (non HMO) | Admitting: Acute Care

## 2021-09-08 ENCOUNTER — Telehealth: Payer: Self-pay | Admitting: Nurse Practitioner

## 2021-09-08 ENCOUNTER — Inpatient Hospital Stay: Admission: RE | Admit: 2021-09-08 | Payer: Managed Care, Other (non HMO) | Source: Ambulatory Visit

## 2021-09-09 ENCOUNTER — Telehealth: Payer: Self-pay | Admitting: Acute Care

## 2021-09-09 NOTE — Telephone Encounter (Signed)
Returned call to patient to reschedule sdmv and ldct.  No answer.  Left VM and call back information

## 2021-09-13 NOTE — Telephone Encounter (Signed)
See other telephone note 09/09/2021.

## 2021-09-15 ENCOUNTER — Ambulatory Visit (INDEPENDENT_AMBULATORY_CARE_PROVIDER_SITE_OTHER): Payer: Managed Care, Other (non HMO) | Admitting: Radiology

## 2021-09-15 ENCOUNTER — Encounter: Payer: Self-pay | Admitting: Radiology

## 2021-09-15 VITALS — BP 142/76 | Ht 69.25 in | Wt 246.0 lb

## 2021-09-15 DIAGNOSIS — B372 Candidiasis of skin and nail: Secondary | ICD-10-CM | POA: Diagnosis not present

## 2021-09-15 DIAGNOSIS — R140 Abdominal distension (gaseous): Secondary | ICD-10-CM | POA: Diagnosis not present

## 2021-09-15 DIAGNOSIS — R195 Other fecal abnormalities: Secondary | ICD-10-CM | POA: Diagnosis not present

## 2021-09-15 DIAGNOSIS — Z01419 Encounter for gynecological examination (general) (routine) without abnormal findings: Secondary | ICD-10-CM

## 2021-09-15 LAB — WET PREP FOR TRICH, YEAST, CLUE

## 2021-09-15 MED ORDER — NYSTATIN-TRIAMCINOLONE 100000-0.1 UNIT/GM-% EX OINT: 1.0000 | TOPICAL_OINTMENT | Freq: Two times a day (BID) | CUTANEOUS | 2 refills | Status: DC

## 2021-09-15 NOTE — Addendum Note (Signed)
Addended by: Rubbie Battiest on: 09/15/2021 03:27 PM   Modules accepted: Orders

## 2021-09-15 NOTE — Progress Notes (Signed)
   Darlene Gonzalez [birth year] [id number]   History:  61 y.o. G3P3 presents for annual exam. She complains of vaginal odor, itching, no discharge and rash underneath breasts with sour odor. Over the past 2-3 months she has had abdominal pain, right mid abdomen with severe abdominal distention and green stool. No new medications. No gas.   Gynecologic History Hysterectomy: 1998  Sexually active: no x 1 year  Health Maintenance Last Pap: 2020. Results were: normal Last mammogram: 2022. Results were: normal Last colonoscopy: 2016 Last Dexa: never  Past medical history, past surgical history, family history and social history were all reviewed and documented in the EPIC chart.  ROS:  A ROS was performed and pertinent positives and negatives are included.  Exam:  Vitals:   09/15/21 1423  BP: (!) 142/76  Weight: 246 lb (111.6 kg)  Height: 5' 9.25" (1.759 m)   Body mass index is 36.07 kg/m.  General appearance:  Normal Thyroid:  Symmetrical, normal in size, without palpable masses or nodularity. Respiratory  Auscultation:  Clear without wheezing or rhonchi Cardiovascular  Auscultation:  Regular rate, without rubs, murmurs or gallops  Edema/varicosities:  Not grossly evident Abdominal  Moderate abdominal distention  Liver/spleen:  unable to appreciate due to distention  Hernia:  None appreciated  Skin  Inspection:  Grossly normal with the exception of white plaques under bilateral breasts Breasts: Examined lying and sitting.   Right: Without masses, retractions, nipple discharge or axillary adenopathy.   Left: Without masses, retractions, nipple discharge or axillary adenopathy. Genitourinary   Inguinal/mons: white patches on bilateral groin with erythema at the borders  External genitalia:  Normal appearing vulva with no masses, tenderness, or lesions  BUS/Urethra/Skene's glands:  Normal  Vagina:  Normal appearing with normal color and discharge, no lesions. Atrophy  moderate  Cervix:  absent  Uterus:  absent  Adnexa/parametria:  unable to appreciate due to abdominal distention  Anus and perineum: Normal    Patient informed chaperone available to be present for breast and pelvic exam. Patient has requested no chaperone to be present. Patient has been advised what will be completed during breast and pelvic exam.   Assessment/Plan:   1. Well woman exam with routine gynecological exam Pap 2025  2. Candidal intertrigo Under breasts and inguinal area - nystatin-triamcinolone ointment (MYCOLOG); Apply 1 Application topically 2 (two) times daily.  Dispense: 30 g; Refill: 2  3. Green stool  4. Abdominal distention - Lipase - Amylase - Hepatic function panel - Hepatitis C antibody  - GI follow up, patient at [facility]  Discussed SBE, colonoscopy and DEXA screening as appropriate. Encouraged 125mns/week of cardiovascular and weight bearing exercise minimum. Recommend the use of seatbelts and sunscreen consistently.   Return in 1 year for annual or sooner prn.  Mellissa Conley B WHNP-BC 2:50 PM 09/15/2021

## 2021-09-16 LAB — HEPATIC FUNCTION PANEL
AG Ratio: 1.5 (calc) (ref 1.0–2.5)
ALT: 17 U/L (ref 6–29)
AST: 20 U/L (ref 10–35)
Albumin: 4.1 g/dL (ref 3.6–5.1)
Alkaline phosphatase (APISO): 134 U/L (ref 37–153)
Bilirubin, Direct: 0.1 mg/dL (ref 0.0–0.2)
Globulin: 2.8 g/dL (calc) (ref 1.9–3.7)
Indirect Bilirubin: 0.3 mg/dL (calc) (ref 0.2–1.2)
Total Bilirubin: 0.4 mg/dL (ref 0.2–1.2)
Total Protein: 6.9 g/dL (ref 6.1–8.1)

## 2021-09-16 LAB — LIPASE: Lipase: 36 U/L (ref 7–60)

## 2021-09-16 LAB — HEPATITIS C ANTIBODY: Hepatitis C Ab: NONREACTIVE

## 2021-09-16 LAB — AMYLASE: Amylase: 70 U/L (ref 21–101)

## 2021-09-20 ENCOUNTER — Other Ambulatory Visit (INDEPENDENT_AMBULATORY_CARE_PROVIDER_SITE_OTHER): Payer: Managed Care, Other (non HMO)

## 2021-09-20 DIAGNOSIS — I6523 Occlusion and stenosis of bilateral carotid arteries: Secondary | ICD-10-CM | POA: Diagnosis not present

## 2021-09-20 DIAGNOSIS — E785 Hyperlipidemia, unspecified: Secondary | ICD-10-CM

## 2021-09-21 LAB — LIPID PANEL
Cholesterol: 223 mg/dL — ABNORMAL HIGH (ref 0–200)
HDL: 47.9 mg/dL (ref >39.00)
LDL Cholesterol: 149 mg/dL — ABNORMAL HIGH (ref 0–99)
NonHDL: 175.17
Total CHOL/HDL Ratio: 5
Triglycerides: 130 mg/dL (ref 0.0–149.0)
VLDL: 26 mg/dL (ref 0.0–40.0)

## 2021-09-24 ENCOUNTER — Telehealth: Payer: Self-pay | Admitting: Nurse Practitioner

## 2021-09-24 DIAGNOSIS — E785 Hyperlipidemia, unspecified: Secondary | ICD-10-CM

## 2021-09-24 MED ORDER — SIMVASTATIN 20 MG PO TABS: 20.0000 mg | ORAL_TABLET | Freq: Every day | ORAL | 3 refills | Status: DC

## 2021-09-24 NOTE — Telephone Encounter (Signed)
We can try simvastatin that is less likely to cause the aches. Let me know her thoughts

## 2021-09-24 NOTE — Telephone Encounter (Signed)
-----   Message from Pike sent at 09/23/2021  3:11 PM EDT ----- Patient advised. Patient states rosuvastatin and atorvastatin gave her muscle cramps/pain and joint pain. She would like to try and decrease her risks of cardiovascular events. Not sure if there something else she is able to take or other suggestions in regards to this

## 2021-09-24 NOTE — Telephone Encounter (Signed)
Patient said she is fine with trying simvastatin

## 2021-09-24 NOTE — Telephone Encounter (Signed)
Medication sent to pharmacy  

## 2021-09-24 NOTE — Telephone Encounter (Signed)
Please get details from patient when she calls back as we are playing phone tag. Left message asking patient to call back

## 2021-09-24 NOTE — Addendum Note (Signed)
Addended by: Michela Pitcher on: 09/24/2021 05:07 PM   Modules accepted: Orders

## 2021-09-24 NOTE — Telephone Encounter (Signed)
Left detailed message for the patient, waiting for response

## 2021-09-24 NOTE — Telephone Encounter (Signed)
Patient called back ,would like a call back

## 2021-09-24 NOTE — Telephone Encounter (Signed)
Patient agrees, patient does have an appointment with [place] on 02/03/2022.

## 2021-09-28 ENCOUNTER — Encounter: Payer: Managed Care, Other (non HMO) | Admitting: Acute Care

## 2021-09-28 ENCOUNTER — Telehealth: Payer: Self-pay | Admitting: Acute Care

## 2021-09-28 IMAGING — MG MM DIGITAL DIAGNOSTIC UNILAT*R* W/ TOMO W/ CAD
4 series · 4 of 12 positions shown · non-contrast
Comparison: Previous exam(s).

CLINICAL DATA: Screening recall for possible right breast masses.

EXAM:
DIGITAL DIAGNOSTIC UNILATERAL RIGHT MAMMOGRAM WITH TOMOSYNTHESIS AND
CAD; ULTRASOUND RIGHT BREAST LIMITED
TECHNIQUE: Right digital diagnostic mammography and breast tomosynthesis was
performed. The images were evaluated with computer-aided detection.;
Targeted ultrasound examination of the right breast was performed

[R CC synth-2D]
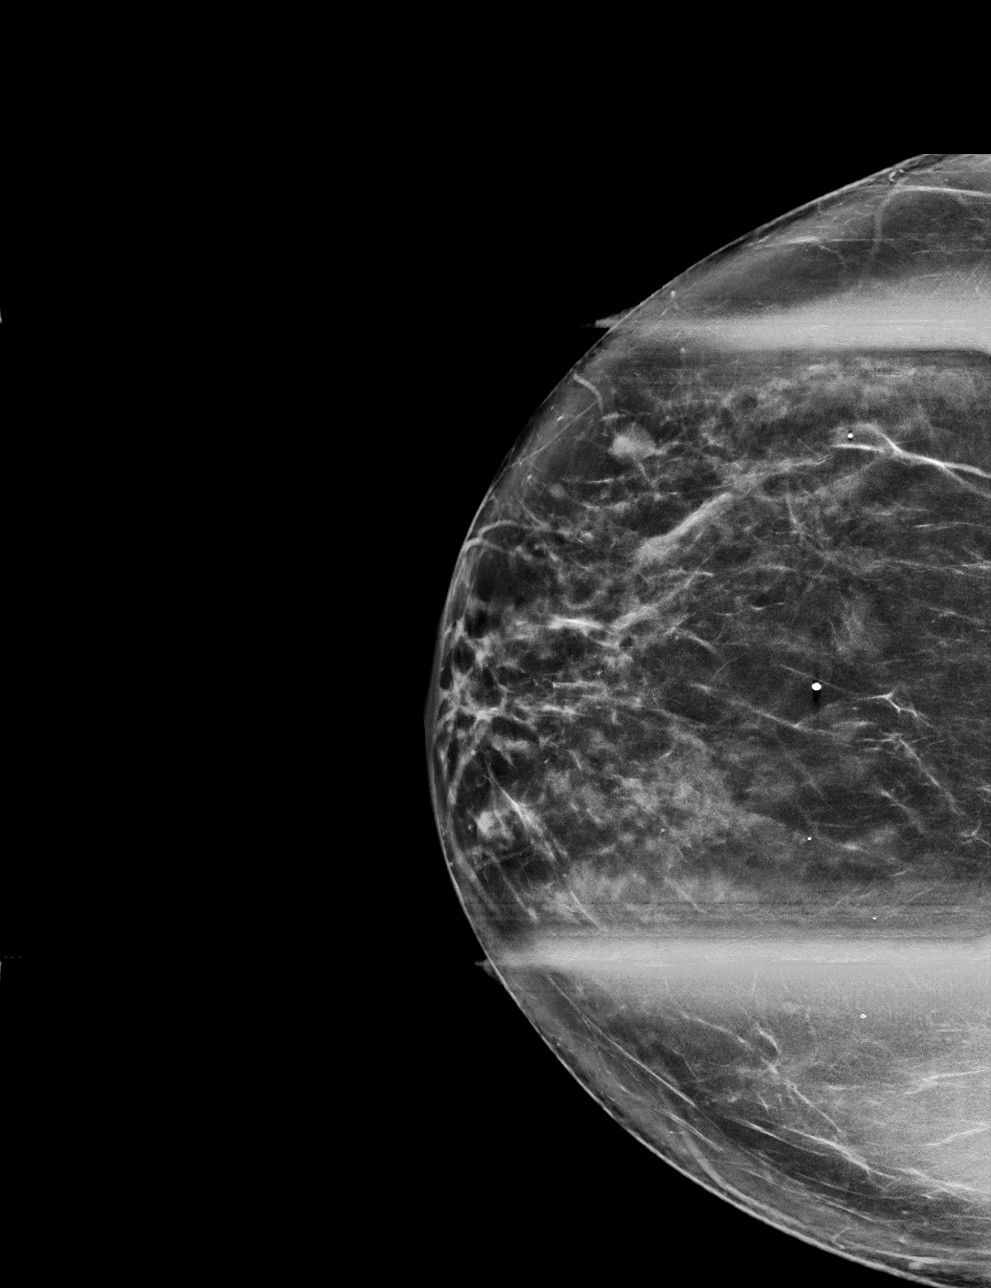

[R MLO synth-2D]
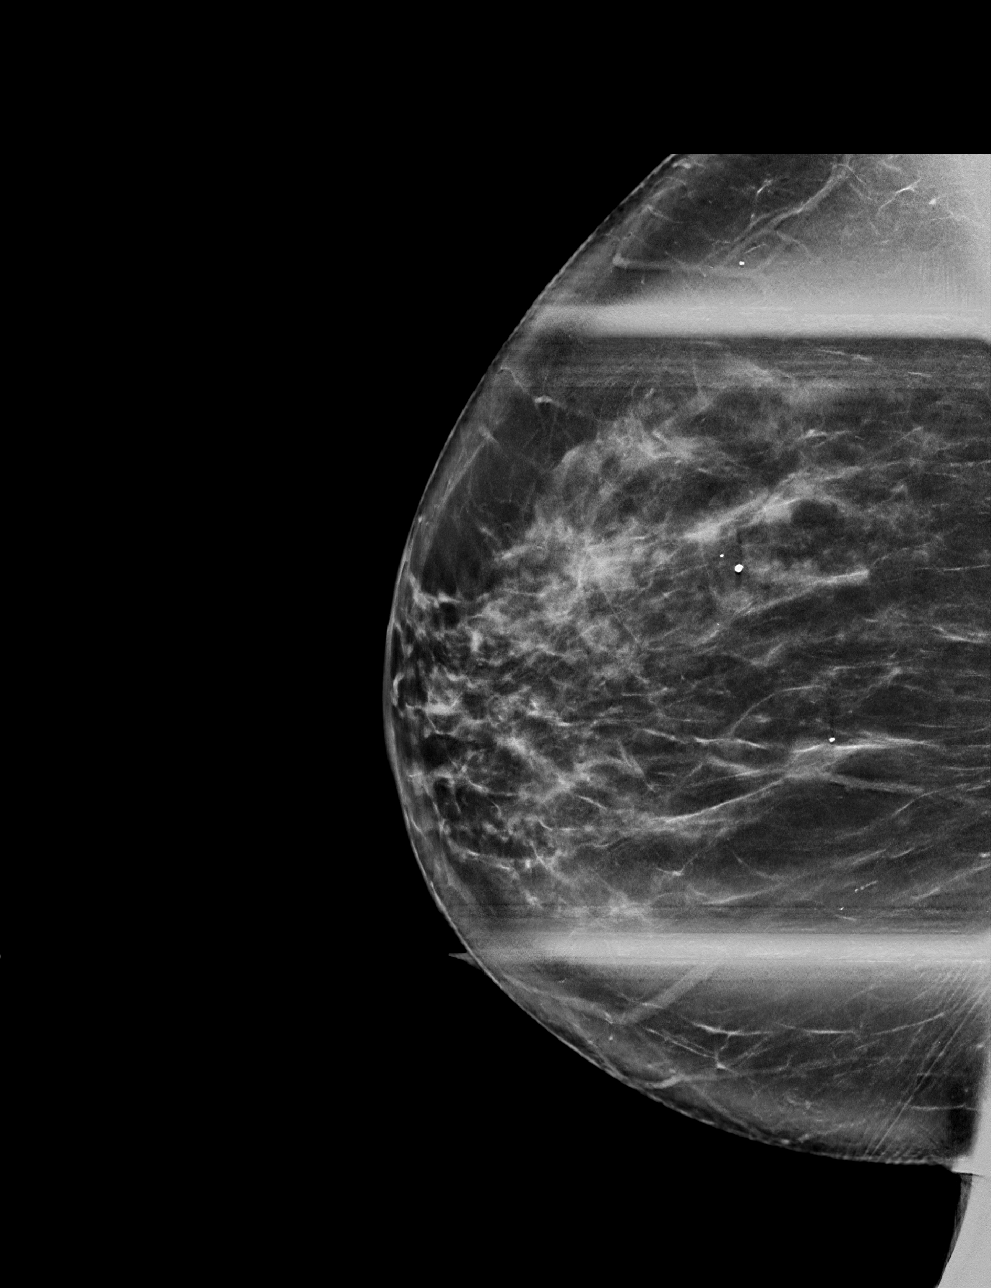

[R MLO tomo · tomo slice 39/77.0]
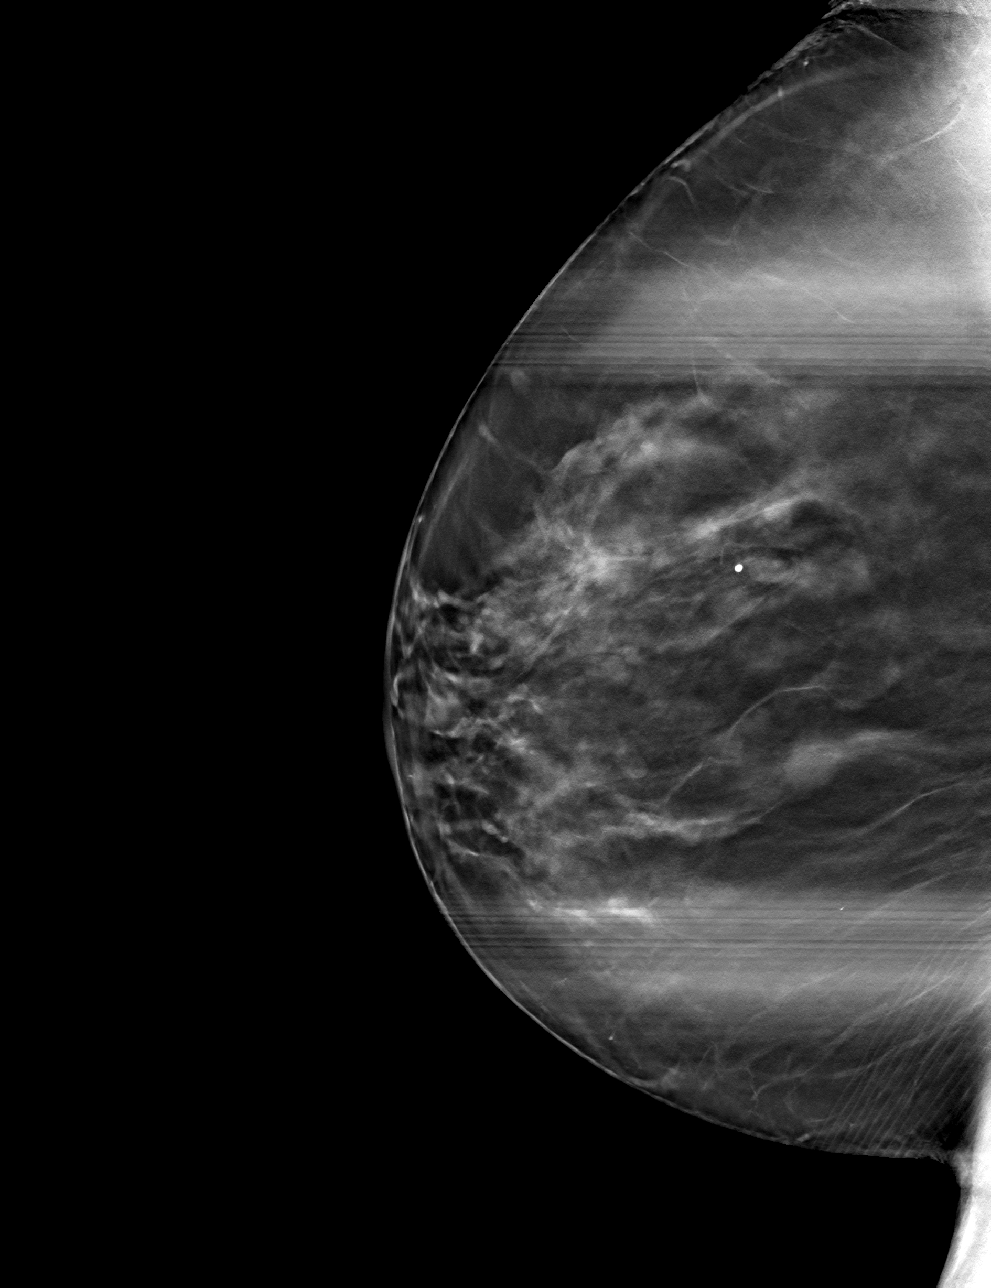

[R CC tomo · tomo slice 35/70.0]
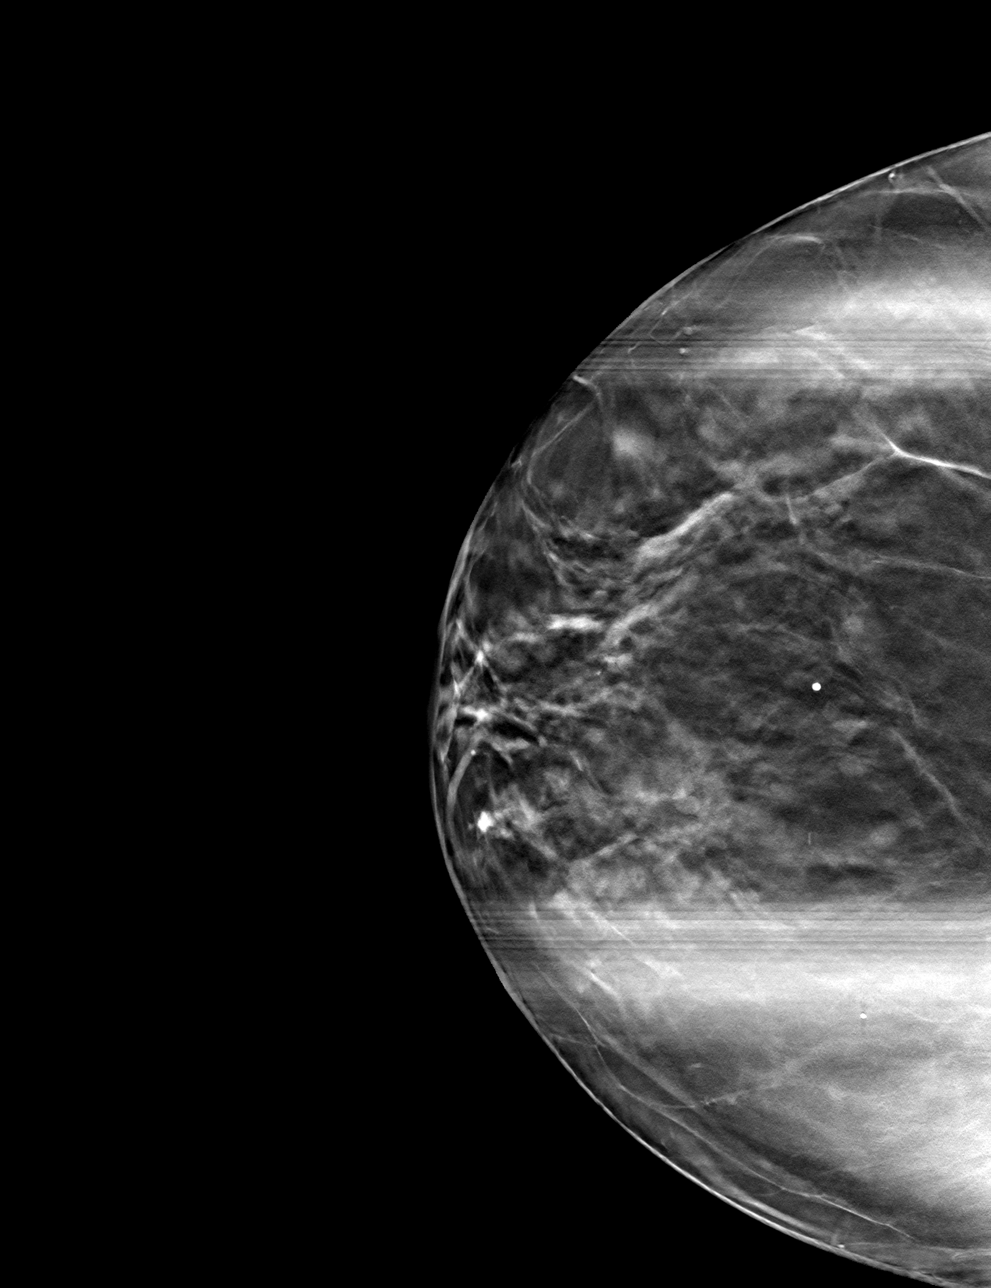

[4 of 12 positions shown; findings below may reference images not displayed]

ACR Breast Density Category b: There are scattered areas of
fibroglandular density.
FINDINGS: The possible masses noted on the current screening study persist on
the diagnostic images. One of these lies more anteriorly, projecting
at 8-9 o'clock, measuring 7 mm in long axis. The other lies more
posteriorly and centrally, still lateral to midline, at
approximately 9 o'clock, measuring 1.1 cm in long axis. Both masses
are similar in appearance, with gently lobulated, smooth margins.
There are several smaller similar appearing masses elsewhere in the
right breast that are stable from prior studies.

On physical exam, no mass is palpated in the lateral right breast.

Targeted ultrasound is performed, showing a hypo to anechoic mass in
the right breast at 8:30 o'clock, 3 cm the nipple, measuring 7 x 5 x
6 mm. Margins are mildly lobulated, partly circumscribed but also
partly ill-defined. There are internal echoes consistent with
intervening septa. A small amount of color Doppler blood flow is
seen within the mass corresponding to the apparent septa. This
appearance is consistent with focal fibrocystic change. The other,
more posterior mass is not convincingly visualized. There is a vague
mixed echogenicity lesion in the right breast at 10 o'clock, 4 cm
the nipple, measuring 9 x 6 x 9 mm, consistent with a second area
fibrocystic change, but not well-defined. A few other small cystic
appearing lesions are seen elsewhere in the lateral right breast.
IMPRESSION: 1. Probably benign findings. Probably benign mammographic masses,
which are likely areas of fibrocystic change, 1 of which is well
defined sonographically, the other not convincingly visualized.
Short-term follow-up is recommended.

RECOMMENDATION:
Diagnostic right breast mammography and ultrasound in 6 months.

I have discussed the findings and recommendations with the patient.
If applicable, a reminder letter will be sent to the patient
regarding the next appointment.

BI-RADS CATEGORY  3: Probably benign.

## 2021-09-28 NOTE — Telephone Encounter (Signed)
I have called the patient 3 times for our scheduled tele visit and there was no answer. I have left 3 messages, and the office contact number. She is scheduled to have her scan 8/9 at 4 pm. We will need to cancel the scan and reschedule the [facility] and scan at a later time.  Langley Gauss, please see if you can reach out to Ms. Mccole and see if we can get them rescheduled. Thanks so much

## 2021-09-29 ENCOUNTER — Inpatient Hospital Stay: Admission: RE | Admit: 2021-09-29 | Payer: Managed Care, Other (non HMO) | Source: Ambulatory Visit

## 2021-09-29 NOTE — Telephone Encounter (Signed)
Patient left a VM on [facility] line this morning to state she didn't receive a reminder call about her sdmv appt nor did she receive any calls for the sdmv and she was upset.  Returned call to patient but call went straight to VM.   Left patient a message to state the appt reminder call also went to VM and the NP called her several times for the sdmv but those calls also went to VM.  Unsure if patient may have a blocker on phone to block unfamiliar calls. Will advise [facility] Director of patient concern.

## 2021-09-29 NOTE — Telephone Encounter (Signed)
LDCT appt cancelled

## 2021-10-06 ENCOUNTER — Ambulatory Visit
Admission: RE | Admit: 2021-10-06 | Discharge: 2021-10-06 | Disposition: A | Discharge status: Home / Self Care | Payer: Managed Care, Other (non HMO) | Source: Ambulatory Visit | Attending: Family Medicine | Admitting: Family Medicine

## 2021-10-06 DIAGNOSIS — N6310 Unspecified lump in the right breast, unspecified quadrant: Secondary | ICD-10-CM

## 2021-10-11 ENCOUNTER — Ambulatory Visit (HOSPITAL_BASED_OUTPATIENT_CLINIC_OR_DEPARTMENT_OTHER): Payer: Managed Care, Other (non HMO) | Admitting: Obstetrics & Gynecology

## 2021-10-14 ENCOUNTER — Ambulatory Visit: Payer: Managed Care, Other (non HMO) | Admitting: Nurse Practitioner

## 2021-10-14 ENCOUNTER — Telehealth: Payer: Self-pay

## 2021-10-14 ENCOUNTER — Encounter: Payer: Self-pay | Admitting: Nurse Practitioner

## 2021-10-14 ENCOUNTER — Telehealth: Payer: Self-pay | Admitting: Nurse Practitioner

## 2021-10-14 VITALS — BP 132/78 | HR 88 | Ht 69.25 in | Wt 245.0 lb

## 2021-10-14 DIAGNOSIS — R194 Change in bowel habit: Secondary | ICD-10-CM | POA: Diagnosis not present

## 2021-10-14 NOTE — Patient Instructions (Addendum)
   Call my nurse Mickel Baas in 7-10 days to let us know if fecal incontinence has improved.   Your provider recommends that you complete a bowel purge (to clean out your bowels). Please do the following: Purchase a bottle of Miralax over the counter as well as a box of 5 mg dulcolax tablets. Take 4 dulcolax tablets. Wait 1 hour. You will then drink 6-8 capfuls of Miralax mixed in 32 ounces of water/juice/gatorade (you may choose which of these liquids to drink) over the next 2-3 hours. You should expect results within 1 to 6 hours after completing the bowel purge.  ______________________________________________________ If you are age 6 or younger, your body mass index should be between 19-25. Your Body mass index is 35.92 kg/m. If this is out of the aformentioned range listed, please consider follow up with your Primary Care Provider.   ________________________________________________________  The [facility] providers would like to encourage you to use [facility] to communicate with providers for non-urgent requests or questions.  Due to long hold times on the telephone, sending your provider a message by [facility] may be a faster and more efficient way to get a response.  Please allow 48 business hours for a response.  Please remember that this is for non-urgent requests.  _______________________________________________________  Due to recent changes in healthcare laws, you may see the results of your imaging and laboratory studies on MyChart before your provider has had a chance to review them.  We understand that in some cases there may be results that are confusing or concerning to you. Not all laboratory results come back in the same time frame and the provider may be waiting for multiple results in order to interpret others.  Please give [place] 48 hours in order for your provider to thoroughly review all the results before contacting the office for clarification of your results.    Thank you for  choosing me and [facility].  Willia Craze, NP

## 2021-10-14 NOTE — Progress Notes (Signed)
Chief Complaint:  bowel changes   Assessment &  Plan   #Bowel changes.  Having increased frequency of formed green stool with sensation of incomplete evacuation and some fecal leakage at times.  She actually has a history of chronic constipation so I wonder if the leakage may be some overflow leakage.  TSH normal in June Will try bowel purge.  She will call with a condition update in 7 to 10 days.  Not clear why her stools are green but not overly concerning at this point.  Suspect it is something that she eating.   # Protuberant abdomen. This is one of the reasons she is here today.  No obstructive symptoms.  Ascites always possible but based on exam it seems unlikely.  She may have just gained weight in her abdomen.  Her weight is up 19 pounds over the last 6 months.  Purge bowels Depending on clinical course consider abdominal ultrasound just to make sure she has no ascites  # Colon cancer screening. Due for 10 year colonoscopy around January 2026   HPI   Darlene Gonzalez is a 61 y.o. female known to Dr.  Henrene Pastor with a past medical history significant for diverticulosis, possible remote history of adenomatous colon polyps, esophageal stricture / GERD with mild esophagitis, hysterectomy.  See PMH /PSH for additional history   Patient last seen by Dr. Henrene Pastor May 2022 for evaluation of constipation, bloating / gas and ongoing abdominal pain under ribcage bilaterally.  She was continued on PPI. Abdominal pain felt to be musculoskeletal and constipation functional. Given anti-gas and flatulence diet and started on Align.   08/05/21 CBC normal  09/15/21 labs Lipase normal LFTs normal.   Interval History:   Ms. Beaudry is here for couple reasons.  First her gynecologist was concerned about abdominal distention.  Second Ms. Jamerson is concerned because her stools have been green and more frequent. She is having some fecal leakage.  She used to have 3-4 bowel movements a week but now having 1-2  bowel movements every day.  She does not take iron.  No supplement use.  No recent medication changes.  TSH was normal in June.   She tells that her abdominal girth has really been about the same over the last several months.  She does not feel bloated.  No nausea or vomiting.  Over the last 6 months her weight has increased from 226 pounds to 245 pounds.    Previous GI Evaluation   Most recent Endoscopies:   Jan 2016 EGD for dysphagia and reflux  1. GERD with mild esophagitis 2. Esophageal stricture status post dilation  January 2016 polyp surveillance colonoscopy -Diverticulosis throughout the whole colon   Labs:     Latest Ref Rng & Units 08/05/2021    3:43 PM 01/29/2021    3:32 PM 02/12/2020    4:35 PM  CBC  WBC 4.0 - 10.5 K/uL 7.2  7.5  7.3   Hemoglobin 12.0 - 15.0 g/dL 12.6  13.4  13.0   Hematocrit 36.0 - 46.0 % 37.8  40.4  38.8   Platelets 150.0 - 400.0 K/uL 231.0  257  245.0        Latest Ref Rng & Units 09/15/2021    2:49 PM 08/05/2021    3:43 PM 01/29/2021    3:32 PM  Hepatic Function  Total Protein 6.1 - 8.1 g/dL 6.9  6.7  7.5   Albumin 3.5 - 5.2 g/dL  4.0  AST 10 - 35 U/L 20  18  20    ALT 6 - 29 U/L 17  17  16    Alk Phosphatase 39 - 117 U/L  110    Total Bilirubin 0.2 - 1.2 mg/dL 0.4  0.5  0.5   Bilirubin, Direct 0.0 - 0.2 mg/dL 0.1        Past Medical History:  Diagnosis Date   Adenomatous colon polyp    Anemia    Chronic headaches    Diverticulosis    Esophageal stricture    Fluid in knee    Bilateral   GERD (gastroesophageal reflux disease)    Hemorrhagic ovarian cyst 01/2006   Right   Hemorrhoids    Hiatal hernia    HTN (hypertension)    Hyperplastic colon polyp    Hypokalemia 06/2005   2nd to diuretic    Osteoarthritis, knee    Bilateral   Pneumonia    Pyelonephritis 09/1592   Uncomplicated   Recurrent boils    Underarms   Tobacco abuse    UTI (lower urinary tract infection)    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     Current Outpatient Medications  Medication Sig Dispense Refill   fluticasone (FLONASE) 50 MCG/ACT nasal spray Place 1-2 sprays into both nostrils daily. 16 g 6   losartan (COZAAR) 50 MG tablet Take 1 tablet (50 mg total) by mouth daily. 90 tablet 1   nystatin-triamcinolone ointment (MYCOLOG) Apply 1 Application topically 2 (two) times daily. 30 g 2   pantoprazole (PROTONIX) 40 MG tablet Take 1 tablet by mouth once daily 90 tablet 2   simvastatin (ZOCOR) 20 MG tablet Take 1 tablet (20 mg total) by mouth at bedtime. 90 tablet 3   triamcinolone cream (KENALOG) 0.1 % Apply 1 Application topically 2 (two) times daily. For one week (Patient not taking: Reported on 09/15/2021) 30 g 0   triamterene-hydrochlorothiazide (MAXZIDE-25) 37.5-25 MG tablet Take 1 tablet by mouth daily. 90 tablet 1   zolpidem (AMBIEN) 10 MG tablet Take 1 tablet (10 mg total) by mouth at bedtime as needed. for sleep 30 tablet 0   No current facility-administered medications for this visit.    Review of Systems: No chest pain. No shortness of breath. No urinary complaints.    Physical Exam  Wt Readings from Last 3 Encounters:  09/15/21 246 lb (111.6 kg)  08/05/21 243 lb 4 oz (110.3 kg)  01/29/21 226 lb 2 oz (102.6 kg)    BP 132/78   Pulse 88   Ht 5' 9.25" (1.759 m)   Wt 245 lb (111.1 kg)   SpO2 98%   BMI 35.92 kg/m  Constitutional:  Generally well appearing female in no acute distress. Psychiatric: Pleasant. Normal mood and affect. Behavior is normal. EENT: Pupils normal.  Conjunctivae are normal. No scleral icterus. Neck supple.  Cardiovascular: Normal rate, regular rhythm. No edema Pulmonary/chest: Effort normal and breath  sounds normal. No wheezing, rales or rhonchi. Abdominal: Soft, nondistended, nontender.  Not tympanitic, flanks not bulging, bowel sounds active throughout. There are no masses palpable.  Rectal: External hemorrhoids, good resting sphincter tone and squeeze pressure seems adequate.  Mildly diminished pelvic descent with bearing  down Neurological: Alert and oriented to person place and time. Skin: Skin is warm and dry. No rashes noted.  Tye Savoy, NP  10/14/2021, 10:54 AM

## 2021-10-14 NOTE — Telephone Encounter (Signed)
Got notice from her insurance company that she may not be taking the losartan medication as directed. Can we verify please

## 2021-10-14 NOTE — Telephone Encounter (Signed)
Patient states she is taking medication regularly

## 2021-10-14 NOTE — Telephone Encounter (Signed)
Spoke with patient today in regards to her Lung cancer screening scan. Patient states she had her last 2 appointments cancelled on her. She does not get reminders when she has those appointments with Pulmonology office. The second time it was cancelled patient states she never got a call to speak with her about the appointment or information needed prior to the appointment. Second time she went for her imaging and was advised it has been cancelled. Per notes in the chart it shows Anselm Lis documented calling patient x 3 on 09/28/21 and not able to speak with her so the scan was cancelled. Patient states she had her phone beside her that whole day and did not get a call or voicemail about this. She works until about 2 30 and has asked to be called after. I asked patient if she has a block on unknown phone numbers by chance as this may be the reason the calls did not go through but patient said she does not have blocks that she knows of.  She wants to get the scan done and would like our help in getting this figured out. Not sure how to help in getting office and patient contacted. Patient states she does not gets call backs from [facility].  I am sending this message to all involved parties and PCP. Thank you for any help with this in advance.

## 2021-10-15 ENCOUNTER — Encounter: Payer: Self-pay | Admitting: Nurse Practitioner

## 2021-10-16 NOTE — Progress Notes (Signed)
Noted  

## 2021-10-20 ENCOUNTER — Other Ambulatory Visit: Payer: Self-pay | Admitting: Family Medicine

## 2021-10-20 ENCOUNTER — Other Ambulatory Visit: Payer: Self-pay | Admitting: Internal Medicine

## 2021-10-20 ENCOUNTER — Other Ambulatory Visit: Payer: Self-pay | Admitting: Nurse Practitioner

## 2021-10-20 DIAGNOSIS — K219 Gastro-esophageal reflux disease without esophagitis: Secondary | ICD-10-CM

## 2021-10-20 DIAGNOSIS — G4700 Insomnia, unspecified: Secondary | ICD-10-CM

## 2021-10-20 DIAGNOSIS — I10 Essential (primary) hypertension: Secondary | ICD-10-CM

## 2021-10-21 NOTE — Telephone Encounter (Signed)
Attempted to contact pt to reschedule lung cancer screening appointments. Called and after one ring it went to voicemail. Left message for pt that we can schedule her an in office shared decision making visit since we are having issues connecting by phone. I asked pt to leave days or time that would work best and we can try to schedule at her convenience.

## 2021-11-10 ENCOUNTER — Other Ambulatory Visit: Payer: Self-pay | Admitting: Family Medicine

## 2021-11-10 DIAGNOSIS — I10 Essential (primary) hypertension: Secondary | ICD-10-CM

## 2021-11-19 ENCOUNTER — Telehealth: Payer: Self-pay | Admitting: Nurse Practitioner

## 2021-11-19 NOTE — Telephone Encounter (Signed)
This is done through pulmonology, have discussed with patient in the past to contact [facility] pulmonology. Not sure what else can be done

## 2021-11-19 NOTE — Telephone Encounter (Signed)
Darlene Gonzalez from [facility] patient is not receiving phone call to have consultation before her CT scan. Call back number [phone] reference number [id number].

## 2021-11-22 ENCOUNTER — Telehealth: Payer: Self-pay | Admitting: *Deleted

## 2021-11-22 NOTE — Telephone Encounter (Signed)
On 11/19/21 I was able to speak with pt and rescheduled her shared decision making visit to 12/21/2021 and lung screening CT for 12/22/2021. Pt verbalized understanding and had no questions.

## 2021-11-26 ENCOUNTER — Telehealth: Payer: Self-pay | Admitting: Nurse Practitioner

## 2021-11-26 DIAGNOSIS — R202 Paresthesia of skin: Secondary | ICD-10-CM

## 2021-11-26 DIAGNOSIS — E785 Hyperlipidemia, unspecified: Secondary | ICD-10-CM

## 2021-11-26 DIAGNOSIS — I10 Essential (primary) hypertension: Secondary | ICD-10-CM

## 2021-11-26 DIAGNOSIS — R7303 Prediabetes: Secondary | ICD-10-CM

## 2021-11-26 DIAGNOSIS — R32 Unspecified urinary incontinence: Secondary | ICD-10-CM

## 2021-11-26 NOTE — Telephone Encounter (Signed)
I spoke with pt;pt said she is requesting urgent referrals for neurology, urology, and dermatology. Pt wants neurology referral at [facility] (insurance told pt [facility] is in network)pt wants referral to [facility] in [place] if they have the right specialist. If not pt wants [facility] referral to a specialist in [place] or [place] area. Pt said has had symptoms that started last year but are worsening for all 3 referrals. Pt said since last year she has had a lot of coughing and when pt coughs she loses her balance, gets lightheaded, and feels like she is falling to the side. Pt said at times it is like she is out of her body watching herself fall to the side but she cannot stop herself from doing it. Pt said some observers have witnessed this and said pt looks like she is having a seizure. Pt said when she coughs it is like her body is not getting enough oxygen; pt said she is lightheaded and the room spins all at the same time. Pt said recently she was in a chair (could not discern if pt was sitting or standing) but pt fell to the side without injury. Pt said she coughs a lot so recently this happens every day when pt coughs. Pt said the symptoms are happening more often and the symptoms are worsening. Pt not having any H/A, no CP or SOB and no vision changes.pt has not lost consciousness or passed out. Pt also wants urology referral because pt feels like she is constantly urinating; sometimes pt is incontinent of urine and the surgery pt had for this has not helped. The last referral for dermatology was already given but appt was not until Nov and pt wants urgent referrals and would like to see dermatologist next wk. Pt has rash that is spreading; rash on arms and back now. Pt said she only wants to see specialist that can do something about her problems; pt said if she is paying out $65.00 she wants to see someone who knows what they are doing and can help her. Sending note to Romilda Garret NP and Anastasiya CMA.pt  request cb after reviewed by Romilda Garret NP.

## 2021-11-26 NOTE — Telephone Encounter (Signed)
Left message to call back  Please get more information, what symptoms is patient having and what referral she is needing?

## 2021-11-26 NOTE — Telephone Encounter (Signed)
Patient called back in stating she needs a referral for an Web designer. She stated that she have on going issues that you know about. She would like for the referrals to be sent over to [facility].

## 2021-11-26 NOTE — Telephone Encounter (Signed)
Making a referral urgent may not get her in any faster. In regards to her symptoms I do not believe she has talked to me about these. She did mention to me about psoriasis in the past. I gave her some triamcinolone cream? Did that help? Is this a new rash? She can call other derm offices and see if they can see her quicker and I can place a new referral.  In regards to her coughing and feeling the way she is it may need be helpful to come into office and see if we can get the cough fixed. It sounds like if the cough is fixed it may make a difference. Also looks like she is established with Dr Metta Clines with [facility]  In regards to urology, again coming into office to get more information to guide the care would be her best option.  Not sure if she is not happy with the specialist she has seen before or with the care I am delivering. Clarification would be nice

## 2021-11-26 NOTE — Telephone Encounter (Signed)
Pt returned call requesting a call back to discuss symptoms . [phone]

## 2021-11-26 NOTE — Telephone Encounter (Signed)
Left message to call back and sent mychart message. We need to know the symptoms to use for diagnoses, can not assume what it is, no recent notes with need of referrals. Also, [place] as in [place] or with [facility].

## 2021-11-26 NOTE — Telephone Encounter (Signed)
Patient called in and stated that she wanted to speak with someone in regards to getting some referrals put in.

## 2021-11-29 NOTE — Telephone Encounter (Signed)
Spoke with patient. Cream did not help, same rash. Patient will follow up with Dr Juel Burrow office to make sure she is on cancellation list.  She saw Dr Tomi Likens for numbness/tingling of feet but was suppose to go back in 6 months from original appointment for further testing and the person that was doing that test was out and booked out so that was not done. Patient states she discuss her symptoms of getting the coughing spells and then feeling like she is out of her body experience and unsteady with the provider in the past. She has falling due to this. She is done with [facility] and wants to go to [facility] like in [place] area.  She has seen [facility] for incontinence issues and was given medication in the past, did not want to take that and could not afford surgery. She would like second opinion with [facility] also.  Patient did not want to come in here for an appointment as this has been on going and we are not specialists and can not help her.

## 2021-11-30 NOTE — Addendum Note (Signed)
Addended by: Michela Pitcher on: 11/30/2021 07:28 AM   Modules accepted: Orders

## 2021-11-30 NOTE — Telephone Encounter (Signed)
Referrals placed 

## 2021-12-08 ENCOUNTER — Telehealth: Payer: Self-pay | Admitting: Nurse Practitioner

## 2021-12-08 ENCOUNTER — Other Ambulatory Visit: Payer: Self-pay | Admitting: Nurse Practitioner

## 2021-12-08 DIAGNOSIS — G4700 Insomnia, unspecified: Secondary | ICD-10-CM

## 2021-12-08 NOTE — Telephone Encounter (Signed)
Patient called in to speak with anastaysia,she stated that she suppose to be doing some research for her. She would like a phone call.

## 2021-12-08 NOTE — Telephone Encounter (Signed)
Left message-I think this is about referrals that were placed earlier this month. Patient wants it to be sent to [facility], please process for the patient. Orders in the chart already.

## 2021-12-08 NOTE — Telephone Encounter (Signed)
Mychart letter sent to pt with office information on [facility] referral//ELEA

## 2021-12-13 ENCOUNTER — Encounter: Payer: Self-pay | Admitting: *Deleted

## 2021-12-20 ENCOUNTER — Other Ambulatory Visit: Payer: Self-pay | Admitting: Family Medicine

## 2021-12-20 DIAGNOSIS — I10 Essential (primary) hypertension: Secondary | ICD-10-CM

## 2021-12-21 ENCOUNTER — Ambulatory Visit (INDEPENDENT_AMBULATORY_CARE_PROVIDER_SITE_OTHER): Payer: Managed Care, Other (non HMO) | Admitting: Acute Care

## 2021-12-21 ENCOUNTER — Encounter: Payer: Self-pay | Admitting: Acute Care

## 2021-12-21 DIAGNOSIS — F17210 Nicotine dependence, cigarettes, uncomplicated: Secondary | ICD-10-CM

## 2021-12-21 NOTE — Patient Instructions (Signed)
Thank you for participating in the [facility] Cancer Screening Program. It was our pleasure to meet you today. We will call you with the results of your scan within the next few days. Your scan will be assigned a Lung RADS category score by the physicians reading the scans.  This Lung RADS score determines follow up scanning.  See below for description of categories, and follow up screening recommendations. We will be in touch to schedule your follow up screening annually or based on recommendations of our providers. We will fax a copy of your scan results to your Primary Care Physician, or the physician who referred you to the program, to ensure they have the results. Please call the office if you have any questions or concerns regarding your scanning experience or results.  Our office number is [phone]. Please speak with Denise Phelps, RN. , or  Denise Buckner RN, They are  our Lung Cancer Screening RN.'s If They are unavailable when you call, Please leave a message on the voice mail. We will return your call at our earliest convenience.This voice mail is monitored several times a day.  Remember, if your scan is normal, we will scan you annually as long as you continue to meet the criteria for the program. (Age 55-77, Current smoker or smoker who has quit within the last 15 years). If you are a smoker, remember, quitting is the single most powerful action that you can take to decrease your risk of lung cancer and other pulmonary, breathing related problems. We know quitting is hard, and we are here to help.  Please let us know if there is anything we can do to help you meet your goal of quitting. If you are a former smoker, congratulations. We are proud of you! Remain smoke free! Remember you can refer friends or family members through the number above.  We will screen them to make sure they meet criteria for the program. Thank you for helping us take better care of you by  participating in Lung Screening.  You can receive free nicotine replacement therapy ( patches, gum or mints) by calling 1-800-QUIT NOW. Please call so we can get you on the path to becoming  a non-smoker. I know it is hard, but you can do this!  Lung RADS Categories:  Lung RADS 1: no nodules or definitely non-concerning nodules.  Recommendation is for a repeat annual scan in 12 months.  Lung RADS 2:  nodules that are non-concerning in appearance and behavior with a very low likelihood of becoming an active cancer. Recommendation is for a repeat annual scan in 12 months.  Lung RADS 3: nodules that are probably non-concerning , includes nodules with a low likelihood of becoming an active cancer.  Recommendation is for a 6-month repeat screening scan. Often noted after an upper respiratory illness. We will be in touch to make sure you have no questions, and to schedule your 6-month scan.  Lung RADS 4 A: nodules with concerning findings, recommendation is most often for a follow up scan in 3 months or additional testing based on our provider's assessment of the scan. We will be in touch to make sure you have no questions and to schedule the recommended 3 month follow up scan.  Lung RADS 4 B:  indicates findings that are concerning. We will be in touch with you to schedule additional diagnostic testing based on our provider's  assessment of the scan.  Other options for assistance in smoking cessation (   As covered by your insurance benefits)  Hypnosis for smoking cessation  [employer] [phone]  Acupuncture for smoking cessation  [facility] [phone]   

## 2021-12-21 NOTE — Progress Notes (Signed)
Virtual Visit via Telephone Note  I connected with Darlene Gonzalez on 12/21/21 at  3:00 PM EDT by telephone and verified that I am speaking with the correct person using two identifiers.  Location: Patient:  At home Provider: [place], [place], [place], [address]    I discussed the limitations, risks, security and privacy concerns of performing an evaluation and management service by telephone and the availability of in person appointments. I also discussed with the patient that there may be a patient responsible charge related to this service. The patient expressed understanding and agreed to proceed.   Shared Decision Making Visit Lung Cancer Screening Program [phone])   Eligibility: Age 17 y.o. Pack Years Smoking History Calculation 41 pack year smoking history (# packs/per year x # years smoked) Recent History of coughing up blood  no Unexplained weight loss? no ( >Than 15 pounds within the last 6 months ) Prior History Lung / other cancer no (Diagnosis within the last 5 years already requiring surveillance chest CT Scans). Smoking Status Current Smoker Former Smokers: Years since quit:  NA  Quit Date:  NA  Visit Components: Discussion included one or more decision making aids. yes Discussion included risk/benefits of screening. yes Discussion included potential follow up diagnostic testing for abnormal scans. yes Discussion included meaning and risk of over diagnosis. yes Discussion included meaning and risk of False Positives. yes Discussion included meaning of total radiation exposure. yes  Counseling Included: Importance of adherence to annual lung cancer LDCT screening. yes Impact of comorbidities on ability to participate in the program. yes Ability and willingness to under diagnostic treatment. yes  Smoking Cessation Counseling: Current Smokers:  Discussed importance of smoking cessation. yes Information about tobacco cessation classes and  interventions provided to patient. yes Patient provided with "ticket" for LDCT Scan. yes Symptomatic Patient. no  Counseling NA Diagnosis Code: Tobacco Use Z72.0 Asymptomatic Patient yes  Counseling (Intermediate counseling: > three minutes counseling) M0102 Former Smokers:  Discussed the importance of maintaining cigarette abstinence. yes Diagnosis Code: Personal History of Nicotine Dependence. V25.366 Information about tobacco cessation classes and interventions provided to patient. Yes Patient provided with "ticket" for LDCT Scan. yes Written Order for Lung Cancer Screening with LDCT placed in Epic. Yes (CT Chest Lung Cancer Screening Low Dose W/O CM) [id number] Z12.2-Screening of respiratory organs Z87.891-Personal history of nicotine dependence  I have spent 25 minutes of face to face/ virtual visit   time with  Darlene Gonzalez discussing the risks and benefits of lung cancer screening. We viewed / discussed a power point together that explained in detail the above noted topics. We paused at intervals to allow for questions to be asked and answered to ensure understanding.We discussed that the single most powerful action that she can take to decrease her risk of developing lung cancer is to quit smoking. We discussed whether or not she is ready to commit to setting a quit date. We discussed options for tools to aid in quitting smoking including nicotine replacement therapy, non-nicotine medications, support groups, Quit Smart classes, and behavior modification. We discussed that often times setting smaller, more achievable goals, such as eliminating 1 cigarette a day for a week and then 2 cigarettes a day for a week can be helpful in slowly decreasing the number of cigarettes smoked. This allows for a sense of accomplishment as well as providing a clinical benefit. I provided  her  with smoking cessation  information  with contact information for community resources, classes,  free nicotine replacement  therapy, and access to mobile apps, text messaging, and on-line smoking cessation help. I have also provided  her  the office contact information in the event she needs to contact me, or the screening staff. We discussed the time and location of the scan, and that either Doroteo Glassman RN, Joella Prince, RN  or I will call / send a letter with the results within 24-72 hours of receiving them. The patient verbalized understanding of all of  the above and had no further questions upon leaving the office. They have my contact information in the event they have any further questions.  I spent 3 minutes counseling on smoking cessation and the health risks of continued tobacco abuse.  I explained to the patient that there has been a high incidence of coronary artery disease noted on these exams. I explained that this is a non-gated exam therefore degree or severity cannot be determined. This patient is on statin therapy. I have asked the patient to follow-up with their PCP regarding any incidental finding of coronary artery disease and management with diet or medication as their PCP  feels is clinically indicated. The patient verbalized understanding of the above and had no further questions upon completion of the visit.      Magdalen Spatz, NP 12/21/2021

## 2021-12-22 ENCOUNTER — Ambulatory Visit
Admission: RE | Admit: 2021-12-22 | Discharge: 2021-12-22 | Disposition: A | Discharge status: Home / Self Care | Payer: Managed Care, Other (non HMO) | Source: Ambulatory Visit | Attending: Acute Care | Admitting: Acute Care

## 2021-12-22 DIAGNOSIS — Z87891 Personal history of nicotine dependence: Secondary | ICD-10-CM

## 2021-12-22 DIAGNOSIS — Z122 Encounter for screening for malignant neoplasm of respiratory organs: Secondary | ICD-10-CM

## 2021-12-22 DIAGNOSIS — F17210 Nicotine dependence, cigarettes, uncomplicated: Secondary | ICD-10-CM

## 2021-12-22 MED ORDER — LOSARTAN POTASSIUM 50 MG PO TABS: 50.0000 mg | ORAL_TABLET | Freq: Every day | ORAL | 1 refills | Status: DC

## 2021-12-24 ENCOUNTER — Other Ambulatory Visit: Payer: Self-pay

## 2021-12-24 DIAGNOSIS — F17210 Nicotine dependence, cigarettes, uncomplicated: Secondary | ICD-10-CM

## 2021-12-24 DIAGNOSIS — Z87891 Personal history of nicotine dependence: Secondary | ICD-10-CM

## 2021-12-24 DIAGNOSIS — Z122 Encounter for screening for malignant neoplasm of respiratory organs: Secondary | ICD-10-CM

## 2022-01-04 ENCOUNTER — Other Ambulatory Visit: Payer: Self-pay | Admitting: Nurse Practitioner

## 2022-01-04 DIAGNOSIS — R32 Unspecified urinary incontinence: Secondary | ICD-10-CM

## 2022-01-05 NOTE — Telephone Encounter (Signed)
Order placed to check lipids and A1C. This needs to be a fasting visit. Can we call and get the labs scheduled please

## 2022-01-05 NOTE — Addendum Note (Signed)
Addended by: Michela Pitcher on: 01/05/2022 01:34 PM   Modules accepted: Orders

## 2022-01-06 ENCOUNTER — Encounter: Payer: Self-pay | Admitting: *Deleted

## 2022-01-09 ENCOUNTER — Other Ambulatory Visit: Payer: Self-pay | Admitting: Internal Medicine

## 2022-01-09 DIAGNOSIS — K219 Gastro-esophageal reflux disease without esophagitis: Secondary | ICD-10-CM

## 2022-01-10 ENCOUNTER — Other Ambulatory Visit: Payer: Self-pay | Admitting: Nurse Practitioner

## 2022-01-10 DIAGNOSIS — I10 Essential (primary) hypertension: Secondary | ICD-10-CM

## 2022-01-10 NOTE — Telephone Encounter (Signed)
Has an appt on 02/03/22

## 2022-01-19 ENCOUNTER — Other Ambulatory Visit: Payer: Self-pay | Admitting: Nurse Practitioner

## 2022-01-19 DIAGNOSIS — G4700 Insomnia, unspecified: Secondary | ICD-10-CM

## 2022-02-03 ENCOUNTER — Encounter: Payer: Self-pay | Admitting: Nurse Practitioner

## 2022-02-03 ENCOUNTER — Ambulatory Visit: Payer: Managed Care, Other (non HMO) | Admitting: Nurse Practitioner

## 2022-02-03 ENCOUNTER — Ambulatory Visit (INDEPENDENT_AMBULATORY_CARE_PROVIDER_SITE_OTHER)
Admission: RE | Admit: 2022-02-03 | Discharge: 2022-02-03 | Disposition: A | Discharge status: Home / Self Care | Payer: Managed Care, Other (non HMO) | Source: Ambulatory Visit | Attending: Nurse Practitioner | Admitting: Nurse Practitioner

## 2022-02-03 VITALS — BP 142/78 | HR 88 | Temp 98.0°F | Ht 69.25 in | Wt 247.0 lb

## 2022-02-03 DIAGNOSIS — L409 Psoriasis, unspecified: Secondary | ICD-10-CM

## 2022-02-03 DIAGNOSIS — M79672 Pain in left foot: Secondary | ICD-10-CM | POA: Diagnosis not present

## 2022-02-03 DIAGNOSIS — F17200 Nicotine dependence, unspecified, uncomplicated: Secondary | ICD-10-CM | POA: Diagnosis not present

## 2022-02-03 DIAGNOSIS — K219 Gastro-esophageal reflux disease without esophagitis: Secondary | ICD-10-CM | POA: Diagnosis not present

## 2022-02-03 DIAGNOSIS — Z0000 Encounter for general adult medical examination without abnormal findings: Secondary | ICD-10-CM | POA: Diagnosis not present

## 2022-02-03 DIAGNOSIS — F5101 Primary insomnia: Secondary | ICD-10-CM

## 2022-02-03 DIAGNOSIS — R7303 Prediabetes: Secondary | ICD-10-CM

## 2022-02-03 DIAGNOSIS — I10 Essential (primary) hypertension: Secondary | ICD-10-CM

## 2022-02-03 MED ORDER — ZOLPIDEM TARTRATE ER 12.5 MG PO TBCR: 12.5000 mg | EXTENDED_RELEASE_TABLET | Freq: Every evening | ORAL | 0 refills | Status: DC | PRN

## 2022-02-03 NOTE — Progress Notes (Signed)
Established Patient Office Visit  Subjective   Patient ID: Darlene Gonzalez, female    DOB: June 14, 1960  Age: 61 y.o. MRN: [id number]  Chief Complaint  Patient presents with   Follow-up    Prediabetes    HPI  HLD: Patient currently maintained on simvastatin 20 mg.  Has tried rosuvastatin in the past but it gave her muscle cramps.  Pending lipid panel today.  Insomnia: Patient is currently maintained on Ambien 10 mg nightly.  States that it does not work as of late.  She has told me she she has tried more than 1 tablet in the past and informed not to do that has not been as of late.  She has tried [place] in the past without great benefit.  HTN: does not check her blood pressure at home for complete physical and follow up of chronic conditions.  Immunizations: -Tetanus:2022 -Influenza:10/22/2021 -Shingles: States she has had both vaccines at local pharmacy. -Pneumonia: Too young  -HPV: Aged out  Diet: Thurman. 2-4 meals and some snacking. Trying to get off sodas. Trying to do more water.  Exercise: No regular exercise.  Eye exam: Completes annually. In the summer. She had a style taken care Dental exam: Completes semi-annually   Pap Smear: Completed in patient is followed by GYN Mammogram: Completed in 10/10/2021 with 60-monthfollow-up  Colonoscopy: Completed in 2016, 10-year recall due in 3 years Lung Cancer Screening: Completed in November 20 23 to 151-monthollow-up Dexa: too young   Sleep: States that she will take ambien at 630 and get up at 11 and get up 430-445. Does have to go to the bathroom sometimes she is just up. She does not sleep through. Tried lunesta, and [place] she snores    Review of Systems  Constitutional:  Negative for chills and fever.  Respiratory:  Negative for shortness of breath.   Cardiovascular:  Negative for chest pain.  Gastrointestinal:  Negative for abdominal pain, nausea and vomiting.       BM with some incontinece    Genitourinary:  Negative for dysuria and hematuria.  Musculoskeletal:  Positive for joint pain.  Neurological:  Positive for headaches.  Psychiatric/Behavioral:  Negative for hallucinations and suicidal ideas.       Objective:     BP (!) 142/78   Pulse 88   Temp 98 F (36.7 C) (Temporal)   Ht 5' 9.25" (1.759 m)   Wt 247 lb (112 kg)   SpO2 99%   BMI 36.21 kg/m  BP Readings from Last 3 Encounters:  02/03/22 (!) 142/78  10/14/21 132/78  09/15/21 (!) 142/76   Wt Readings from Last 3 Encounters:  02/03/22 247 lb (112 kg)  10/14/21 245 lb (111.1 kg)  09/15/21 246 lb (111.6 kg)      Physical Exam Vitals and nursing note reviewed.  Constitutional:      Appearance: Normal appearance. She is obese.  HENT:     Right Ear: Tympanic membrane, ear canal and external ear normal.     Left Ear: Tympanic membrane, ear canal and external ear normal.     Mouth/Throat:     Mouth: Mucous membranes are moist.     Pharynx: Oropharynx is clear.  Eyes:     Extraocular Movements: Extraocular movements intact.     Pupils: Pupils are equal, round, and reactive to light.  Cardiovascular:     Rate and Rhythm: Normal rate and regular rhythm.     Heart sounds: Normal heart sounds.  Pulmonary:     Effort: Pulmonary effort is normal.     Breath sounds: Normal breath sounds.  Abdominal:     General: Bowel sounds are normal.  Lymphadenopathy:     Cervical: No cervical adenopathy.  Skin:    General: Skin is warm.  Neurological:     General: No focal deficit present.     Mental Status: She is alert.     Comments: Bilateral upper and lower extremity strength 5/5  Psychiatric:        Mood and Affect: Mood normal.        Behavior: Behavior normal.        Thought Content: Thought content normal.        Judgment: Judgment normal.      No results found for any visits on 02/03/22.    The 10-year ASCVD risk score (Arnett DK, et al., 2019) is: 22.1%    Assessment & Plan:   Problem List  Items Addressed This Visit       Cardiovascular and Mediastinum   Essential hypertension (Chronic)    Patient currently maintained on losartan and Maxide.  Take medication as prescribed.  Blood pressure slightly above goal today.  She will continue working on lifestyle modifications if elevated at next office visit we will consider increasing medication at that juncture.      Relevant Orders   CBC   Comprehensive metabolic panel     Digestive   GERD (Chronic)    Patient is followed by Dr. Henrene Pastor GI.  Patient did have endoscopy in 2016 and recommended PPI therapy patient currently maintained on Protonix.  Continue taking medication as prescribed follow-up with GI as recommended        Musculoskeletal and Integument   Psoriasis    Has places on left elbow middle lower back and above gluteal cleft per patient report.  She has tried steroid cream without good relief.  Has been referred to dermatologist has not been yet information provided today        Other   TOBACCO ABUSE    Patient still currently smoking.  LDCT up-to-date      Relevant Orders   POCT urinalysis dipstick   Insomnia    Patient currently maintained on Ambien 10 mg.  Is ineffective currently.  Will switch patient to Ambien 12.5 mg controlled release.  She has tried Lunesta in the past and failed has never had a sleep study      Preventative health care - Primary    Discussed age-appropriate immunizations and screening exams.  Patient is up-to-date on Pap smear, colonoscopy.  Up-to-date on vaccines.  Patient given information at discharge about preventative healthcare maintenance in her age range with anticipatory guidance.      Relevant Orders   CBC   Comprehensive metabolic panel   Hemoglobin A1c   TSH   Lipid panel   Pre-diabetes    Pending A1c today.  Continue working on healthy lifestyle modifications.  She is trying to cut back on sodas currently      Pain of left heel    Marked tenderness to the  plantar calcaneal area.  Considering plantar fasciitis pending x-ray.  Patient has not tried anything thus far      Relevant Orders   DG Foot Complete Left    Return in about 6 months (around 08/05/2022) for chronic condiditon rechecka.    Romilda Garret, NP

## 2022-02-03 NOTE — Assessment & Plan Note (Signed)
Patient is followed by Dr. Henrene Pastor GI.  Patient did have endoscopy in 2016 and recommended PPI therapy patient currently maintained on Protonix.  Continue taking medication as prescribed follow-up with GI as recommended

## 2022-02-03 NOTE — Assessment & Plan Note (Signed)
Has places on left elbow middle lower back and above gluteal cleft per patient report.  She has tried steroid cream without good relief.  Has been referred to dermatologist has not been yet information provided today

## 2022-02-03 NOTE — Assessment & Plan Note (Signed)
Patient currently maintained on Ambien 10 mg.  Is ineffective currently.  Will switch patient to Ambien 12.5 mg controlled release.  She has tried [place] in the past and failed has never had a sleep study

## 2022-02-03 NOTE — Assessment & Plan Note (Signed)
Discussed age-appropriate immunizations and screening exams.  Patient is up-to-date on Pap smear, colonoscopy.  Up-to-date on vaccines.  Patient given information at discharge about preventative healthcare maintenance in her age range with anticipatory guidance.

## 2022-02-03 NOTE — Assessment & Plan Note (Signed)
Patient still currently smoking.  LDCT up-to-date

## 2022-02-03 NOTE — Assessment & Plan Note (Signed)
Patient currently maintained on losartan and Maxide.  Take medication as prescribed.  Blood pressure slightly above goal today.  She will continue working on lifestyle modifications if elevated at next office visit we will consider increasing medication at that juncture.

## 2022-02-03 NOTE — Assessment & Plan Note (Signed)
Pending A1c today.  Continue working on healthy lifestyle modifications.  She is trying to cut back on sodas currently

## 2022-02-03 NOTE — Patient Instructions (Signed)
Nice to see you today I will be in touch with the labs and xray once I have them  Follow up with me in 6 months,sooner if you need me  Dr. Jenny Reichmann hall  Address: [address], [place], [place] [address] Areas served: [id number] Hours:  Open ? Closes 5?PM Phone: [phone]

## 2022-02-03 NOTE — Assessment & Plan Note (Signed)
Marked tenderness to the plantar calcaneal area.  Considering plantar fasciitis pending x-ray.  Patient has not tried anything thus far

## 2022-02-04 LAB — CBC
HCT: 40.2 % (ref 36.0–46.0)
Hemoglobin: 13.4 g/dL (ref 12.0–15.0)
MCHC: 33.2 g/dL (ref 30.0–36.0)
MCV: 92.2 fl (ref 78.0–100.0)
Platelets: 273 10*3/uL (ref 150.0–400.0)
RBC: 4.36 Mil/uL (ref 3.87–5.11)
RDW: 14.1 % (ref 11.5–15.5)
WBC: 9.3 10*3/uL (ref 4.0–10.5)

## 2022-02-04 LAB — COMPREHENSIVE METABOLIC PANEL
ALT: 20 U/L (ref 0–35)
AST: 23 U/L (ref 0–37)
Albumin: 4.5 g/dL (ref 3.5–5.2)
Alkaline Phosphatase: 114 U/L (ref 39–117)
BUN: 12 mg/dL (ref 6–23)
CO2: 36 mEq/L — ABNORMAL HIGH (ref 19–32)
Calcium: 9.5 mg/dL (ref 8.4–10.5)
Chloride: 96 mEq/L (ref 96–112)
Creatinine, Ser: 0.81 mg/dL (ref 0.40–1.20)
GFR: 77.86 mL/min (ref >60.00)
Glucose, Bld: 89 mg/dL (ref 70–99)
Potassium: 3.6 mEq/L (ref 3.5–5.1)
Sodium: 141 mEq/L (ref 135–145)
Total Bilirubin: 0.5 mg/dL (ref 0.2–1.2)
Total Protein: 7.5 g/dL (ref 6.0–8.3)

## 2022-02-04 LAB — TSH: TSH: 3.39 u[IU]/mL (ref 0.35–5.50)

## 2022-02-04 LAB — LIPID PANEL
Cholesterol: 187 mg/dL (ref 0–200)
HDL: 57.3 mg/dL (ref >39.00)
LDL Cholesterol: 110 mg/dL — ABNORMAL HIGH (ref 0–99)
NonHDL: 129.58
Total CHOL/HDL Ratio: 3
Triglycerides: 98 mg/dL (ref 0.0–149.0)
VLDL: 19.6 mg/dL (ref 0.0–40.0)

## 2022-02-04 LAB — HEMOGLOBIN A1C: Hgb A1c MFr Bld: 6.3 % (ref 4.6–6.5)

## 2022-02-07 ENCOUNTER — Other Ambulatory Visit: Payer: Self-pay | Admitting: Nurse Practitioner

## 2022-02-07 DIAGNOSIS — M79672 Pain in left foot: Secondary | ICD-10-CM

## 2022-02-07 MED ORDER — MELOXICAM 15 MG PO TABS: 15.0000 mg | ORAL_TABLET | Freq: Every day | ORAL | 0 refills | Status: DC

## 2022-02-08 ENCOUNTER — Telehealth: Payer: Self-pay | Admitting: Nurse Practitioner

## 2022-02-08 ENCOUNTER — Telehealth: Payer: Self-pay | Admitting: *Deleted

## 2022-02-08 MED ORDER — ZEPBOUND 2.5 MG/0.5ML ~~LOC~~ SOAJ: 2.5000 mg | SUBCUTANEOUS | 0 refills | Status: DC

## 2022-02-08 NOTE — Telephone Encounter (Signed)
-----   Message from Pete Pelt, LPN sent at 20/11/710 10:46 AM EST ----- Patient notified as instructed by telephone and verbalized understanding. Patient stated that she is willing to try the injections for weight loss.  [place]

## 2022-02-08 NOTE — Telephone Encounter (Signed)
Left message on voicemail for patient to call the office back. Need to give patient her lab results.

## 2022-02-08 NOTE — Telephone Encounter (Signed)
Called patient and lab results were given. See result notes.

## 2022-02-08 NOTE — Telephone Encounter (Signed)
Patient returned call about her lab results,would like a call back.

## 2022-03-25 ENCOUNTER — Telehealth: Payer: Self-pay | Admitting: Nurse Practitioner

## 2022-03-25 NOTE — Telephone Encounter (Signed)
Patient called and stated she wanted to discuss her results because she don't understand them and they were done at [facility]. Call back number [phone].

## 2022-03-25 NOTE — Telephone Encounter (Signed)
LVM for pt to return call

## 2022-03-25 NOTE — Telephone Encounter (Signed)
We can see what she wants. But I would advise her to contact [facility] to discuss the results. But please call and see what she is looking for

## 2022-03-25 NOTE — Telephone Encounter (Signed)
Only results in [facility] are UA from yesterday, along with a UroGyn visit.

## 2022-03-25 NOTE — Telephone Encounter (Signed)
Patient called back and I advised her to call [facility] to discuss these results,but she said that [facility] told her to call her PCP. A good return number for her is [phone]

## 2022-03-25 NOTE — Telephone Encounter (Signed)
Spoke with Dr. Venora Maples office and they are going to have a nurse call pt to review her test results.

## 2022-05-25 ENCOUNTER — Ambulatory Visit
Admission: EM | Admit: 2022-05-25 | Discharge: 2022-05-25 | Disposition: A | Discharge status: Home / Self Care | Payer: Managed Care, Other (non HMO) | Attending: Emergency Medicine | Admitting: Emergency Medicine

## 2022-05-25 DIAGNOSIS — J029 Acute pharyngitis, unspecified: Secondary | ICD-10-CM

## 2022-05-25 DIAGNOSIS — J302 Other seasonal allergic rhinitis: Secondary | ICD-10-CM | POA: Diagnosis not present

## 2022-05-25 DIAGNOSIS — I10 Essential (primary) hypertension: Secondary | ICD-10-CM | POA: Diagnosis not present

## 2022-05-25 LAB — POCT RAPID STREP A (OFFICE): Rapid Strep A Screen: NEGATIVE

## 2022-05-25 MED ORDER — CETIRIZINE HCL 10 MG PO TABS: 10.0000 mg | ORAL_TABLET | Freq: Every day | ORAL | 0 refills | Status: AC

## 2022-05-25 MED ORDER — FLUTICASONE PROPIONATE 50 MCG/ACT NA SUSP: 1.0000 | Freq: Every day | NASAL | 0 refills | Status: DC

## 2022-05-25 MED ORDER — LIDOCAINE VISCOUS HCL 2 % MT SOLN: 15.0000 mL | OROMUCOSAL | 0 refills | Status: DC | PRN

## 2022-05-25 MED ORDER — IBUPROFEN 600 MG PO TABS: 600.0000 mg | ORAL_TABLET | Freq: Four times a day (QID) | ORAL | 0 refills | Status: DC | PRN

## 2022-05-25 NOTE — ED Triage Notes (Signed)
Sore throat that started 3 days ago, cough, ear pain, runny nose that started 1 week ago. Taking benadryl, nucin with no relief of symptoms.

## 2022-05-25 NOTE — ED Provider Notes (Signed)
Darlene Gonzalez    CSN: [id number] Arrival date & time: 05/25/22  0909      History   Chief Complaint Chief Complaint  Patient presents with   Sore Throat   Cough    HPI Darlene Gonzalez is a 62 y.o. female.  Patient presents with sore throat x 3 days.  She also reports 1 week history of runny nose, ear pain, congestion, cough.  No fever, rash, shortness of breath, or other symptoms.  No OTC medications taken today.  Her medical history includes hypertension, current everyday smoker.  The history is provided by the patient and medical records.    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    Patient Active Problem List   Diagnosis Date Noted   Pain of left heel 02/03/2022   Pre-diabetes 08/05/2021   Bilateral lower extremity edema 08/05/2021   Class 2 severe obesity due to excess calories with serious comorbidity and body mass index (BMI) of 36.0 to 36.9 in adult 08/05/2021   Psoriasis 08/05/2021   Family history of diabetes mellitus 01/29/2021   Numbness and tingling of foot 01/29/2021   Preventative health care 01/29/2021   Cellulitis of left orbital region 12/23/2020   Hyperlipidemia 11/06/2017   Need for immunization against influenza 11/06/2017   Breast cancer screening 04/28/2017   Carotid atherosclerosis, bilateral 04/28/2017   Insomnia 07/28/2015   Screening for ischemic heart disease 01/02/2014   Screening for  osteoporosis 01/02/2014   Primary osteoarthritis of both knees 09/26/2013   ESOPHAGEAL STRICTURE 10/21/2008   GERD 10/21/2008   PERSONAL HX COLONIC POLYPS 10/21/2008   Antibiotic-induced yeast infection 09/19/2007   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 Authorizing Provider  cetirizine (ZYRTEC ALLERGY) 10 MG tablet Take 1 tablet (10 mg total) by mouth daily. 05/25/22  Yes Sharion Balloon, NP  fluticasone (FLONASE) 50 MCG/ACT nasal spray Place 1 spray into both nostrils  daily. 05/25/22  Yes Sharion Balloon, NP  ibuprofen (ADVIL) 600 MG tablet Take 1 tablet (600 mg total) by mouth every 6 (six) hours as needed. 05/25/22  Yes Sharion Balloon, NP  lidocaine (XYLOCAINE) 2 % solution Use as directed 15 mLs in the mouth or throat as needed for mouth pain. 05/25/22  Yes Sharion Balloon, NP  losartan (COZAAR) 50 MG tablet Take 1 tablet (50 mg total) by mouth daily. 12/22/21  Yes Michela Pitcher, NP  meloxicam (MOBIC) 15 MG tablet Take 1 tablet (15 mg total) by mouth daily. 02/07/22  Yes Michela Pitcher, NP  pantoprazole (PROTONIX) 40 MG tablet Take 1 tablet by mouth once daily 01/10/22  Yes Irene Shipper, MD  simvastatin (ZOCOR) 20 MG tablet Take 1 tablet (20 mg total) by mouth at bedtime. 09/24/21  Yes Michela Pitcher, NP  triamterene-hydrochlorothiazide [facility]) 37.5-25 MG tablet Take 1 tablet by mouth once daily 01/10/22  Yes Michela Pitcher, NP  nystatin-triamcinolone ointment  (MYCOLOG) Apply 1 Application topically 2 (two) times daily. 09/15/21   Chrzanowski, Jami B, NP  tirzepatide (ZEPBOUND) 2.5 MG/0.5ML Pen Inject 2.5 mg into the skin once a week. 02/08/22   Michela Pitcher, NP  zolpidem (AMBIEN CR) 12.5 MG CR tablet Take 1 tablet (12.5 mg total) by mouth at bedtime as needed for sleep. 02/03/22   Michela Pitcher, NP    Family History Family History  Problem Relation Age of Onset   Healthy Mother        Living   Thyroid disease Mother    Hypertension Father        Deceased   Liver disease Father        Cirrhosis   Alcohol abuse Father    Lung cancer Sister    Bladder Cancer Sister    Kidney disease Maternal Aunt    Diabetes Maternal Aunt        x2    Social History Social History   Tobacco Use   Smoking status: Every Day    Packs/day: 0.50    Years: 41.00    Additional pack years: 0.00    Total pack years: 20.50    Types: Cigarettes   Smokeless tobacco: Never  Vaping Use   Vaping Use: Never used  Substance Use Topics   Alcohol use: Yes    Alcohol/week: 12.0 standard drinks of alcohol    Types: 12 Cans of beer per week    Comment: 1 twelve pack a weekend   Drug use: No     Allergies   Crestor [rosuvastatin] and Lisinopril   Review of Systems Review of Systems  Constitutional:  Negative for chills and fever.  HENT:  Positive for congestion, ear pain, postnasal drip, rhinorrhea and sore throat.   Respiratory:  Positive for cough. Negative for shortness of breath.   Cardiovascular:  Negative for chest pain and palpitations.  Gastrointestinal:  Negative for diarrhea and vomiting.  Skin:  Negative for color change and rash.  All other systems reviewed and are negative.    Physical Exam Triage Vital Signs ED Triage Vitals  Enc Vitals Group     BP      Pulse      Resp      Temp      Temp src      SpO2      Weight      Height      Head Circumference  Peak Flow      Pain Score      Pain Loc      Pain Edu?      Excl.  in [place]?    No data found.  Updated Vital Signs BP (!) 165/98 (BP Location: Left Arm)   Pulse 82   Temp 98 F (36.7 C) (Oral)   Resp 19   SpO2 98%   Visual Acuity Right Eye Distance:   Left Eye Distance:   Bilateral Distance:    Right Eye Near:   Left Eye Near:    Bilateral Near:     Physical Exam Vitals and nursing note reviewed.  Constitutional:      General: She is not in acute distress.    Appearance: Normal appearance. She is well-developed. She is not ill-appearing.  HENT:     Right Ear: Tympanic membrane normal.     Left Ear: Tympanic membrane normal.     Nose: Rhinorrhea present.     Mouth/Throat:     Mouth: Mucous membranes are moist.     Pharynx: Oropharynx is clear.  Cardiovascular:     Rate and Rhythm: Normal rate and regular rhythm.     Heart sounds: Normal heart sounds.  Pulmonary:     Effort: Pulmonary effort is normal. No respiratory distress.     Breath sounds: Normal breath sounds.  Musculoskeletal:     Cervical back: Neck supple.  Skin:    General: Skin is warm and dry.  Neurological:     Mental Status: She is alert.  Psychiatric:        Mood and Affect: Mood normal.        Behavior: Behavior normal.      UC Treatments / Results  Labs (all labs ordered are listed, but only abnormal results are displayed) Labs Reviewed  POCT RAPID STREP A (OFFICE)    EKG   Radiology No results found.  Procedures Procedures (including critical care time)  Medications Ordered in UC Medications - No data to display  Initial Impression / Assessment and Plan / UC Course  I have reviewed the triage vital signs and the nursing notes.  Pertinent labs & imaging results that were available during my care of the patient were reviewed by me and considered in my medical decision making (see chart for details).    Seasonal allergies, sore throat, elevated blood pressure reading with hypertension.  Negative.  Treating with viscous lidocaine (gargle and  spit out), Flonase nasal spray, ibuprofen, Zyrtec.  Instructed patient to follow up with her PCP if her symptoms are not improving.  Also discussed with patient that her blood pressure is elevated today and needs to be rechecked by PCP in 2 to 4 weeks.  Education provided on managing hypertension.  She agrees to plan of care.    Final Clinical Impressions(s) / UC Diagnoses   Final diagnoses:  Seasonal allergies  Sore throat  Elevated blood pressure reading in office with diagnosis of hypertension     Discharge Instructions      Use the viscous lidocaine as directed (gargle and spit out).  Use the Flonase nasal spray as directed.  Take the ibuprofen and Zyrtec as directed.  Your blood pressure is elevated today at 152/99; repeat 165/98.  Please have this rechecked by your primary care provider in 2-4 weeks.          ED Prescriptions     Medication Sig Dispense Auth. Provider   lidocaine (XYLOCAINE) 2 % solution  Use as directed 15 mLs in the mouth or throat as needed for mouth pain. 100 mL Sharion Balloon, NP   fluticasone (FLONASE) 50 MCG/ACT nasal spray Place 1 spray into both nostrils daily. 16 g Sharion Balloon, NP   cetirizine (ZYRTEC ALLERGY) 10 MG tablet Take 1 tablet (10 mg total) by mouth daily. 14 tablet Barkley Boards H, NP   ibuprofen (ADVIL) 600 MG tablet Take 1 tablet (600 mg total) by mouth every 6 (six) hours as needed. 30 tablet Sharion Balloon, NP      PDMP not reviewed this encounter.   Sharion Balloon, NP 05/25/22 [phone]

## 2022-05-25 NOTE — Discharge Instructions (Addendum)
Use the viscous lidocaine as directed (gargle and spit out).  Use the Flonase nasal spray as directed.  Take the ibuprofen and Zyrtec as directed.  Your blood pressure is elevated today at 152/99; repeat 165/98.  Please have this rechecked by your primary care provider in 2-4 weeks.

## 2022-06-01 ENCOUNTER — Ambulatory Visit: Payer: Managed Care, Other (non HMO) | Admitting: Nurse Practitioner

## 2022-06-01 ENCOUNTER — Encounter: Payer: Self-pay | Admitting: Nurse Practitioner

## 2022-06-01 VITALS — BP 136/84 | HR 88 | Temp 97.8°F | Resp 16 | Ht 69.25 in | Wt 251.1 lb

## 2022-06-01 DIAGNOSIS — M79672 Pain in left foot: Secondary | ICD-10-CM | POA: Diagnosis not present

## 2022-06-01 DIAGNOSIS — M170 Bilateral primary osteoarthritis of knee: Secondary | ICD-10-CM | POA: Diagnosis not present

## 2022-06-01 DIAGNOSIS — I10 Essential (primary) hypertension: Secondary | ICD-10-CM | POA: Diagnosis not present

## 2022-06-01 DIAGNOSIS — G4700 Insomnia, unspecified: Secondary | ICD-10-CM

## 2022-06-01 MED ORDER — LOSARTAN POTASSIUM 100 MG PO TABS: 100.0000 mg | ORAL_TABLET | Freq: Every day | ORAL | 1 refills | Status: DC

## 2022-06-01 MED ORDER — MELOXICAM 15 MG PO TABS: 15.0000 mg | ORAL_TABLET | Freq: Every day | ORAL | 0 refills | Status: DC

## 2022-06-01 MED ORDER — ZOLPIDEM TARTRATE 10 MG PO TABS: 10.0000 mg | ORAL_TABLET | Freq: Every evening | ORAL | 0 refills | Status: DC | PRN

## 2022-06-01 NOTE — Progress Notes (Signed)
Established Patient Office Visit  Subjective   Patient ID: Darlene Gonzalez, female    DOB: 04-Jun-1960  Age: 62 y.o. MRN: [id number]  Chief Complaint  Patient presents with   Hypertension      HTN: patient is currently maintained on losartan 50mg  and maxizide. She was seen at [facility] recently and her BP was elevated. She is here for a follow up  States that she cannot check BP at home. States that she has no symptoms. Right side arm will go numb and it will come and go.  States has been present for approximately 1 week.  Denies any discrete injury to the neck or arm.  States that she feels it is heavy currenlty. States that she did not notice it today at work  States that she has not taken her blood pressure medications today    Review of Systems  Constitutional:  Negative for chills and fever.  Respiratory:  Negative for shortness of breath.   Cardiovascular:  Negative for chest pain and leg swelling.  Neurological:  Positive for tingling. Negative for headaches.      Objective:     BP 136/84 (BP Location: Left Arm, Cuff Size: Large)   Pulse 88   Temp 97.8 F (36.6 C)   Resp 16   Ht 5' 9.25" (1.759 m)   Wt 251 lb 2 oz (113.9 kg)   SpO2 99%   BMI 36.82 kg/m  BP Readings from Last 3 Encounters:  06/01/22 136/84  05/25/22 (!) 165/98  02/03/22 (!) 142/78   Wt Readings from Last 3 Encounters:  06/01/22 251 lb 2 oz (113.9 kg)  02/03/22 247 lb (112 kg)  10/14/21 245 lb (111.1 kg)      Physical Exam Vitals and nursing note reviewed.  Constitutional:      Appearance: Normal appearance.  Cardiovascular:     Rate and Rhythm: Normal rate and regular rhythm.     Heart sounds: Normal heart sounds.  Pulmonary:     Effort: Pulmonary effort is normal.     Breath sounds: Normal breath sounds.  Musculoskeletal:     Right lower leg: Edema present.     Left lower leg: Edema present.  Neurological:     General: No focal deficit present.     Mental Status: She is alert.      Cranial Nerves: Cranial nerves 2-12 are intact.     Motor: Motor function is intact.     Coordination: Coordination is intact. Finger-Nose-Finger Test normal.     Deep Tendon Reflexes:     Reflex Scores:      Bicep reflexes are 2+ on the right side and 2+ on the left side.      Patellar reflexes are 2+ on the right side and 2+ on the left side.    Comments: Bilateral upper and lower extremity strength 5/5      No results found for any visits on 06/01/22.    The 10-year ASCVD risk score (Arnett DK, et al., 2019) is: 16%    Assessment & Plan:   Problem List Items Addressed This Visit       Cardiovascular and Mediastinum   Essential hypertension - Primary (Chronic)    Patient with a history of hypertension several elevated blood pressure readings.  Will increase losartan from 50 mg to 100 mg.  Continue Maxide follow-up as scheduled to recheck blood pressure      Relevant Medications   losartan (COZAAR) 100 MG tablet  Musculoskeletal and Integument   Primary osteoarthritis of both knees (Chronic)    Patient states meloxicam was beneficial would like a refill refill provided      Relevant Medications   meloxicam (MOBIC) 15 MG tablet     Other   Insomnia    Patient has been on Ambien 10 mg in the past that was not super effective.  We did switch her to 12.5 controlled release which was ineffective.  Refill provided for Ambien 10 mg nightly      Relevant Medications   zolpidem (AMBIEN) 10 MG tablet   Pain of left heel   Relevant Medications   meloxicam (MOBIC) 15 MG tablet    Return if symptoms worsen or fail to improve, for As scheduled .    Audria Nine, NP

## 2022-06-01 NOTE — Assessment & Plan Note (Signed)
Patient with a history of hypertension several elevated blood pressure readings.  Will increase losartan from 50 mg to 100 mg.  Continue Maxide follow-up as scheduled to recheck blood pressure

## 2022-06-01 NOTE — Patient Instructions (Signed)
Nice to see you today I did increase the losartan to 10mg  daily Follow up with me in as scheduled. Sooner if you need me

## 2022-06-01 NOTE — Assessment & Plan Note (Signed)
Patient has been on Ambien 10 mg in the past that was not super effective.  We did switch her to 12.5 controlled release which was ineffective.  Refill provided for Ambien 10 mg nightly

## 2022-06-01 NOTE — Assessment & Plan Note (Signed)
Patient states meloxicam was beneficial would like a refill refill provided

## 2022-07-05 ENCOUNTER — Other Ambulatory Visit: Payer: Self-pay | Admitting: Nurse Practitioner

## 2022-07-05 DIAGNOSIS — I10 Essential (primary) hypertension: Secondary | ICD-10-CM

## 2022-08-05 ENCOUNTER — Encounter: Payer: Self-pay | Admitting: Nurse Practitioner

## 2022-08-05 ENCOUNTER — Ambulatory Visit: Payer: Managed Care, Other (non HMO) | Admitting: Nurse Practitioner

## 2022-08-05 VITALS — BP 130/70 | HR 82 | Temp 97.5°F | Resp 16 | Ht 69.25 in | Wt 252.2 lb

## 2022-08-05 DIAGNOSIS — I6523 Occlusion and stenosis of bilateral carotid arteries: Secondary | ICD-10-CM

## 2022-08-05 DIAGNOSIS — G4700 Insomnia, unspecified: Secondary | ICD-10-CM

## 2022-08-05 DIAGNOSIS — R7303 Prediabetes: Secondary | ICD-10-CM | POA: Diagnosis not present

## 2022-08-05 DIAGNOSIS — F17200 Nicotine dependence, unspecified, uncomplicated: Secondary | ICD-10-CM

## 2022-08-05 DIAGNOSIS — E785 Hyperlipidemia, unspecified: Secondary | ICD-10-CM

## 2022-08-05 DIAGNOSIS — I10 Essential (primary) hypertension: Secondary | ICD-10-CM

## 2022-08-05 MED ORDER — ZOLPIDEM TARTRATE 10 MG PO TABS: 10.0000 mg | ORAL_TABLET | Freq: Every evening | ORAL | 1 refills | Status: DC | PRN

## 2022-08-05 NOTE — Progress Notes (Signed)
   Established Patient Office Visit  Subjective   Patient ID: Darlene Gonzalez, female    DOB: [birth year]  Age: 62 y.o. MRN: [id number]  Chief Complaint  Patient presents with   Medical Management of Chronic Issues    HPI  HTN: patient was seen on 06/01/2022 for hypertension and was having elevated readings she was switched from losartan 50 to 100mg . States that she does not have a cuff. States that she will feel some lightheadenss while waking that is short lived   Insomnia: has been on [place] CR 12.5 that was ineffective. Currently on ambien 10. States that she will get approx    States that she is having frequent urination approx every 1 hour and 45 min. Suppose to get the stimulator on 09/09/2022 temp and permanat 10/11/2022. She is working on stopping to smoke   Review of Systems  Constitutional:  Negative for chills and fever.  Respiratory:  Negative for shortness of breath.   Cardiovascular:  Positive for leg swelling. Negative for chest pain.  Neurological:  Negative for headaches.      Objective:     BP 130/70   Pulse 82   Temp (!) 97.5 F (36.4 C)   Resp 16   Ht 5' 9.25" (1.759 m)   Wt 252 lb 4 oz (114.4 kg)   SpO2 99%   BMI 36.98 kg/m  BP Readings from Last 3 Encounters:  08/05/22 130/70  06/01/22 136/84  05/25/22 (!) 165/98   Wt Readings from Last 3 Encounters:  08/05/22 252 lb 4 oz (114.4 kg)  06/01/22 251 lb 2 oz (113.9 kg)  02/03/22 247 lb (112 kg)      Physical Exam Vitals and nursing note reviewed.  Constitutional:      Appearance: Normal appearance.  Cardiovascular:     Rate and Rhythm: Normal rate and regular rhythm.     Heart sounds: Normal heart sounds.  Pulmonary:     Effort: Pulmonary effort is normal.     Breath sounds: Normal breath sounds.  Musculoskeletal:     Right lower leg: Edema present.     Left lower leg: Edema present.  Neurological:     Mental Status: She is alert.      No results found for any  visits on 08/05/22.    The 10-year ASCVD risk score (Arnett DK, et al., 2019) is: 14.2%    Assessment & Plan:   Problem List Items Addressed This Visit       Cardiovascular and Mediastinum   Essential hypertension (Chronic)    Patient currently on Maxide and losartan 100 mg.  Tolerates medication well.  Blood pressure under good control.  Continue medication as prescribed      Carotid atherosclerosis, bilateral - Primary     Other   TOBACCO ABUSE   Insomnia    Patient still using Ambien on appearing basis.  Continue refill provided today      Relevant Medications   zolpidem (AMBIEN) 10 MG tablet   Hyperlipidemia   Relevant Orders   Lipid panel   Pre-diabetes   Relevant Orders   Hemoglobin A1c    Return in about 6 months (around 02/05/2023) for CPE and Labs.    Audria Nine, NP

## 2022-08-05 NOTE — Assessment & Plan Note (Signed)
Patient still using Ambien on appearing basis.  Continue refill provided today

## 2022-08-05 NOTE — Assessment & Plan Note (Signed)
Patient currently on Maxide and losartan 100 mg.  Tolerates medication well.  Blood pressure under good control.  Continue medication as prescribed

## 2022-08-05 NOTE — Patient Instructions (Signed)
Nice to see you today I want to see you in 6 months for your physical and full panel of labs, sooner if you need me

## 2022-08-08 ENCOUNTER — Other Ambulatory Visit (INDEPENDENT_AMBULATORY_CARE_PROVIDER_SITE_OTHER): Payer: Managed Care, Other (non HMO)

## 2022-08-08 DIAGNOSIS — E785 Hyperlipidemia, unspecified: Secondary | ICD-10-CM

## 2022-08-08 DIAGNOSIS — R7303 Prediabetes: Secondary | ICD-10-CM

## 2022-08-08 LAB — LIPID PANEL
Cholesterol: 202 mg/dL — ABNORMAL HIGH (ref 0–200)
HDL: 43.6 mg/dL (ref >39.00)
LDL Cholesterol: 138 mg/dL — ABNORMAL HIGH (ref 0–99)
NonHDL: 158.77
Total CHOL/HDL Ratio: 5
Triglycerides: 104 mg/dL (ref 0.0–149.0)
VLDL: 20.8 mg/dL (ref 0.0–40.0)

## 2022-08-08 LAB — HEMOGLOBIN A1C: Hgb A1c MFr Bld: 6.4 % (ref 4.6–6.5)

## 2022-08-15 ENCOUNTER — Other Ambulatory Visit: Payer: Self-pay | Admitting: Nurse Practitioner

## 2022-08-15 ENCOUNTER — Telehealth: Payer: Self-pay | Admitting: Nurse Practitioner

## 2022-08-15 DIAGNOSIS — E785 Hyperlipidemia, unspecified: Secondary | ICD-10-CM

## 2022-08-15 DIAGNOSIS — R7303 Prediabetes: Secondary | ICD-10-CM

## 2022-08-15 MED ORDER — METFORMIN HCL 500 MG PO TABS
500.0000 mg | ORAL_TABLET | Freq: Every day | ORAL | 1 refills | Status: DC
Start: 2022-08-15 — End: 2023-02-13

## 2022-08-15 MED ORDER — SIMVASTATIN 40 MG PO TABS
40.0000 mg | ORAL_TABLET | Freq: Every day | ORAL | 1 refills | Status: DC
Start: 2022-08-15 — End: 2023-03-23

## 2022-08-15 NOTE — Telephone Encounter (Signed)
-----   Message from Vertis Kelch, [place] sent at 08/15/2022  4:24 PM EDT ----- Called patient reviewed all information and repeated back to me. Will call if any questions. Pt is agreeable to up dosing on cholesterol and metformin.

## 2022-08-15 NOTE — Telephone Encounter (Signed)
Sent in increased dose of simvastatin and started her on metformin 500mg  daily

## 2022-08-15 NOTE — Telephone Encounter (Signed)
Called pt and informed pt of this information.

## 2022-08-22 ENCOUNTER — Telehealth: Payer: Self-pay | Admitting: Nurse Practitioner

## 2022-08-22 NOTE — Telephone Encounter (Signed)
Patient was seen 08/05/22 in office has follow up on 02/09/23. Was started on Metformin 500mg  every day. Patient started on 08/16/22. By 08/18/22 patient started having GI symptoms. States that she is having loose stools and upset stomach several times a day every day. She is not able to tolerate at all. She has not been checking blood sugar at home to know if it has improved any.

## 2022-08-22 NOTE — Telephone Encounter (Signed)
  Patient called stating that metFORMIN (GLUCOPHAGE) 500 MG tablet is not working for her. She said that it has made her have an upset stomach and diarrhea.

## 2022-08-22 NOTE — Telephone Encounter (Signed)
Can we see if she is taking it with a meal. If not that is what I would advise as that can help offset the GI issues. If she is I am ok starting her on the extended release version to see if that side effects decrease

## 2022-08-22 NOTE — Telephone Encounter (Signed)
Called patient to discuss how she is taking the Metformin, with or without meal. Patient states "I am following all instructions ma'am." Patient also states that she would like to be taken off metformin and does not want the extended release. Patient would like to know if she could start a different medication.

## 2022-08-23 NOTE — Telephone Encounter (Signed)
Left voicemail for patient with Matts comments and also to call the office back.

## 2022-08-23 NOTE — Telephone Encounter (Signed)
Patient returned call,I relayed message to her.She said that Darlene Gonzalez already knows that her insurance will not cover the wegovy or zepbound. Then she said okay and hung up.

## 2022-08-23 NOTE — Telephone Encounter (Signed)
There is not a medication that we can do for pre-diabetes. The  weight loss injections are an option but I do not know if her insurance will cover either Wegovy or zepbound. She can call and ask. That is if she is ok with a once a week injection

## 2022-09-22 ENCOUNTER — Other Ambulatory Visit (HOSPITAL_COMMUNITY): Payer: Self-pay | Admitting: Orthopedic Surgery

## 2022-09-22 DIAGNOSIS — R609 Edema, unspecified: Secondary | ICD-10-CM

## 2022-09-23 ENCOUNTER — Ambulatory Visit (HOSPITAL_COMMUNITY)
Admission: RE | Admit: 2022-09-23 | Discharge: 2022-09-23 | Disposition: A | Discharge status: Home / Self Care | Payer: Managed Care, Other (non HMO) | Source: Ambulatory Visit | Attending: Orthopedic Surgery | Admitting: Orthopedic Surgery

## 2022-09-23 DIAGNOSIS — R609 Edema, unspecified: Secondary | ICD-10-CM | POA: Insufficient documentation

## 2022-09-23 NOTE — Progress Notes (Signed)
Lower extremity venous duplex completed. Please see CV Procedures for preliminary results.  Shona Simpson, RVT 09/23/22 3:05 PM

## 2022-10-05 ENCOUNTER — Other Ambulatory Visit: Payer: Self-pay | Admitting: Nurse Practitioner

## 2022-10-05 DIAGNOSIS — I10 Essential (primary) hypertension: Secondary | ICD-10-CM

## 2022-10-09 ENCOUNTER — Other Ambulatory Visit: Payer: Self-pay | Admitting: Acute Care

## 2022-10-09 DIAGNOSIS — Z122 Encounter for screening for malignant neoplasm of respiratory organs: Secondary | ICD-10-CM

## 2022-10-09 DIAGNOSIS — F17210 Nicotine dependence, cigarettes, uncomplicated: Secondary | ICD-10-CM

## 2022-10-09 DIAGNOSIS — Z87891 Personal history of nicotine dependence: Secondary | ICD-10-CM

## 2022-11-29 ENCOUNTER — Other Ambulatory Visit: Payer: Self-pay | Admitting: Nurse Practitioner

## 2022-11-29 DIAGNOSIS — I10 Essential (primary) hypertension: Secondary | ICD-10-CM

## 2022-11-30 ENCOUNTER — Other Ambulatory Visit: Payer: Self-pay | Admitting: Nurse Practitioner

## 2022-11-30 DIAGNOSIS — G4700 Insomnia, unspecified: Secondary | ICD-10-CM

## 2022-11-30 NOTE — Telephone Encounter (Signed)
LAST APPOINTMENT DATE: 08/05/22   NEXT APPOINTMENT DATE: 02/09/2023  Ambien 10mg    LAST REFILL: 08/05/22  QTY: #30 1RF

## 2022-12-21 ENCOUNTER — Telehealth: Payer: Self-pay | Admitting: Nurse Practitioner

## 2022-12-21 DIAGNOSIS — Z1231 Encounter for screening mammogram for malignant neoplasm of breast: Secondary | ICD-10-CM

## 2022-12-21 DIAGNOSIS — N63 Unspecified lump in unspecified breast: Secondary | ICD-10-CM

## 2022-12-21 NOTE — Telephone Encounter (Signed)
Pt called in and stated that [facility] told her that she needs to get her provider to put  in a referral for the imagine and ultrasound    [facility]  [address] [place], [place] [address]  Phone [phone] Fax [phone]

## 2022-12-21 NOTE — Telephone Encounter (Signed)
Orders have been placed.

## 2022-12-21 NOTE — Telephone Encounter (Signed)
Per last mammogram  1. Stable probably benign findings within the RIGHT breast. Recommend additional follow-up diagnostic mammogram and ultrasound in 12 months to ensure 2 year stability.

## 2022-12-21 NOTE — Telephone Encounter (Signed)
Contacted pt to inform her that referral and orders were placed for imaging at breast center. Pt verbalized understanding. Will call if any questions.

## 2022-12-26 ENCOUNTER — Ambulatory Visit
Admission: RE | Admit: 2022-12-26 | Discharge: 2022-12-26 | Disposition: A | Discharge status: Home / Self Care | Payer: Managed Care, Other (non HMO) | Source: Ambulatory Visit | Attending: Nurse Practitioner | Admitting: Nurse Practitioner

## 2022-12-26 DIAGNOSIS — Z87891 Personal history of nicotine dependence: Secondary | ICD-10-CM

## 2022-12-26 DIAGNOSIS — Z122 Encounter for screening for malignant neoplasm of respiratory organs: Secondary | ICD-10-CM

## 2022-12-26 DIAGNOSIS — F17210 Nicotine dependence, cigarettes, uncomplicated: Secondary | ICD-10-CM

## 2023-01-07 ENCOUNTER — Other Ambulatory Visit: Payer: Self-pay | Admitting: Nurse Practitioner

## 2023-01-07 DIAGNOSIS — I10 Essential (primary) hypertension: Secondary | ICD-10-CM

## 2023-01-12 DIAGNOSIS — T884XXA Failed or difficult intubation, initial encounter: Secondary | ICD-10-CM | POA: Insufficient documentation

## 2023-01-16 ENCOUNTER — Ambulatory Visit
Admission: RE | Admit: 2023-01-16 | Discharge: 2023-01-16 | Disposition: A | Discharge status: Home / Self Care | Payer: Managed Care, Other (non HMO) | Source: Ambulatory Visit | Attending: Nurse Practitioner | Admitting: Nurse Practitioner

## 2023-01-16 ENCOUNTER — Ambulatory Visit
Admission: RE | Admit: 2023-01-16 | Discharge: 2023-01-16 | Disposition: A | Discharge status: Home / Self Care | Payer: Managed Care, Other (non HMO) | Source: Ambulatory Visit | Attending: Nurse Practitioner

## 2023-01-16 DIAGNOSIS — N63 Unspecified lump in unspecified breast: Secondary | ICD-10-CM

## 2023-01-24 ENCOUNTER — Other Ambulatory Visit: Payer: Self-pay

## 2023-01-24 DIAGNOSIS — F17210 Nicotine dependence, cigarettes, uncomplicated: Secondary | ICD-10-CM

## 2023-01-24 DIAGNOSIS — Z122 Encounter for screening for malignant neoplasm of respiratory organs: Secondary | ICD-10-CM

## 2023-01-24 DIAGNOSIS — Z87891 Personal history of nicotine dependence: Secondary | ICD-10-CM

## 2023-01-26 ENCOUNTER — Other Ambulatory Visit: Payer: Self-pay | Admitting: Nurse Practitioner

## 2023-01-26 DIAGNOSIS — G4700 Insomnia, unspecified: Secondary | ICD-10-CM

## 2023-02-09 ENCOUNTER — Encounter: Payer: Managed Care, Other (non HMO) | Admitting: Nurse Practitioner

## 2023-02-13 ENCOUNTER — Encounter: Payer: Self-pay | Admitting: Nurse Practitioner

## 2023-02-13 ENCOUNTER — Ambulatory Visit (INDEPENDENT_AMBULATORY_CARE_PROVIDER_SITE_OTHER): Payer: Managed Care, Other (non HMO) | Admitting: Nurse Practitioner

## 2023-02-13 VITALS — BP 136/86 | HR 76 | Temp 97.9°F | Ht 69.25 in | Wt 250.4 lb

## 2023-02-13 DIAGNOSIS — K219 Gastro-esophageal reflux disease without esophagitis: Secondary | ICD-10-CM

## 2023-02-13 DIAGNOSIS — F17200 Nicotine dependence, unspecified, uncomplicated: Secondary | ICD-10-CM | POA: Diagnosis not present

## 2023-02-13 DIAGNOSIS — G4700 Insomnia, unspecified: Secondary | ICD-10-CM

## 2023-02-13 DIAGNOSIS — Z0000 Encounter for general adult medical examination without abnormal findings: Secondary | ICD-10-CM

## 2023-02-13 DIAGNOSIS — R7303 Prediabetes: Secondary | ICD-10-CM

## 2023-02-13 DIAGNOSIS — I700 Atherosclerosis of aorta: Secondary | ICD-10-CM | POA: Diagnosis not present

## 2023-02-13 DIAGNOSIS — I10 Essential (primary) hypertension: Secondary | ICD-10-CM

## 2023-02-13 LAB — POCT GLYCOSYLATED HEMOGLOBIN (HGB A1C): Hemoglobin A1C: 6.3 % — AB (ref 4.0–5.6)

## 2023-02-13 MED ORDER — ZOLPIDEM TARTRATE 10 MG PO TABS: 10.0000 mg | ORAL_TABLET | Freq: Every evening | ORAL | 2 refills | Status: DC | PRN

## 2023-02-13 NOTE — Patient Instructions (Signed)
Nice to see you today I will be in touch with the labs once I have reviewed them Follow up with me in 6 months, sooner if you need me

## 2023-02-13 NOTE — Assessment & Plan Note (Signed)
Patient currently maintained on Maxide, losartan.  Blood pressure well-controlled.  Continue taking medication as prescribed

## 2023-02-13 NOTE — Assessment & Plan Note (Signed)
History of the same.  A1c 6.3%.  Patient unable to tolerate metformin continue working healthy lifestyle modification

## 2023-02-13 NOTE — Progress Notes (Signed)
Established Patient Office Visit  Subjective   Patient ID: Darlene Gonzalez, female    DOB: May 22, 1960  Age: 62 y.o. MRN: [id number]  Chief Complaint  Patient presents with   Annual Exam      for complete physical and follow up of chronic conditions.   HTN: Patient currently maintained on losartan 100 mg and Maxide daily. Does not check her blood pressure at home   Pre-diabetes: Patient currently maintained on metformin 500 mg daily. States that she did not take the metformin long because of stomach upset   HLD: Patient currently maintained on simvastatin 40 mg daily  Insomnia: Patient currently maintained on Ambien 10 mg nightly as needed.   Immunizations: -Tetanus: Completed in 2022 -Influenza: up to date  -Shingles: up to date  -Pneumonia: Completed 2022  Diet: Fair diet. 2 meals  or smaller snacks. Water and soda  Exercise: No regular exercise. States  that she is walking more at work   Eye exam: PRN   Dental exam: implants evaluated as needed     Colonoscopy: Completed in 03/20/2014, repeat in 10 years Lung Cancer Screening: Completed in 01/24/2023, repeat in 12 months.  Patient has been evaluated by pulmonology.  Did notice advanced age aortic atherosclerosis and recommended cardiology referral  Pap smear: hystrectomy. States that she is needs to be seen every 3 years she is at threshold ambulatory referral to GYN per chart she had a supracervical hysterectomy  Mammogram: 01/16/2023  DEXA: Too young  Sleep: states that she will takes that she will take a nap after work and wake up around 7pm and then will take the ambein        Review of Systems  Constitutional:  Negative for chills and fever.  Respiratory:  Negative for shortness of breath.   Cardiovascular:  Negative for chest pain and leg swelling.  Gastrointestinal:  Negative for abdominal pain, blood in stool, constipation, diarrhea, nausea and vomiting.  Genitourinary:  Negative for dysuria and  hematuria.  Neurological:  Negative for tingling and headaches.  Psychiatric/Behavioral:  Negative for hallucinations and suicidal ideas.       Objective:     BP 136/86   Pulse 76   Temp 97.9 F (36.6 C) (Oral)   Ht 5' 9.25" (1.759 m)   Wt 250 lb 6.4 oz (113.6 kg)   SpO2 96%   BMI 36.71 kg/m  BP Readings from Last 3 Encounters:  02/13/23 136/86  08/05/22 130/70  06/01/22 136/84   Wt Readings from Last 3 Encounters:  02/13/23 250 lb 6.4 oz (113.6 kg)  08/05/22 252 lb 4 oz (114.4 kg)  06/01/22 251 lb 2 oz (113.9 kg)   SpO2 Readings from Last 3 Encounters:  02/13/23 96%  08/05/22 99%  06/01/22 99%      Physical Exam Vitals and nursing note reviewed.  Constitutional:      Appearance: Normal appearance.  HENT:     Right Ear: Tympanic membrane, ear canal and external ear normal.     Left Ear: Tympanic membrane, ear canal and external ear normal.     Mouth/Throat:     Mouth: Mucous membranes are moist.     Pharynx: Oropharynx is clear.  Eyes:     Extraocular Movements: Extraocular movements intact.     Pupils: Pupils are equal, round, and reactive to light.  Cardiovascular:     Rate and Rhythm: Normal rate and regular rhythm.     Pulses: Normal pulses.     Heart sounds:  Normal heart sounds.  Pulmonary:     Effort: Pulmonary effort is normal.     Breath sounds: Normal breath sounds.  Abdominal:     General: Bowel sounds are normal. There is no distension.     Palpations: There is no mass.     Tenderness: There is no abdominal tenderness.     Hernia: No hernia is present.  Musculoskeletal:     Right lower leg: No edema.     Left lower leg: No edema.  Lymphadenopathy:     Cervical: No cervical adenopathy.  Skin:    General: Skin is warm.  Neurological:     General: No focal deficit present.     Mental Status: She is alert.     Deep Tendon Reflexes:     Reflex Scores:      Bicep reflexes are 2+ on the right side and 2+ on the left side.      Patellar  reflexes are 2+ on the right side and 2+ on the left side.    Comments: Bilateral upper and lower extremity strength 5/5  Psychiatric:        Mood and Affect: Mood normal.        Behavior: Behavior normal.        Thought Content: Thought content normal.        Judgment: Judgment normal.      Results for orders placed or performed in visit on 02/13/23  POCT glycosylated hemoglobin (Hb A1C)  Result Value Ref Range   Hemoglobin A1C 6.3 (A) 4.0 - 5.6 %   HbA1c POC (<> result, manual entry)     HbA1c, POC (prediabetic range)     HbA1c, POC (controlled diabetic range)        The 10-year ASCVD risk score (Arnett DK, et al., 2019) is: 19.7%    Assessment & Plan:   Problem List Items Addressed This Visit       Cardiovascular and Mediastinum   Essential hypertension (Chronic)   Patient currently maintained on Maxide, losartan.  Blood pressure well-controlled.  Continue taking medication as prescribed      Relevant Orders   Ambulatory referral to Cardiology   CBC   Comprehensive metabolic panel   TSH     Digestive   GERD (Chronic)   Patient currently maintained on Protonix 40 mg daily from GI continue medication as prescribed follow-up with specialist as recommended        Other   TOBACCO ABUSE   Urine microscopy to rule out microscopic hematuria.      Relevant Orders   Urine Microscopic   Insomnia   Patient takes Ambien 10 mg nightly as needed.  Continue refill provided today      Relevant Medications   zolpidem (AMBIEN) 10 MG tablet   Preventative health care - Primary   Discussed age-appropriate immunizations and screening exams.  Did review patient's personal, surgical, social, family histories.  Patient is up-to-date on all age-appropriate vaccinations she would like.  Patient is up-to-date on CRC screening.  Breast cancer screening.  Ambulatory furl to GYN for cervical cancer screening.  Patient was given information at discharge about preventative healthcare  maintenance with anticipatory guidance.      Relevant Orders   Ambulatory referral to Gynecology   Pre-diabetes   History of the same.  A1c 6.3%.  Patient unable to tolerate metformin continue working healthy lifestyle modification      Relevant Orders   POCT glycosylated hemoglobin (Hb A1C) (Completed)  Other Visit Diagnoses       Aortic atherosclerosis (HCC)       Relevant Orders   Ambulatory referral to Cardiology   Lipid panel       Return in about 6 months (around 08/14/2023) for BP recheck, prediabetes A1C.    Audria Nine, NP

## 2023-02-13 NOTE — Assessment & Plan Note (Signed)
 Urine microscopy to rule out microscopic hematuria

## 2023-02-13 NOTE — Assessment & Plan Note (Signed)
Patient currently maintained on Protonix 40 mg daily from GI continue medication as prescribed follow-up with specialist as recommended

## 2023-02-13 NOTE — Assessment & Plan Note (Signed)
Patient takes Ambien 10 mg nightly as needed.  Continue refill provided today

## 2023-02-13 NOTE — Assessment & Plan Note (Signed)
Discussed age-appropriate immunizations and screening exams.  Did review patient's personal, surgical, social, family histories.  Patient is up-to-date on all age-appropriate vaccinations she would like.  Patient is up-to-date on CRC screening.  Breast cancer screening.  Ambulatory furl to GYN for cervical cancer screening.  Patient was given information at discharge about preventative healthcare maintenance with anticipatory guidance.

## 2023-02-14 LAB — URINALYSIS, MICROSCOPIC ONLY

## 2023-02-14 LAB — COMPREHENSIVE METABOLIC PANEL
ALT: 17 U/L (ref 0–35)
AST: 20 U/L (ref 0–37)
Albumin: 4.1 g/dL (ref 3.5–5.2)
Alkaline Phosphatase: 101 U/L (ref 39–117)
BUN: 11 mg/dL (ref 6–23)
CO2: 33 meq/L — ABNORMAL HIGH (ref 19–32)
Calcium: 9.1 mg/dL (ref 8.4–10.5)
Chloride: 99 meq/L (ref 96–112)
Creatinine, Ser: 0.88 mg/dL (ref 0.40–1.20)
GFR: 69.98 mL/min (ref >60.00)
Glucose, Bld: 99 mg/dL (ref 70–99)
Potassium: 3.9 meq/L (ref 3.5–5.1)
Sodium: 140 meq/L (ref 135–145)
Total Bilirubin: 0.7 mg/dL (ref 0.2–1.2)
Total Protein: 7.1 g/dL (ref 6.0–8.3)

## 2023-02-14 LAB — CBC
HCT: 41.1 % (ref 36.0–46.0)
Hemoglobin: 13.4 g/dL (ref 12.0–15.0)
MCHC: 32.7 g/dL (ref 30.0–36.0)
MCV: 92.3 fL (ref 78.0–100.0)
Platelets: 279 10*3/uL (ref 150.0–400.0)
RBC: 4.46 Mil/uL (ref 3.87–5.11)
RDW: 14.2 % (ref 11.5–15.5)
WBC: 7.8 10*3/uL (ref 4.0–10.5)

## 2023-02-14 LAB — LIPID PANEL
Cholesterol: 181 mg/dL (ref 0–200)
HDL: 45.9 mg/dL (ref >39.00)
LDL Cholesterol: 117 mg/dL — ABNORMAL HIGH (ref 0–99)
NonHDL: 134.71
Total CHOL/HDL Ratio: 4
Triglycerides: 89 mg/dL (ref 0.0–149.0)
VLDL: 17.8 mg/dL (ref 0.0–40.0)

## 2023-02-14 LAB — TSH: TSH: 2 u[IU]/mL (ref 0.35–5.50)

## 2023-02-16 ENCOUNTER — Telehealth: Payer: Self-pay

## 2023-02-16 DIAGNOSIS — R3129 Other microscopic hematuria: Secondary | ICD-10-CM

## 2023-02-16 NOTE — Telephone Encounter (Signed)
Copied from CRM [id number]. Topic: General - Other >> Feb 16, 2023 12:13 PM [place] A wrote: Reason for CRM: Patient was returning call from the nurse.

## 2023-02-16 NOTE — Telephone Encounter (Signed)
Called patient reviewed all information and repeated back to me. Pt states she has seen both urogynecology through [facility] and traditional urologist in the past.   Pt would prefer the [facility] location for urologist.

## 2023-02-17 NOTE — Addendum Note (Signed)
Addended by: Eden Emms on: 02/17/2023 01:07 PM   Modules accepted: Orders

## 2023-02-17 NOTE — Telephone Encounter (Signed)
Referral placed.

## 2023-02-27 ENCOUNTER — Ambulatory Visit: Payer: Managed Care, Other (non HMO) | Admitting: Internal Medicine

## 2023-02-27 ENCOUNTER — Encounter (HOSPITAL_BASED_OUTPATIENT_CLINIC_OR_DEPARTMENT_OTHER): Payer: Self-pay

## 2023-02-27 ENCOUNTER — Encounter (HOSPITAL_BASED_OUTPATIENT_CLINIC_OR_DEPARTMENT_OTHER): Payer: Managed Care, Other (non HMO) | Admitting: Certified Nurse Midwife

## 2023-03-02 ENCOUNTER — Telehealth: Payer: Self-pay | Admitting: *Deleted

## 2023-03-02 NOTE — Telephone Encounter (Signed)
Appointment notes updated.

## 2023-03-02 NOTE — Telephone Encounter (Signed)
 Patient returned call stating she has never seen cardiology.

## 2023-03-02 NOTE — Telephone Encounter (Signed)
 LMOVM to verify card hx.

## 2023-03-09 ENCOUNTER — Encounter: Payer: Self-pay | Admitting: Cardiology

## 2023-03-09 ENCOUNTER — Ambulatory Visit: Payer: Managed Care, Other (non HMO) | Attending: Cardiology | Admitting: Cardiology

## 2023-03-09 VITALS — BP 130/62 | HR 79 | Ht 71.0 in | Wt 256.6 lb

## 2023-03-09 DIAGNOSIS — E7800 Pure hypercholesterolemia, unspecified: Secondary | ICD-10-CM | POA: Diagnosis not present

## 2023-03-09 DIAGNOSIS — I10 Essential (primary) hypertension: Secondary | ICD-10-CM

## 2023-03-09 DIAGNOSIS — I2510 Atherosclerotic heart disease of native coronary artery without angina pectoris: Secondary | ICD-10-CM | POA: Diagnosis not present

## 2023-03-09 DIAGNOSIS — F17200 Nicotine dependence, unspecified, uncomplicated: Secondary | ICD-10-CM

## 2023-03-09 DIAGNOSIS — R55 Syncope and collapse: Secondary | ICD-10-CM

## 2023-03-09 MED ORDER — ASPIRIN 81 MG PO TBEC: 81.0000 mg | DELAYED_RELEASE_TABLET | Freq: Every day | ORAL | Status: AC

## 2023-03-09 NOTE — Progress Notes (Signed)
Cardiology Office Note:    Date:  03/09/2023   ID:  Darlene Gonzalez, DOB 1960-03-15, MRN [id number]  PCP:  Eden Emms, NP   [facility] Providers Cardiologist:  Debbe Odea, MD     Referring MD: Eden Emms, NP   Chief Complaint  Patient presents with   New Patient (Initial Visit)    Referred for cardiac evaluation of essential hypertension and aortic atherosclerosis with no personal cardiac history.     Darlene Gonzalez is a 63 y.o. female who is being seen today for the evaluation of aortic atherosclerosis.  At the request of Eden Emms, NP.   History of Present Illness:    Darlene Gonzalez is a 63 y.o. female with a hx of hypertension, hyperlipidemia, current smoker x 40+ years who presents due to aortic atherosclerosis.  Patient had a chest CT lung cancer screening 12/2022, aortic atherosclerosis and RCA calcifications noted.  Denies chest pain or shortness of breath, endorses chronic cough, presyncopal symptoms with coughing.  Currently smokes.  Did not tolerate order forms of statins.  Currently on simvastatin 40 mg daily, last lipid panel showed improved cholesterol levels.  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 Swallow whole.   cetirizine (ZYRTEC ALLERGY) 10 MG tablet Take 1 tablet (10 mg total) by mouth daily.   fluticasone (FLONASE) 50 MCG/ACT nasal spray Place 1 spray into both nostrils daily.   ibuprofen (ADVIL) 600 MG tablet Take 1 tablet (600 mg total) by mouth every 6 (six) hours as needed.   losartan (COZAAR) 100 MG tablet Take 1 tablet by mouth once daily   pantoprazole (PROTONIX) 40 MG tablet Take 1 tablet by mouth once daily   simvastatin (ZOCOR) 40 MG tablet Take 1 tablet (40 mg total) by mouth daily.   triamterene-hydrochlorothiazide (MAXZIDE-25) 37.5-25 MG tablet Take 1 tablet by mouth once daily   zolpidem (AMBIEN) 10 MG tablet Take 1 tablet (10 mg total) by mouth at bedtime as needed. for sleep     Allergies:   Crestor [rosuvastatin] and Lisinopril   Social History   Socioeconomic History   Marital status: Single    Spouse name: Not on file   Number of children: 0   Years of education: 12th grade   Highest education level: Not on file  Occupational History   Occupation: Holiday representative: [employer]   Occupation:  Deli    Employer: [employer]  Tobacco Use   Smoking status: Every Day    Current packs/day: 0.50    Average packs/day: 0.5 packs/day for 41.0 years (20.5 ttl pk-yrs)    Types: Cigarettes   Smokeless tobacco: Never  Vaping Use   Vaping status: Never Used  Substance and Sexual Activity   Alcohol use: Yes    Alcohol/week: 12.0 standard drinks of alcohol    Types: 12 Cans of beer per week    Comment: 1 twelve pack a weekend   Drug use: No   Sexual activity: Yes    Partners: Male    Birth control/protection: Surgical    Comment: hysterectomy  Other Topics Concern   Not on file  Social History Narrative   Lives by herself, has stable partner   Right handed   Social Drivers of Corporate investment banker Strain: Not on file  Food  Insecurity: Not on file  Transportation Needs: Not on file  Physical Activity: Not on file  Stress: Not on file  Social Connections: Not on file     Family History: The patient's family history includes Alcohol abuse in her father; Bladder Cancer in her sister; Diabetes in her maternal aunt; Healthy in her mother; Hypertension in her father; Kidney disease in her maternal aunt; Liver disease in her father; Lung cancer in her sister; Thyroid disease in her mother.  ROS:   Please see the history of present illness.     All other systems reviewed and are negative.  EKGs/Labs/Other Studies Reviewed:    The following studies were reviewed today:       Recent Labs: 02/13/2023: ALT 17; BUN 11; Creatinine, Ser 0.88; Hemoglobin 13.4; Platelets 279.0; Potassium 3.9; Sodium 140; TSH 2.00  Recent Lipid Panel    Component Value Date/Time   CHOL 181 02/13/2023 1530   TRIG 89.0 02/13/2023 1530   HDL 45.90 02/13/2023 1530   CHOLHDL 4 02/13/2023 1530   VLDL 17.8 02/13/2023 1530   LDLCALC 117 (H) 02/13/2023 1530   LDLCALC 160 (H) 01/29/2021 1532   LDLDIRECT 147 (H) 02/01/2014 0940     Risk Assessment/Calculations:             Physical Exam:    VS:  BP 130/62 (BP Location: Left Arm, Patient Position: Sitting, Cuff Size: Large)   Pulse 79   Ht 5\' 11"  (1.803 m)   Wt 256 lb 9.6 oz (116.4 kg)   SpO2 99%   BMI 35.79 kg/m     Wt Readings from Last 3 Encounters:  03/09/23 256 lb 9.6 oz (116.4 kg)  02/13/23 250 lb 6.4 oz (113.6 kg)  08/05/22 252 lb 4 oz (114.4 kg)     GEN:  Well nourished, well developed in no acute distress HEENT: Normal NECK: No JVD; No carotid bruits CARDIAC: RRR, no murmurs, rubs, gallops RESPIRATORY: Diminished breath sounds, no wheezing ABDOMEN: Soft, non-tender, non-distended MUSCULOSKELETAL:  No edema; No deformity  SKIN: Warm and dry NEUROLOGIC:  Alert and oriented x 3 PSYCHIATRIC:  Normal affect   ASSESSMENT:    1. Coronary artery disease  involving native coronary artery of native heart, unspecified whether angina present   2. Primary hypertension   3. Pure hypercholesterolemia   4. Smoking   5. Pre-syncope    PLAN:    In order of problems listed above:  CAD/RCA coronary calcification on chest CT 12/2022.  Aortic atherosclerosis also noted.  Cholesterol not controlled.  Start aspirin 81 mg daily, continue  simvastatin 40 mg daily.  Not tolerate Crestor in the past.  Obtain echocardiogram. Hypertension, BP controlled.  Continue losartan 100 mg daily, Maxzide-25 daily. Hyperlipidemia, simvastatin 40 mg daily. Current smoker, smoking cessation advised. Cough induced presyncope, smoking contributing.  Cessation of smoking advised.  Follow-up after echo.      Medication Adjustments/Labs and Tests Ordered: Current medicines are reviewed at length with the patient today.  Concerns regarding medicines are outlined above.  Orders Placed This Encounter  Procedures   EKG 12-Lead   ECHOCARDIOGRAM COMPLETE   Meds ordered this encounter  Medications   aspirin EC 81 MG tablet    Sig: Take 1 tablet (81 mg total) by mouth daily. Swallow whole.    Patient Instructions  Medication Instructions:   START Aspirin - Take one tablet ( 81mg ) by mouth daily.   *If you need a refill on your cardiac medications before your next appointment, please call your pharmacy*   Lab Work:  None Ordered  If you have labs (blood work) drawn today and your tests are completely normal, you will receive your results only by: MyChart Message (if you have MyChart) OR A paper copy in the mail If you have any lab test that is abnormal or we need to change your treatment, we will call you to review the results.   Testing/Procedures:  Your physician has requested that you have an echocardiogram. Echocardiography is a painless test that uses sound waves to create images of your heart. It provides your doctor with information about the size and  shape of your heart and how well your heart's chambers and valves are working. This procedure takes approximately one hour. There are no restrictions for this procedure. Please do NOT wear cologne, perfume, aftershave, or lotions (deodorant is allowed). Please arrive 15 minutes prior to your appointment time.  Please note: We ask at that you not bring children with you during ultrasound (echo/ vascular) testing. Due to room size and safety concerns, children are not allowed in the ultrasound rooms during exams. Our front office staff cannot provide observation of children in our lobby area while testing is being conducted. An adult accompanying a patient to their appointment will only be allowed in the ultrasound room at the discretion of the ultrasound technician under special circumstances. We apologize for any inconvenience.    Follow-Up: At [facility], you and your health needs are our priority.  As part of our continuing mission to provide you with exceptional heart care, we have created designated Provider Care Teams.  These Care Teams include your primary Cardiologist (physician) and Advanced Practice Providers (APPs -  Physician Assistants and Nurse Practitioners) who all work together to provide you with the care you need, when you need it.  We recommend signing up for the patient portal called "MyChart".  Sign up information is provided on this After Visit Summary.  MyChart is used to connect with patients for Virtual Visits (Telemedicine).  Patients are able to view lab/test results, encounter notes, upcoming appointments, etc.  Non-urgent messages can be sent to your provider as well.   To learn more about what you can do with MyChart, go to [email or web address].    Your next appointment:    After Echocardiogram   Provider:   You may see Debbe Odea, MD or one of the following Advanced Practice Providers on your designated Care Team:   Nicolasa Ducking, NP Eula Listen, PA-C Cadence Fransico Michael, PA-C Charlsie Quest, NP'   Signed, Debbe Odea,  MD  03/09/2023 12:48 PM    [facility]

## 2023-03-09 NOTE — Patient Instructions (Signed)
Medication Instructions:   START Aspirin - Take one tablet ( 81mg ) by mouth daily.   *If you need a refill on your cardiac medications before your next appointment, please call your pharmacy*   Lab Work:  None Ordered  If you have labs (blood work) drawn today and your tests are completely normal, you will receive your results only by: MyChart Message (if you have MyChart) OR A paper copy in the mail If you have any lab test that is abnormal or we need to change your treatment, we will call you to review the results.   Testing/Procedures:  Your physician has requested that you have an echocardiogram. Echocardiography is a painless test that uses sound waves to create images of your heart. It provides your doctor with information about the size and shape of your heart and how well your heart's chambers and valves are working. This procedure takes approximately one hour. There are no restrictions for this procedure. Please do NOT wear cologne, perfume, aftershave, or lotions (deodorant is allowed). Please arrive 15 minutes prior to your appointment time.  Please note: We ask at that you not bring children with you during ultrasound (echo/ vascular) testing. Due to room size and safety concerns, children are not allowed in the ultrasound rooms during exams. Our front office staff cannot provide observation of children in our lobby area while testing is being conducted. An adult accompanying a patient to their appointment will only be allowed in the ultrasound room at the discretion of the ultrasound technician under special circumstances. We apologize for any inconvenience.    Follow-Up: At [facility], you and your health needs are our priority.  As part of our continuing mission to provide you with exceptional heart care, we have created designated Provider Care Teams.  These Care Teams include your primary Cardiologist (physician) and Advanced Practice Providers (APPs -   Physician Assistants and Nurse Practitioners) who all work together to provide you with the care you need, when you need it.  We recommend signing up for the patient portal called "MyChart".  Sign up information is provided on this After Visit Summary.  MyChart is used to connect with patients for Virtual Visits (Telemedicine).  Patients are able to view lab/test results, encounter notes, upcoming appointments, etc.  Non-urgent messages can be sent to your provider as well.   To learn more about what you can do with MyChart, go to [email or web address].    Your next appointment:    After Echocardiogram   Provider:   You may see Debbe Odea, MD or one of the following Advanced Practice Providers on your designated Care Team:   Nicolasa Ducking, NP Eula Listen, PA-C Cadence Fransico Michael, PA-C Charlsie Quest, NP'

## 2023-03-20 ENCOUNTER — Encounter (HOSPITAL_BASED_OUTPATIENT_CLINIC_OR_DEPARTMENT_OTHER): Payer: Self-pay

## 2023-03-20 ENCOUNTER — Ambulatory Visit (HOSPITAL_BASED_OUTPATIENT_CLINIC_OR_DEPARTMENT_OTHER): Payer: Managed Care, Other (non HMO)

## 2023-03-20 ENCOUNTER — Encounter (HOSPITAL_BASED_OUTPATIENT_CLINIC_OR_DEPARTMENT_OTHER): Payer: Self-pay | Admitting: Certified Nurse Midwife

## 2023-03-20 ENCOUNTER — Ambulatory Visit (HOSPITAL_BASED_OUTPATIENT_CLINIC_OR_DEPARTMENT_OTHER): Payer: Managed Care, Other (non HMO) | Admitting: Student

## 2023-03-20 ENCOUNTER — Encounter (HOSPITAL_BASED_OUTPATIENT_CLINIC_OR_DEPARTMENT_OTHER): Payer: Self-pay | Admitting: Student

## 2023-03-20 ENCOUNTER — Ambulatory Visit (HOSPITAL_BASED_OUTPATIENT_CLINIC_OR_DEPARTMENT_OTHER): Payer: Managed Care, Other (non HMO) | Admitting: Certified Nurse Midwife

## 2023-03-20 ENCOUNTER — Other Ambulatory Visit (HOSPITAL_COMMUNITY)
Admission: RE | Admit: 2023-03-20 | Discharge: 2023-03-20 | Disposition: A | Discharge status: Home / Self Care | Payer: Managed Care, Other (non HMO) | Source: Ambulatory Visit | Attending: Certified Nurse Midwife | Admitting: Certified Nurse Midwife

## 2023-03-20 VITALS — BP 150/90 | HR 79 | Ht 71.0 in | Wt 251.2 lb

## 2023-03-20 DIAGNOSIS — Z124 Encounter for screening for malignant neoplasm of cervix: Secondary | ICD-10-CM

## 2023-03-20 DIAGNOSIS — M25562 Pain in left knee: Secondary | ICD-10-CM

## 2023-03-20 DIAGNOSIS — R7309 Other abnormal glucose: Secondary | ICD-10-CM | POA: Diagnosis not present

## 2023-03-20 DIAGNOSIS — M25561 Pain in right knee: Secondary | ICD-10-CM

## 2023-03-20 DIAGNOSIS — L900 Lichen sclerosus et atrophicus: Secondary | ICD-10-CM

## 2023-03-20 DIAGNOSIS — B372 Candidiasis of skin and nail: Secondary | ICD-10-CM

## 2023-03-20 DIAGNOSIS — Z780 Asymptomatic menopausal state: Secondary | ICD-10-CM

## 2023-03-20 DIAGNOSIS — Z113 Encounter for screening for infections with a predominantly sexual mode of transmission: Secondary | ICD-10-CM

## 2023-03-20 DIAGNOSIS — R232 Flushing: Secondary | ICD-10-CM

## 2023-03-20 MED ORDER — FLUCONAZOLE 150 MG PO TABS: 150.0000 mg | ORAL_TABLET | ORAL | 4 refills | Status: DC | PRN

## 2023-03-20 MED ORDER — CLOBETASOL PROPIONATE 0.05 % EX OINT: 1.0000 | TOPICAL_OINTMENT | Freq: Two times a day (BID) | CUTANEOUS | 5 refills | Status: DC

## 2023-03-20 MED ORDER — MICONAZOLE NITRATE 2 % POWD: 1.0000 | Freq: Three times a day (TID) | 3 refills | Status: DC | PRN

## 2023-03-20 NOTE — Progress Notes (Signed)
Patient unable to stay for visit due to transportation time constraints.  Will try to walk in for visit tomorrow.

## 2023-03-20 NOTE — Progress Notes (Signed)
63 y.o. [id number] Single Black or [place] American female here for annual exam. States she recently had InterStim that became infected and had to be removed. States she has been on antibiotics for several weeks and thinks she has developed a vaginal yeast infection.   No LMP recorded. Patient has had a hysterectomy.  (Supracervical hysterectomy, ovaries intact, Dr. Fransisco Hertz, fibroids)        Sexually active: Yes.   Uses condoms 100% for STD prevention. Would like routine STD screening.  The current method of family planning is status post hysterectomy.     The pregnancy intention screening data noted above was reviewed. Potential methods of contraception were discussed. The patient elected to proceed with No data recorded.  Exercising: Yes.     Smoker:  yes  Health Maintenance: Pap:  Last pap 02/18/19 Neg History of abnormal Pap:  no MMG:  UTD (01/16/23): No evidence malignancy. Benign mass right 8:30 complicated cyst. F/U 1 year Colonoscopy:  UTD, Pt planning Colonoscopy 2025 BMD: Plan BMD at age 51 PCP: Dr Mordecai Maes  Lung Cancer Screening: 12/2022 (smoker)   reports that she has been smoking cigarettes. She has a 20.5 pack-year smoking history. She has never used smokeless tobacco. She reports current alcohol use of about 12.0 standard drinks of alcohol per week. She reports that she does not use drugs.  Past Medical History:  Diagnosis Date   Adenomatous colon polyp    Anemia    Chronic headaches    Diverticulosis    Esophageal stricture    Fluid in knee    Bilateral   GERD (gastroesophageal reflux disease)    Hemorrhagic ovarian cyst 01/2006   Right   Hemorrhoids    Hiatal hernia    HTN (hypertension)    Hyperplastic colon polyp    Hypokalemia 06/2005   2nd to diuretic    Osteoarthritis, knee    Bilateral   Pneumonia    Pyelonephritis 06/2005   Uncomplicated   Recurrent boils    Underarms   Tobacco abuse    UTI (lower urinary tract infection)    Vulvar atrophy  12/2006    Past Surgical History:  Procedure Laterality Date   ABDOMINAL HYSTERECTOMY  1998   Dr Clearance Coots for fibroids (supracervical)   BREAST BIOPSY     BREAST CYST EXCISION Right     Current Outpatient Medications  Medication Sig Dispense Refill   aspirin EC 81 MG tablet Take 1 tablet (81 mg total) by mouth daily. Swallow whole.     cetirizine (ZYRTEC ALLERGY) 10 MG tablet Take 1 tablet (10 mg total) by mouth daily. 14 tablet 0   clobetasol ointment (TEMOVATE) 0.05 % Apply 1 Application topically 2 (two) times daily. Apply as directed twice daily for 2 weeks then twice weekly 60 g 5   fluconazole (DIFLUCAN) 150 MG tablet Take 1 tablet (150 mg total) by mouth every other day as needed. 2 tablet 4   fluticasone (FLONASE) 50 MCG/ACT nasal spray Place 1 spray into both nostrils daily. 16 g 0   ibuprofen (ADVIL) 600 MG tablet Take 1 tablet (600 mg total) by mouth every 6 (six) hours as needed. 30 tablet 0   losartan (COZAAR) 100 MG tablet Take 1 tablet by mouth once daily 90 tablet 1   Miconazole Nitrate 2 % POWD 1 Application by Does not apply route 3 (three) times daily as needed. Apply under breasts three times daily as needed 500 g 3   pantoprazole (PROTONIX) 40 MG  tablet Take 1 tablet by mouth once daily 90 tablet 3   simvastatin (ZOCOR) 40 MG tablet Take 1 tablet (40 mg total) by mouth daily. 90 tablet 1   triamterene-hydrochlorothiazide (MAXZIDE-25) 37.5-25 MG tablet Take 1 tablet by mouth once daily 90 tablet 1   zolpidem (AMBIEN) 10 MG tablet Take 1 tablet (10 mg total) by mouth at bedtime as needed. for sleep 30 tablet 2   No current facility-administered medications for this visit.    Family History  Problem Relation Age of Onset   Healthy Mother        Living   Thyroid disease Mother    Hypertension Father        Deceased   Liver disease Father        Cirrhosis   Alcohol abuse Father    Lung cancer Sister    Bladder Cancer Sister    Kidney disease Maternal Aunt     Diabetes Maternal Aunt        x2    ROS: Constitutional: negative Genitourinary:positive for hot flashes and internal vaginal itching (thinks she has vaginal yeast related to recent antibiotic use). Hx Lichen Sclerosus vulva. Not using Temovate currently. Having flare of psoriasis both elbows. Severe yeast infection under both breasts.   Exam:   BP (!) 150/90 (BP Location: Right Arm, Patient Position: Sitting, Cuff Size: Normal)   Pulse 79   Ht 5\' 11"  (1.803 m)   Wt 251 lb 3.2 oz (113.9 kg)   BMI 35.04 kg/m   Height: 5\' 11"  (180.3 cm)  General appearance: alert, cooperative and appears stated age Head: Normocephalic, without obvious abnormality, atraumatic Heart: regular rate and rhythm Abdomen: soft, non-tender; bowel sounds normal; no masses,  no organomegaly Extremities: extremities normal, atraumatic, no cyanosis or edema Skin: Skin color, texture, turgor normal. No rashes or lesions Lymph nodes: Cervical, supraclavicular, and axillary nodes normal. No abnormal inguinal nodes palpated Neurologic: Grossly normal  Pelvic: External genitalia:  Externally there are skin changes consistent with moderate-severe Lichen Sclerosus (white plaques in hour-glass pattern)              Urethra:  normal appearing urethra with no masses, tenderness or lesions              Bartholins and Skenes: normal                 Vagina: normal appearing vagina with normal color and no discharge, no lesions              Cervix: no bleeding following Pap and no cervical motion tenderness              Pap taken: Yes.   Bimanual Exam:  Uterus:  uterus absent              Adnexa: no mass, fullness, tenderness               Rectovaginal: Confirms               Anus:  normal sphincter tone, no lesions  Chaperone, Hendricks Milo, CMA, was present for exam.  Assessment/Plan:  1. Cervical cancer screening (Primary) - Supracervical hysterectomy for benign disease - Cytology - PAP( [facility])  2.  Postmenopausal - +hot flashes, night sweats - VITAMIN D 25 Hydroxy (Vit-D Deficiency, Fractures) - B12  3. Hot flashes - VITAMIN D 25 Hydroxy (Vit-D Deficiency, Fractures) - B12  4. Elevated hemoglobin A1c - Hemoglobin A1c  5. Candidal skin infection (Intertrigo) -  Will apply Micronazole powder after showering and up to TID prn under both breasts - Miconazole Nitrate 2 % POWD; 1 Application by Does not apply route 3 (three) times daily as needed. Apply under breasts three times daily as needed  Dispense: 500 g; Refill: 3  6. Lichen sclerosus - discussed Lichen Sclerosus and potential for flares - clobetasol ointment (TEMOVATE) 0.05 %; Apply 1 Application topically 2 (two) times daily. Apply as directed twice daily for 2 weeks then twice weekly  Dispense: 60 g; Refill: 5   RTO 1 year for annual gyn exam and prn if issues arise. Pt desired Vitamin D and Vitamin  b12 labs today.  Letta Kocher

## 2023-03-21 ENCOUNTER — Other Ambulatory Visit (HOSPITAL_BASED_OUTPATIENT_CLINIC_OR_DEPARTMENT_OTHER): Payer: Self-pay | Admitting: Obstetrics & Gynecology

## 2023-03-21 ENCOUNTER — Other Ambulatory Visit: Payer: Self-pay

## 2023-03-21 ENCOUNTER — Encounter (HOSPITAL_BASED_OUTPATIENT_CLINIC_OR_DEPARTMENT_OTHER): Payer: Self-pay | Admitting: Certified Nurse Midwife

## 2023-03-21 ENCOUNTER — Other Ambulatory Visit: Payer: Self-pay | Admitting: Nurse Practitioner

## 2023-03-21 ENCOUNTER — Other Ambulatory Visit (HOSPITAL_BASED_OUTPATIENT_CLINIC_OR_DEPARTMENT_OTHER): Payer: Self-pay | Admitting: Certified Nurse Midwife

## 2023-03-21 DIAGNOSIS — E785 Hyperlipidemia, unspecified: Secondary | ICD-10-CM

## 2023-03-21 DIAGNOSIS — I10 Essential (primary) hypertension: Secondary | ICD-10-CM

## 2023-03-21 DIAGNOSIS — L900 Lichen sclerosus et atrophicus: Secondary | ICD-10-CM

## 2023-03-21 DIAGNOSIS — R7989 Other specified abnormal findings of blood chemistry: Secondary | ICD-10-CM

## 2023-03-21 LAB — HEMOGLOBIN A1C
Est. average glucose Bld gHb Est-mCnc: 140 mg/dL
Hgb A1c MFr Bld: 6.5 % — ABNORMAL HIGH (ref 4.8–5.6)

## 2023-03-21 LAB — RPR: RPR Ser Ql: NONREACTIVE

## 2023-03-21 LAB — VITAMIN D 25 HYDROXY (VIT D DEFICIENCY, FRACTURES): Vit D, 25-Hydroxy: 15.1 ng/mL — ABNORMAL LOW (ref 30.0–100.0)

## 2023-03-21 LAB — HIV ANTIBODY (ROUTINE TESTING W REFLEX): HIV Screen 4th Generation wRfx: NONREACTIVE

## 2023-03-21 LAB — VITAMIN B12: Vitamin B-12: 804 pg/mL (ref 232–1245)

## 2023-03-21 LAB — HEPATITIS C ANTIBODY: Hep C Virus Ab: NONREACTIVE

## 2023-03-21 LAB — HEPATITIS B SURFACE ANTIGEN: Hepatitis B Surface Ag: NEGATIVE

## 2023-03-21 MED ORDER — CLOBETASOL PROPIONATE 0.05 % EX OINT: TOPICAL_OINTMENT | CUTANEOUS | 3 refills | Status: AC

## 2023-03-21 MED ORDER — VITAMIN D (ERGOCALCIFEROL) 1.25 MG (50000 UNIT) PO CAPS: 50000.0000 [IU] | ORAL_CAPSULE | ORAL | 3 refills | Status: AC

## 2023-03-21 MED ORDER — TRIAMTERENE-HCTZ 37.5-25 MG PO TABS: 1.0000 | ORAL_TABLET | Freq: Every day | ORAL | 1 refills | Status: DC

## 2023-03-21 NOTE — Telephone Encounter (Signed)
Refill request received for Maxzide from mail order.  Last sent to local pharmacy 01/10/23 Cpe 02/13/23 F/u 08/14/23

## 2023-03-22 ENCOUNTER — Ambulatory Visit: Payer: Managed Care, Other (non HMO) | Attending: Cardiology

## 2023-03-22 DIAGNOSIS — I2510 Atherosclerotic heart disease of native coronary artery without angina pectoris: Secondary | ICD-10-CM

## 2023-03-22 MED ORDER — PERFLUTREN LIPID MICROSPHERE
1.0000 mL | INTRAVENOUS | Status: AC | PRN
  Administered 2023-03-22: 3 mL via INTRAVENOUS

## 2023-03-23 ENCOUNTER — Telehealth: Payer: Self-pay | Admitting: Nurse Practitioner

## 2023-03-23 ENCOUNTER — Other Ambulatory Visit: Payer: Self-pay | Admitting: Nurse Practitioner

## 2023-03-23 DIAGNOSIS — E785 Hyperlipidemia, unspecified: Secondary | ICD-10-CM

## 2023-03-23 LAB — ECHOCARDIOGRAM COMPLETE
AV Mean grad: 6 mm[Hg]
AV Peak grad: 12.1 mm[Hg]
Ao pk vel: 1.74 m/s
Area-P 1/2: 2.69 cm2
S' Lateral: 3 cm

## 2023-03-23 MED ORDER — SIMVASTATIN 40 MG PO TABS: 40.0000 mg | ORAL_TABLET | Freq: Every day | ORAL | 1 refills | Status: DC

## 2023-03-23 NOTE — Telephone Encounter (Signed)
Copied from CRM [id number]. Topic: Clinical - Prescription Issue >> Mar 23, 2023  1:51 PM Florestine Avers wrote: Reason for CRM: Patient called in stating that she called in prior for a refill on her simvastatin (ZOCOR) 40 MG tablet. Patient is completely out of her medication and there is a pending order.

## 2023-03-23 NOTE — Telephone Encounter (Signed)
Can we reach out and see if she is willing to speak to a nutritionist. Also she will need a 3 month follow up with me in office to discuss her A1C and to recheck it

## 2023-03-23 NOTE — Telephone Encounter (Signed)
Copied from CRM [id number]. Topic: Clinical - Medication Refill >> Mar 23, 2023  1:50 PM Florestine Avers wrote: Most Recent Primary Care Visit:  Provider: Eden Emms  Department: [facility]  Visit Type: OFFICE VISIT  Date: 02/13/2023  Medication: simvastatin (ZOCOR) 40 MG tablet  Has the patient contacted their pharmacy? Yes (Agent: If no, request that the patient contact the pharmacy for the refill. If patient does not wish to contact the pharmacy document the reason why and proceed with request.) (Agent: If yes, when and what did the pharmacy advise?)  Is this the correct pharmacy for this prescription? Yes If no, delete pharmacy and type the correct one.  This is the patient's preferred pharmacy:  [facility] [address] [place], [place] - [address] [place] [address] Phone: [phone] Fax: [phone]   Has the prescription been filled recently? No  Is the patient out of the medication? Yes  Has the patient been seen for an appointment in the last year OR does the patient have an upcoming appointment? Yes  Can we respond through MyChart? Yes  Agent: Please be advised that Rx refills may take up to 3 business days. We ask that you follow-up with your pharmacy.

## 2023-03-23 NOTE — Telephone Encounter (Signed)
-----   Message from Letta Kocher sent at 03/21/2023  9:17 AM EST ----- Dr. Toney Reil, Hart Carwin was here in our office and wanted labs drawn so I repeated her HgA1C. Sending it to your for your review. Letta Kocher

## 2023-03-24 ENCOUNTER — Encounter: Payer: Managed Care, Other (non HMO) | Admitting: Nurse Practitioner

## 2023-03-24 ENCOUNTER — Other Ambulatory Visit: Payer: Self-pay | Admitting: Nurse Practitioner

## 2023-03-24 LAB — CYTOLOGY - PAP
Adequacy: ABSENT
Chlamydia: NEGATIVE
Comment: NEGATIVE
Comment: NEGATIVE
Comment: NEGATIVE
Comment: NORMAL
Diagnosis: NEGATIVE
High risk HPV: NEGATIVE
Neisseria Gonorrhea: NEGATIVE
Trichomonas: NEGATIVE

## 2023-03-24 NOTE — Telephone Encounter (Signed)
Spoke to pt. Pharmacy had told her that [facility] had denied the simvastatin. I advised that he filled it yesterday.  Canceled the 03-31-23 OV and r/s to 06-22-23 for 3 month f/u

## 2023-03-24 NOTE — Telephone Encounter (Signed)
If she is tolerating the medication I want her to continue it

## 2023-03-24 NOTE — Telephone Encounter (Signed)
Left message on VM for pt to let us know if she wanted to see a nutritionist. Called her back and left a message to r/s the appt that was made for 03/31/23 as that is too soon. It needs to be on or after April 27.

## 2023-03-24 NOTE — Telephone Encounter (Signed)
Copied from CRM [id number]. Topic: General - Other >> Mar 24, 2023 12:44 PM Denese Killings wrote: Reason for CRM: Patient is returning a call and stated that she does not want to see a nutritionist. Patient also wants to know if doctor wants her to stop taking simvastatin (ZOCOR) 40 MG tablet.

## 2023-03-27 ENCOUNTER — Telehealth: Payer: Self-pay

## 2023-03-27 DIAGNOSIS — E785 Hyperlipidemia, unspecified: Secondary | ICD-10-CM

## 2023-03-27 DIAGNOSIS — G4700 Insomnia, unspecified: Secondary | ICD-10-CM

## 2023-03-27 DIAGNOSIS — I10 Essential (primary) hypertension: Secondary | ICD-10-CM

## 2023-03-27 MED ORDER — SIMVASTATIN 40 MG PO TABS: 40.0000 mg | ORAL_TABLET | Freq: Every day | ORAL | 1 refills | Status: DC

## 2023-03-27 MED ORDER — ZOLPIDEM TARTRATE 10 MG PO TABS: 10.0000 mg | ORAL_TABLET | Freq: Every evening | ORAL | 2 refills | Status: DC | PRN

## 2023-03-27 MED ORDER — TRIAMTERENE-HCTZ 37.5-25 MG PO TABS: 1.0000 | ORAL_TABLET | Freq: Every day | ORAL | 1 refills | Status: DC

## 2023-03-27 MED ORDER — LOSARTAN POTASSIUM 100 MG PO TABS: 100.0000 mg | ORAL_TABLET | Freq: Every day | ORAL | 1 refills | Status: DC

## 2023-03-27 NOTE — Addendum Note (Signed)
Addended by: Eden Emms on: 03/27/2023 01:39 PM   Modules accepted: Orders

## 2023-03-27 NOTE — Telephone Encounter (Signed)
Pt requests 90 day supply to [facility] for all 4 prescriptions listed below  LAST APPOINTMENT DATE: 02/13/2023 NEXT APPOINTMENT DATE: 06/22/2023  Losartan 100MG  LAST REFILL: 03/23/23 QTY: 90 #1RF  Triamterene-Hydrochlorothiazide 25mg  LAST REFILL: 03/21/23 QTY: 90 1RF  Simvastatin 40mg   LAST REFILL: 03/23/23 QTY: 90 1RF  Ambien 10 MG LAST REFILL: 02/13/23 QTY: #30 2RF

## 2023-03-27 NOTE — Telephone Encounter (Signed)
Had a supply sent in for all but the Ambien.  Ambien was sent with 30 days with 2 refills

## 2023-03-28 NOTE — Telephone Encounter (Signed)
Called patient and let her know will call if any further questions.

## 2023-03-31 ENCOUNTER — Ambulatory Visit: Payer: Managed Care, Other (non HMO) | Admitting: Nurse Practitioner

## 2023-04-28 ENCOUNTER — Ambulatory Visit: Payer: Managed Care, Other (non HMO) | Attending: Cardiology | Admitting: Cardiology

## 2023-04-28 ENCOUNTER — Encounter: Payer: Self-pay | Admitting: Cardiology

## 2023-04-28 VITALS — BP 151/87 | HR 71 | Ht 70.0 in | Wt 250.2 lb

## 2023-04-28 DIAGNOSIS — E7800 Pure hypercholesterolemia, unspecified: Secondary | ICD-10-CM

## 2023-04-28 DIAGNOSIS — I10 Essential (primary) hypertension: Secondary | ICD-10-CM

## 2023-04-28 DIAGNOSIS — I8393 Asymptomatic varicose veins of bilateral lower extremities: Secondary | ICD-10-CM

## 2023-04-28 DIAGNOSIS — F17200 Nicotine dependence, unspecified, uncomplicated: Secondary | ICD-10-CM

## 2023-04-28 DIAGNOSIS — I2510 Atherosclerotic heart disease of native coronary artery without angina pectoris: Secondary | ICD-10-CM

## 2023-04-28 NOTE — Patient Instructions (Signed)
 Medication Instructions:  Your Physician recommend you continue on your current medication as directed.    *If you need a refill on your cardiac medications before your next appointment, please call your pharmacy*   Lab Work: None ordered at this time    Follow-Up: At [facility], you and your health needs are our priority.  As part of our continuing mission to provide you with exceptional heart care, we have created designated Provider Care Teams.  These Care Teams include your primary Cardiologist (physician) and Advanced Practice Providers (APPs -  Physician Assistants and Nurse Practitioners) who all work together to provide you with the care you need, when you need it.   Your next appointment:   6 month(s)  Provider:   You may see Debbe Odea, MD or one of the following Advanced Practice Providers on your designated Care Team:   Nicolasa Ducking, NP Eula Listen, PA-C Cadence Fransico Michael, PA-C Charlsie Quest, NP Carlos Levering, NP

## 2023-04-28 NOTE — Progress Notes (Signed)
 Cardiology Office Note:    Date:  04/28/2023   ID:  Darlene Gonzalez, DOB 12-Sep-1960, MRN [id number]  PCP:  Eden Emms, NP   [facility] Providers Cardiologist:  Debbe Odea, MD     Referring MD: Eden Emms, NP   Chief Complaint  Patient presents with   Follow-up    Following up post echocardiogram. Patient is doing well on today. Meds reviewed.     History of Present Illness:    Darlene Gonzalez is a 63 y.o. female with a hx of CAD (RCA calcifications on chest CT ), hypertension, hyperlipidemia, current smoker x 40+ years who presents for follow-up.  Previously seen due to CAD/coronary calcifications.  Echocardiogram was obtained to evaluate any significant structural abnormalities.  She endorsed having leg edema, also discoloration around ankles.  She still smokes, working on quitting.  BP at home is typically controlled, was rushing to today's visit.  Also dealing with some stressors at home which may be causing BP to be elevated.  Prior notes/testing Patient had a chest CT lung cancer screening 12/2022, aortic atherosclerosis and RCA calcifications noted. Only tolerate simvastatin.  Did not tolerate order forms of statins.  Past Medical History:  Diagnosis Date   Adenomatous colon polyp    Anemia    Chronic headaches    Diverticulosis    Esophageal stricture    Fluid in knee    Bilateral   GERD (gastroesophageal reflux disease)    Hemorrhagic ovarian cyst 01/2006   Right   Hemorrhoids    Hiatal hernia    HTN (hypertension)    Hyperplastic colon polyp    Hypokalemia 06/2005   2nd to diuretic    Osteoarthritis, knee    Bilateral   Pneumonia    Pyelonephritis 06/2005   Uncomplicated   Recurrent boils    Underarms   Tobacco abuse    UTI (lower urinary tract infection)    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    Past Surgical History:  Procedure Laterality Date   ABDOMINAL HYSTERECTOMY  1998   Dr Clearance Coots for fibroids (supracervical)   BREAST BIOPSY      BREAST CYST EXCISION Right     Current Medications: Current Meds  Medication Sig   aspirin EC 81 MG tablet Take 1 tablet (81 mg total) by mouth daily. Swallow whole.   cetirizine (ZYRTEC ALLERGY) 10 MG tablet Take 1 tablet (10 mg total) by mouth daily.   clobetasol ointment (TEMOVATE) 0.05 % Apply 0.5 grams to the vulvar skin twice daily for 2 weeks for 1 month and then 0.5 grams daily for another 1 months.  Then apply 0.5 grams topically twice weekly.   fluconazole (DIFLUCAN) 150 MG tablet Take 1 tablet (150 mg total) by mouth every other day as needed.   fluticasone (FLONASE) 50 MCG/ACT nasal spray Place 1 spray into both nostrils daily.   ibuprofen (ADVIL) 600 MG tablet Take 1 tablet (600 mg total) by mouth every 6 (six) hours as needed.   losartan (COZAAR) 100 MG tablet Take 1 tablet (100 mg total) by mouth daily.   Miconazole Nitrate 2 % POWD 1 Application by Does not apply route 3 (three) times daily as needed. Apply under breasts three times daily as needed   pantoprazole (PROTONIX) 40 MG tablet Take 1 tablet by mouth once daily   simvastatin (ZOCOR) 40 MG tablet Take 1 tablet (40 mg total) by mouth daily.   triamterene-hydrochlorothiazide (MAXZIDE-25) 37.5-25 MG tablet Take 1 tablet by mouth  daily.   Vitamin D, Ergocalciferol, (DRISDOL) 1.25 MG (50000 UNIT) CAPS capsule Take 1 capsule (50,000 Units total) by mouth every 7 (seven) days.   zolpidem (AMBIEN) 10 MG tablet Take 1 tablet (10 mg total) by mouth at bedtime as needed. for sleep     Allergies:   Crestor [rosuvastatin] and Lisinopril   Social History   Socioeconomic History   Marital status: Single    Spouse name: Not on file   Number of children: 0   Years of education: 12th grade   Highest education level: Not on file  Occupational History   Occupation: Holiday representative: [employer]   Occupation: Deli    Employer: [employer]  Tobacco Use   Smoking status: Every Day    Current packs/day: 0.50    Average  packs/day: 0.5 packs/day for 41.0 years (20.5 ttl pk-yrs)    Types: Cigarettes   Smokeless tobacco: Never  Vaping Use   Vaping status: Never Used  Substance and Sexual Activity   Alcohol use: Yes    Alcohol/week: 12.0 standard drinks of alcohol    Types: 12 Cans of beer per week    Comment: 1 twelve pack a weekend   Drug use: No   Sexual activity: Yes    Partners: Male    Birth control/protection: Surgical    Comment: hysterectomy  Other Topics Concern   Not on file  Social History Narrative   Lives by herself, has stable partner   Right handed   Social Drivers of Corporate investment banker Strain: Not on file  Food Insecurity: Not on file  Transportation Needs: Not on file  Physical Activity: Not on file  Stress: Not on file  Social Connections: Not on file     Family History: The patient's family history includes Alcohol abuse in her father; Bladder Cancer in her sister; Diabetes in her maternal aunt; Healthy in her mother; Hypertension in her father; Kidney disease in her maternal aunt; Liver disease in her father; Lung cancer in her sister; Thyroid disease in her mother.  ROS:   Please see the history of present illness.     All other systems reviewed and are negative.  EKGs/Labs/Other Studies Reviewed:    The following studies were reviewed today:       Recent Labs: 02/13/2023: ALT 17; BUN 11; Creatinine, Ser 0.88; Hemoglobin 13.4; Platelets 279.0; Potassium 3.9; Sodium 140; TSH 2.00  Recent Lipid Panel    Component Value Date/Time   CHOL 181 02/13/2023 1530   TRIG 89.0 02/13/2023 1530   HDL 45.90 02/13/2023 1530   CHOLHDL 4 02/13/2023 1530   VLDL 17.8 02/13/2023 1530   LDLCALC 117 (H) 02/13/2023 1530   LDLCALC 160 (H) 01/29/2021 1532   LDLDIRECT 147 (H) 02/01/2014 0940     Risk Assessment/Calculations:         Physical Exam:    VS:  BP (!) 151/87   Pulse 71   Ht 5\' 10"  (1.778 m)   Wt 250 lb 3.2 oz (113.5 kg)   SpO2 99%   BMI 35.90 kg/m      Wt Readings from Last 3 Encounters:  04/28/23 250 lb 3.2 oz (113.5 kg)  03/20/23 251 lb 3.2 oz (113.9 kg)  03/09/23 256 lb 9.6 oz (116.4 kg)     GEN:  Well nourished, well developed in no acute distress HEENT: Normal NECK: No JVD; No carotid bruits CARDIAC: RRR, no murmurs, rubs, gallops RESPIRATORY: Diminished breath sounds, no  wheezing ABDOMEN: Soft, non-tender, non-distended MUSCULOSKELETAL:  trace edema; No deformity  SKIN: Warm and dry NEUROLOGIC:  Alert and oriented x 3 PSYCHIATRIC:  Normal affect   ASSESSMENT:    1. Coronary artery disease involving native coronary artery of native heart, unspecified whether angina present   2. Primary hypertension   3. Pure hypercholesterolemia   4. Smoking   5. Varicose veins of both lower extremities, unspecified whether complicated     PLAN:    In order of problems listed above:  CAD/RCA coronary calcification on chest CT 12/2022.  Echo 02/2023 EF 60 to 65%, mild MR.  Continue aspirin 81 mg daily, continue simvastatin 40 mg daily.  Did not tolerate Crestor in the past.  Hypertension, BP elevated today, usually controlled.  Continue losartan 100 mg daily, Maxzide-25 daily.  Check BP at home and keep log. Hyperlipidemia, simvastatin 40 mg daily. Current smoker, smoking cessation advised. Varicose veins in lower extremity, ankle discoloration, mild leg edema.  Likely has venous insufficiency.  Compression stockings advised, keeping legs elevated when in seated position advised.  Refer to vascular specialist.  Follow-up in 6 months.      Medication Adjustments/Labs and Tests Ordered: Current medicines are reviewed at length with the patient today.  Concerns regarding medicines are outlined above.  Orders Placed This Encounter  Procedures   Ambulatory referral to Vascular Surgery   No orders of the defined types were placed in this encounter.   Patient Instructions  Medication Instructions:  Your Physician recommend you  continue on your current medication as directed.    *If you need a refill on your cardiac medications before your next appointment, please call your pharmacy*   Lab Work: None ordered at this time    Follow-Up: At [facility], you and your health needs are our priority.  As part of our continuing mission to provide you with exceptional heart care, we have created designated Provider Care Teams.  These Care Teams include your primary Cardiologist (physician) and Advanced Practice Providers (APPs -  Physician Assistants and Nurse Practitioners) who all work together to provide you with the care you need, when you need it.   Your next appointment:   6 month(s)  Provider:   You may see Debbe Odea, MD or one of the following Advanced Practice Providers on your designated Care Team:   Nicolasa Ducking, NP Eula Listen, PA-C Cadence Fransico Michael, PA-C Charlsie Quest, NP Carlos Levering, NP    Signed, Debbe Odea, MD  04/28/2023 4:51 PM    [facility]

## 2023-05-09 ENCOUNTER — Ambulatory Visit: Admitting: Family Medicine

## 2023-05-09 ENCOUNTER — Other Ambulatory Visit: Payer: Self-pay

## 2023-05-09 ENCOUNTER — Ambulatory Visit (INDEPENDENT_AMBULATORY_CARE_PROVIDER_SITE_OTHER)

## 2023-05-09 ENCOUNTER — Encounter: Payer: Self-pay | Admitting: Family Medicine

## 2023-05-09 VITALS — BP 136/84 | HR 81 | Ht 70.0 in | Wt 250.0 lb

## 2023-05-09 DIAGNOSIS — M1711 Unilateral primary osteoarthritis, right knee: Secondary | ICD-10-CM | POA: Diagnosis not present

## 2023-05-09 DIAGNOSIS — G8929 Other chronic pain: Secondary | ICD-10-CM

## 2023-05-09 DIAGNOSIS — M25561 Pain in right knee: Secondary | ICD-10-CM | POA: Diagnosis not present

## 2023-05-09 NOTE — Patient Instructions (Addendum)
 Thank you for coming in today.   You received an injection today. Seek immediate medical attention if the joint becomes red, extremely painful, or is oozing fluid.   Please get an Xray today before you leave   Please use Voltaren gel (Generic Diclofenac Gel) up to 4x daily for pain as needed.  This is available over-the-counter as both the name brand Voltaren gel and the generic diclofenac gel.   We will work to authorize the gel shots. You will hear from my office once we have approval

## 2023-05-09 NOTE — Progress Notes (Unsigned)
 I, Stevenson Clinch, CMA acting as a Neurosurgeon for Clementeen Graham, MD.  Darlene Gonzalez is a 63 y.o. female who presents to [facility] at [facility] today for R knee pain x 5 years overall, flaring up over the past week. Pt locates pain to medial aspect of the knee. Swelling and mechanical sx present, feels unstable at times. Notes radiating pain into the upper and lower leg  R Knee swelling: yes Mechanical symptoms: giving out at times Radiates: upper and lower legs Aggravates: ambulation Treatments tried: rest, elevation  Pertinent review of systems: No fevers or chills  Relevant historical information: Hypertension   Exam:  BP 136/84   Pulse 81   Ht 5\' 10"  (1.778 m)   Wt 250 lb (113.4 kg)   SpO2 96%   BMI 35.87 kg/m  General: Well Developed, well nourished, and in no acute distress.   MSK: Right knee moderate effusion decreased range of motion.  Tender to palpation. Mild laxity with valgus varus stress test. Intact strength.   Lab and Radiology Results  Procedure: Real-time Ultrasound Guided Injection of right knee joint superior lateral patella space Device: Philips Affiniti 50G/GE Logiq Images permanently stored and available for review in PACS Verbal informed consent obtained.  Discussed risks and benefits of procedure. Warned about infection, bleeding, hyperglycemia damage to structures among others. Patient expresses understanding and agreement Time-out conducted.   Noted no overlying erythema, induration, or other signs of local infection.   Skin prepped in a sterile fashion.   Local anesthesia: Topical Ethyl chloride.   With sterile technique and under real time ultrasound guidance: 40 mg of Kenalog and 2 mL of Marcaine injected into knee joint. Fluid seen entering the joint capsule.   Completed without difficulty   Pain immediately resolved suggesting accurate placement of the medication.   Advised to call if fevers/chills, erythema, induration,  drainage, or persistent bleeding.   Images permanently stored and available for review in the ultrasound unit.  Impression: Technically successful ultrasound guided injection.     X-ray images right knee obtained today personally and independently interpreted. Mild to moderate medial DJD.  No acute fractures are visible. Await formal radiology review     Assessment and Plan: 63 y.o. female with chronic right knee pain due to DJD.  Steroid injection provided today.  However based on her prior experience I do not expect this to last 3 months.  Will also work on authorization for hyaluronic acid injections and anticipate giving those in the near future.  Backup plan would be Zilretta.  Ultimately she may require knee replacement or genicular artery embolization.   PDMP not reviewed this encounter. Orders Placed This Encounter  Procedures   [place] LIMITED JOINT SPACE STRUCTURES LOW RIGHT(NO LINKED CHARGES)    Reason for Exam (SYMPTOM  OR DIAGNOSIS REQUIRED):   right knee pain    Preferred imaging location?:   [facility] Knee AP/LAT W/[place] Right    Standing Status:   Future    Number of Occurrences:   1    Expiration Date:   06/09/2023    Reason for Exam (SYMPTOM  OR DIAGNOSIS REQUIRED):   right knee pain    Preferred imaging location?:   [facility]   No orders of the defined types were placed in this encounter.    Discussed warning signs or symptoms. Please see discharge instructions. Patient expresses understanding.   The above documentation has been reviewed and is accurate  and complete Clementeen Graham, M.D.

## 2023-05-23 ENCOUNTER — Encounter: Payer: Self-pay | Admitting: Family Medicine

## 2023-05-23 NOTE — Progress Notes (Signed)
 Right knee x-ray shows mild arthritis.

## 2023-05-25 ENCOUNTER — Ambulatory Visit: Admitting: Nurse Practitioner

## 2023-05-25 VITALS — BP 128/60 | HR 85 | Temp 98.3°F | Ht 70.0 in | Wt 244.4 lb

## 2023-05-25 DIAGNOSIS — L299 Pruritus, unspecified: Secondary | ICD-10-CM | POA: Insufficient documentation

## 2023-05-25 DIAGNOSIS — B372 Candidiasis of skin and nail: Secondary | ICD-10-CM | POA: Diagnosis not present

## 2023-05-25 DIAGNOSIS — R21 Rash and other nonspecific skin eruption: Secondary | ICD-10-CM | POA: Diagnosis not present

## 2023-05-25 MED ORDER — NYSTATIN 100000 UNIT/GM EX CREA: 1.0000 | TOPICAL_CREAM | Freq: Two times a day (BID) | CUTANEOUS | 0 refills | Status: AC

## 2023-05-25 MED ORDER — PREDNISONE 20 MG PO TABS: ORAL_TABLET | ORAL | 0 refills | Status: AC

## 2023-05-25 NOTE — Assessment & Plan Note (Signed)
 Patient seen by GYN and they wrote miconazole powder which department does not have a stock.  He still present will do nystatin cream twice daily for 7 to 10 days.

## 2023-05-25 NOTE — Assessment & Plan Note (Signed)
 Zyrtec daily.  Prednisone taper as prescribed.  Can do Benadryl at night if needed.  If no benefit consider adding on H2 blocker

## 2023-05-25 NOTE — Assessment & Plan Note (Signed)
 Steroid taper as prescribed.  Patient to use Zyrtec daily can use Benadryl at night if needed.  Avoid NSAIDs take steroids with food.  Patient is an appoint with dermatology next week keep appointment as scheduled

## 2023-05-25 NOTE — Patient Instructions (Signed)
 Nice to see you today Do cetrizine (zytrec) daily Steroids in the morning with food    Your provider recommends that you complete a bowel purge (to clean out your bowels). Please do the following: Purchase a bottle of Miralax over the counter as well as a box of 5 mg dulcolax tablets. Take 4 dulcolax tablets. Wait 1 hour. You will then drink 6-8 capfuls of Miralax mixed in 32 ounces of water/juice/gatorade (you may choose which of these liquids to drink) over the next 2-3 hours. You should expect results within 1 to 6 hours after completing the bowel purge.

## 2023-05-25 NOTE — Progress Notes (Signed)
 Acute Office Visit  Subjective:     Patient ID: Darlene Gonzalez, female    DOB: [birth year], 63 y.o.   MRN: [id number]  Chief Complaint  Patient presents with   Urticaria    Pt complains of spots and hives all over body that she noticed 2 weeks. States that hives are very itchy.  Pt has taken benadryl but did not stop the itching.     HPI Patient is in today for hives with a history of HTN, GERD,, OA, tobacco abuse, insomnia, HLD, prediabetes.  State that she has been having hives for 2 weeks. States that It is getting worse. Stated that it started on her back  and then it spread.   States that she has tried benadryl that has not stopped the itching.   No change in any personal items such as detergents or soaps. She did start taking zyrtec and it happened so she stopped as she thought that waas the cause but the rash continued   Review of Systems  Constitutional:  Negative for chills and fever.  Respiratory:  Negative for shortness of breath.   Cardiovascular:  Negative for chest pain.  Skin:  Positive for itching and rash.  Neurological:  Negative for headaches.        Objective:    BP 128/60   Pulse 85   Temp 98.3 F (36.8 C) (Oral)   Ht 5\' 10"  (1.778 m)   Wt 244 lb 6.4 oz (110.9 kg)   SpO2 99%   BMI 35.07 kg/m    Physical Exam Vitals and nursing note reviewed.  Constitutional:      Appearance: Normal appearance.  Cardiovascular:     Rate and Rhythm: Normal rate and regular rhythm.     Heart sounds: Normal heart sounds.  Pulmonary:     Effort: Pulmonary effort is normal.     Breath sounds: Normal breath sounds.  Skin:    Findings: Rash present.       Neurological:     Mental Status: She is alert.     No results found for any visits on 05/25/23.      Assessment & Plan:   Problem List Items Addressed This Visit       Musculoskeletal and Integument   Rash - Primary   Steroid taper as prescribed.  Patient to use Zyrtec daily can use Benadryl at  night if needed.  Avoid NSAIDs take steroids with food.  Patient is an appoint with dermatology next week keep appointment as scheduled      Relevant Medications   predniSONE (DELTASONE) 20 MG tablet   Pruritus   Zyrtec daily.  Prednisone taper as prescribed.  Can do Benadryl at night if needed.  If no benefit consider adding on H2 blocker      Yeast infection of the skin   Patient seen by GYN and they wrote miconazole powder which department does not have a stock.  He still present will do nystatin cream twice daily for 7 to 10 days.      Relevant Medications   nystatin cream (MYCOSTATIN)    Meds ordered this encounter  Medications   predniSONE (DELTASONE) 20 MG tablet    Sig: Take 2 tablets (40 mg total) by mouth daily with breakfast for 4 days, THEN 1 tablet (20 mg total) daily with breakfast for 4 days.    Dispense:  12 tablet    Refill:  0    Supervising Provider:   Roxy Manns  A [[id number]]   nystatin cream (MYCOSTATIN)    Sig: Apply 1 Application topically 2 (two) times daily.    Dispense:  30 g    Refill:  0    Supervising Provider:   Roxy Manns A [[id number]]    Return if symptoms worsen or fail to improve, for As scheduled .  Audria Nine, NP

## 2023-06-13 ENCOUNTER — Encounter (INDEPENDENT_AMBULATORY_CARE_PROVIDER_SITE_OTHER): Admitting: Nurse Practitioner

## 2023-06-14 ENCOUNTER — Encounter: Payer: Self-pay | Admitting: Family Medicine

## 2023-06-14 ENCOUNTER — Telehealth: Payer: Self-pay

## 2023-06-14 NOTE — Telephone Encounter (Signed)
 Patient has only been seen by our office once on 05/09/23 In order to be covered [employer] [facility] requires a lot of criteria to be met.   One of the main ones is patient has to had have failure to six weeks of PT or HEP  Documented radiologic evidence of osteoarthritis of the knee (DONE)  At least TWO Pharmacological therapies such as oral or topical NSAIDS, acetaminophen , tramadol , duloxetine (DONE)  At least one injection of intraarticular corticosteroid injection (DONE)  Looks like we are waiting for six weeks of provider directed treatment of HEP or PT with a follow up to discuss failure to treatment

## 2023-06-14 NOTE — Telephone Encounter (Signed)
 Appt scheduled 06/21/23.

## 2023-06-21 ENCOUNTER — Ambulatory Visit: Admitting: Family Medicine

## 2023-06-21 VITALS — BP 150/80 | HR 80 | Ht 70.0 in | Wt 246.0 lb

## 2023-06-21 DIAGNOSIS — M2550 Pain in unspecified joint: Secondary | ICD-10-CM

## 2023-06-21 DIAGNOSIS — M1711 Unilateral primary osteoarthritis, right knee: Secondary | ICD-10-CM

## 2023-06-21 DIAGNOSIS — G8929 Other chronic pain: Secondary | ICD-10-CM

## 2023-06-21 DIAGNOSIS — M25561 Pain in right knee: Secondary | ICD-10-CM | POA: Diagnosis not present

## 2023-06-21 DIAGNOSIS — L409 Psoriasis, unspecified: Secondary | ICD-10-CM

## 2023-06-21 LAB — SEDIMENTATION RATE: Sed Rate: 33 mm/h — ABNORMAL HIGH (ref 0–30)

## 2023-06-21 MED ORDER — TIZANIDINE HCL 2 MG PO TABS
2.0000 mg | ORAL_TABLET | Freq: Three times a day (TID) | ORAL | 1 refills | Status: AC | PRN
Start: 2023-06-21 — End: ?

## 2023-06-21 NOTE — Patient Instructions (Addendum)
 Thank you for coming in today.    Please get labs today before you leave   You should hear about Zilretta 

## 2023-06-21 NOTE — Progress Notes (Signed)
 Joanna Muck, PhD, LAT, ATC acting as a scribe for Garlan Juniper, MD.  Darlene Gonzalez is a 63 y.o. female who presents to [facility] at [facility] today for cont'd R knee pain. Pt was last seen by Dr. Alease Hunter on 05/09/23 and was given a R knee steroid injection.  Today, pt reports R knee continues, slightly better than it was, but is still pretty painful. She notes swelling remains. Pain is disturbing her sleep at night. Already taking Ambien .  She was last seen on March 18.  Since then she has completed good physician directed conservative management with home exercise including 20 minutes of open chain quad exercises including straight leg raise and knee extension for at least 20 minutes a day at least 4 times a week.  She rates her pain as bad as 8 out of 10 and has trouble sleeping despite Ambien . She got moderate relief from the steroid injection.  She noted relief of about 80% from baseline but it only lasted a few weeks.  Dx imaging: 05/09/23 R knee XR  Pertinent review of systems: No fevers or chills  Relevant historical information: Hypertension Psoriasis evaluating for treatment with Biologics by dermatology.  She does have pain elsewhere in her body as well.  Exam:  BP (!) 150/80   Pulse 80   Ht 5\' 10"  (1.778 m)   Wt 246 lb (111.6 kg)   SpO2 94%   BMI 35.30 kg/m  General: Well Developed, well nourished, and in no acute distress.   MSK: Right knee normal-appearing normal motion.    Lab and Radiology Results Results for orders placed or performed in visit on 06/21/23 (from the past 72 hours)  Sedimentation rate     Status: Abnormal   Collection Time: 06/21/23  4:10 PM  Result Value Ref Range   Sed Rate 33 (H) 0 - 30 mm/hr   No results found.  EXAM: RIGHT KNEE 3 VIEWS   COMPARISON:  June 17, 2018   FINDINGS: No acute fracture or dislocation. Subcortical cyst along the medial femoral condyle. Mild joint space narrowing in the medial  and lateral compartments with mild osteophyte formation. No area of erosion or osseous destruction. No unexpected radiopaque foreign body. Soft tissues are unremarkable.   IMPRESSION: Mild degenerative changes of the right knee.     Electronically Signed   By: Clancy Crimes M.D.   On: 05/23/2023 09:24   I, Garlan Juniper, personally (independently) visualized and performed the interpretation of the images attached in this note.    Assessment and Plan: 63 y.o. female with phonic right knee pain due to DJD.  Patient has had a steroid injection which helped but did not last.  Plan for authorization for Zilretta  with backup plan for gel injections.  Additionally she has psoriasis and is currently being evaluated for treatment with Biologics.  Will proceed with a rheumatologic workup including HLA-B27 to evaluate for psoriatic arthritis or other rheumatologic conditions.  Additionally prescribed tizanidine  to use at bedtime.  Anticipate proceeding with Zilretta  injection in the near future.   PDMP not reviewed this encounter. Orders Placed This Encounter  Procedures   Sedimentation rate    Standing Status:   Future    Number of Occurrences:   1    Expiration Date:   06/20/2024   Rheumatoid factor    Standing Status:   Future    Number of Occurrences:   1    Expiration Date:   06/20/2024  Cyclic citrul peptide antibody, IgG    Standing Status:   Future    Number of Occurrences:   1    Expiration Date:   06/20/2024   ANA    Standing Status:   Future    Number of Occurrences:   1    Expiration Date:   06/20/2024   HLA-B27 antigen    Standing Status:   Future    Number of Occurrences:   1    Expiration Date:   06/20/2024   Meds ordered this encounter  Medications   tiZANidine  (ZANAFLEX ) 2 MG tablet    Sig: Take 1-2 tablets (2-4 mg total) by mouth every 8 (eight) hours as needed.    Dispense:  60 tablet    Refill:  1     Discussed warning signs or symptoms. Please see  discharge instructions. Patient expresses understanding.   The above documentation has been reviewed and is accurate and complete Garlan Juniper, M.D.

## 2023-06-22 ENCOUNTER — Ambulatory Visit: Payer: Managed Care, Other (non HMO) | Admitting: Nurse Practitioner

## 2023-06-22 VITALS — BP 132/84 | HR 73 | Temp 98.2°F | Ht 70.0 in | Wt 248.0 lb

## 2023-06-22 DIAGNOSIS — E119 Type 2 diabetes mellitus without complications: Secondary | ICD-10-CM | POA: Diagnosis not present

## 2023-06-22 DIAGNOSIS — I10 Essential (primary) hypertension: Secondary | ICD-10-CM | POA: Diagnosis not present

## 2023-06-22 DIAGNOSIS — Z7985 Long-term (current) use of injectable non-insulin antidiabetic drugs: Secondary | ICD-10-CM | POA: Diagnosis not present

## 2023-06-22 LAB — POCT GLYCOSYLATED HEMOGLOBIN (HGB A1C): Hemoglobin A1C: 6.2 % — AB (ref 4.0–5.6)

## 2023-06-22 MED ORDER — OZEMPIC (0.25 OR 0.5 MG/DOSE) 2 MG/3ML ~~LOC~~ SOPN: 0.2500 mg | PEN_INJECTOR | SUBCUTANEOUS | 0 refills | Status: DC

## 2023-06-22 NOTE — Progress Notes (Signed)
 Sedimentation rate is mildly elevated.  Other rheumatology labs are still pending.

## 2023-06-22 NOTE — Patient Instructions (Signed)
 Nice to see you today I have sent in the ozempic  to the pharmacy  Follow up with me in 3 months, sooner if you need me

## 2023-06-22 NOTE — Assessment & Plan Note (Signed)
 Patient was seen by GYN A1c rechecked at 6.5% patient now considered diabetic.  Patient has tried and failed metformin  and could not tolerate due to GI side effects.  Patient has several comorbidities inclusive of obesity and hypertension in the setting of diabetes we will trial Ozempic  0.25 mg once a week for 4 weeks then anticipate to titrate up to Ozempic  0.5 mg weekly thereafter.  Patient denies personal or family history of medullary thyroid  cancer or multiple endocrine neoplasia syndrome type II.

## 2023-06-22 NOTE — Assessment & Plan Note (Signed)
 Patient currently maintained on losartan  100 mg daily and Maxide daily.  Blood pressure elevated on initial check better upon recheck.  Will continue with medications as prescribed continue working on healthy lifestyle modifications.

## 2023-06-22 NOTE — Progress Notes (Signed)
 Established Patient Office Visit  Subjective   Patient ID: Darlene Gonzalez, female    DOB: 1960/07/02  Age: 63 y.o. MRN: [id number]  Chief Complaint  Patient presents with   Diabetes    HPI  HTN: patient is currently maintained on Losartan  100 mg daily and Maxide daily and maxzide daily. She has not been checking it at home. She is interested in coming off some of these medications   DM2: Patient's last A1c was 6.5% this is considered diabetic range but well-controlled.  Patient is here for follow-up Currently patient is not on any antidiabetic medications. States that she has been doing approx  States she has been avoiding fst food and avoiding late night snacking. She still drinking some sodas but has been cutting back  States that we have tired and failed metformin .  She could not tolerate it due to GI side effects.      Review of Systems  Constitutional:  Negative for chills and fever.  Respiratory:  Negative for shortness of breath.   Cardiovascular:  Negative for chest pain.  Neurological:  Negative for headaches.      Objective:     BP 132/84   Pulse 73   Temp 98.2 F (36.8 C) (Oral)   Ht 5\' 10"  (1.778 m)   Wt 248 lb (112.5 kg)   SpO2 98%   BMI 35.58 kg/m  BP Readings from Last 3 Encounters:  06/22/23 132/84  06/21/23 (!) 150/80  05/25/23 128/60   Wt Readings from Last 3 Encounters:  06/22/23 248 lb (112.5 kg)  06/21/23 246 lb (111.6 kg)  05/25/23 244 lb 6.4 oz (110.9 kg)   SpO2 Readings from Last 3 Encounters:  06/22/23 98%  06/21/23 94%  05/25/23 99%      Physical Exam Vitals and nursing note reviewed.  Constitutional:      Appearance: Normal appearance.  Cardiovascular:     Rate and Rhythm: Normal rate and regular rhythm.     Heart sounds: Normal heart sounds.  Pulmonary:     Effort: Pulmonary effort is normal.     Breath sounds: Normal breath sounds.  Abdominal:     General: Bowel sounds are normal.  Neurological:     Mental Status:  She is alert.      Results for orders placed or performed in visit on 06/22/23  POCT glycosylated hemoglobin (Hb A1C)  Result Value Ref Range   Hemoglobin A1C 6.2 (A) 4.0 - 5.6 %   HbA1c POC (<> result, manual entry)     HbA1c, POC (prediabetic range)     HbA1c, POC (controlled diabetic range)        The 10-year ASCVD risk score (Arnett DK, et al., 2019) is: 35%    Assessment & Plan:   Problem List Items Addressed This Visit       Cardiovascular and Mediastinum   Essential hypertension - Primary (Chronic)   Patient currently maintained on losartan  100 mg daily and Maxide daily.  Blood pressure elevated on initial check better upon recheck.  Will continue with medications as prescribed continue working on healthy lifestyle modifications.        Endocrine   Controlled type 2 diabetes mellitus without complication, without long-term current use of insulin (HCC)   Patient was seen by GYN A1c rechecked at 6.5% patient now considered diabetic.  Patient has tried and failed metformin  and could not tolerate due to GI side effects.  Patient has several comorbidities inclusive of obesity and hypertension  in the setting of diabetes we will trial Ozempic  0.25 mg once a week for 4 weeks then anticipate to titrate up to Ozempic  0.5 mg weekly thereafter.  Patient denies personal or family history of medullary thyroid  cancer or multiple endocrine neoplasia syndrome type II.      Relevant Medications   Semaglutide ,0.25 or 0.5MG /DOS, (OZEMPIC , 0.25 OR 0.5 MG/DOSE,) 2 MG/3ML SOPN   Other Relevant Orders   POCT glycosylated hemoglobin (Hb A1C) (Completed)    Return in about 3 months (around 09/22/2023) for DM recheck.    Margarie Shay, NP

## 2023-06-24 LAB — ANA: Anti Nuclear Antibody (ANA): NEGATIVE

## 2023-06-24 LAB — HLA-B27 ANTIGEN: HLA-B27 Antigen: NEGATIVE

## 2023-06-24 LAB — CYCLIC CITRUL PEPTIDE ANTIBODY, IGG: Cyclic Citrullin Peptide Ab: <16 U

## 2023-06-24 LAB — RHEUMATOID FACTOR: Rheumatoid fact SerPl-aCnc: <10 [IU]/mL (ref <14)

## 2023-06-26 NOTE — Progress Notes (Signed)
 Further rheumatology labs are normal.

## 2023-06-27 ENCOUNTER — Telehealth: Payer: Self-pay

## 2023-06-27 NOTE — Telephone Encounter (Signed)
 Patient called back and I asked about her preference for the injection. She stated you had discussed the gel shot with her but she did not give a final answer. She also was asking about some blood work she had done so I told her I would leave a note that she called so that you could give her a call back. Please advise.

## 2023-06-27 NOTE — Telephone Encounter (Signed)
 Called pt, left VM to call the office. Would like to see if she prefers to start with Zilretta  or Visco / Gel shot.

## 2023-06-27 NOTE — Telephone Encounter (Signed)
-----   Message from [facility] sent at 06/22/2023 10:18 AM EDT ----- Regarding: RE: Zilretta  Can you ask Dr. Alease Hunter just to be sure! ----- Message ----- From: Charle Congo, CMA Sent: 06/22/2023   9:27 AM EDT To: Sheria Dills, CMA Subject: RE: Zilretta                                    Plan for authorization for Zilretta  with backup plan for gel injections.  Zilretta  preferred, Gel if Zilretta  is not covered.   Thanks! ----- Message ----- From: Sheria Dills, CMA Sent: 06/22/2023   8:42 AM EDT To: Syliva Even, MD; Charle Congo, CMA Subject: RE: Zilretta                                    Is it Zilretta  that you are wanting ran? I ran patient for gel and looks like the office note says gel. I just want to make sure before I re-run patient ----- Message ----- From: Syliva Even, MD Sent: 06/22/2023   7:11 AM EDT To: Sheria Dills, CMA Subject: Zilretta                                        We should be able to get Zilretta  approved now.

## 2023-06-27 NOTE — Telephone Encounter (Signed)
 Called and spoke with patient, she would like to proceed with gel shots first, as Zilretta  may only be covered once per knee per lifetime with [facility].

## 2023-06-27 NOTE — Telephone Encounter (Signed)
 Ran benefits for gelsyn

## 2023-06-29 ENCOUNTER — Telehealth: Payer: Self-pay | Admitting: Pharmacist

## 2023-06-29 NOTE — Telephone Encounter (Signed)
 Pharmacy Patient Advocate Encounter   Received notification from Patient Pharmacy that prior authorization for Ozempic  (0.25 or 0.5 MG/DOSE) 2MG /3ML pen-injectors is required/requested.   Insurance verification completed.   The patient is insured through [employer] .   Per test claim: PA required; PA submitted to above mentioned insurance via CoverMyMeds Key/confirmation #/EOC [id number] Status is pending

## 2023-07-05 NOTE — Telephone Encounter (Signed)
 Pharmacy Patient Advocate Encounter  Received notification from [employer] that Prior Authorization for Ozempic  (0.25 or 0.5 MG/DOSE) 2MG /3ML pen-injectors has been APPROVED from 06/29/2023 to 07/03/2024   PA #/Case ID/Reference #: [id number]

## 2023-07-06 ENCOUNTER — Ambulatory Visit (INDEPENDENT_AMBULATORY_CARE_PROVIDER_SITE_OTHER): Admitting: Nurse Practitioner

## 2023-07-06 VITALS — BP 133/82 | HR 86 | Resp 17 | Ht 70.0 in | Wt 246.6 lb

## 2023-07-06 DIAGNOSIS — I10 Essential (primary) hypertension: Secondary | ICD-10-CM

## 2023-07-06 DIAGNOSIS — I8393 Asymptomatic varicose veins of bilateral lower extremities: Secondary | ICD-10-CM | POA: Diagnosis not present

## 2023-07-06 DIAGNOSIS — E119 Type 2 diabetes mellitus without complications: Secondary | ICD-10-CM | POA: Diagnosis not present

## 2023-07-10 ENCOUNTER — Encounter (INDEPENDENT_AMBULATORY_CARE_PROVIDER_SITE_OTHER): Payer: Self-pay | Admitting: Nurse Practitioner

## 2023-07-10 NOTE — Progress Notes (Signed)
 Subjective:    Patient ID: Darlene Gonzalez, female    DOB: [birth year], 63 y.o.   MRN: [id number] Chief Complaint  Patient presents with   Establish Care    The patient is a 63 year old female who presents today for evaluation of asymptomatic varicose veins.  She notes that she mention some discoloration to her cardiologist and a referral was placed here.  She notes that she does not have any significant pain or discomfort.  She is mostly concerned about the discoloration of the ankle area.  She denies claudication or rest pain.  Currently there are no open wounds or ulcerations.    Review of Systems  Cardiovascular:  Positive for leg swelling.  All other systems reviewed and are negative.      Objective:    Physical Exam Vitals reviewed.  HENT:     Head: Normocephalic.  Cardiovascular:     Rate and Rhythm: Normal rate.     Pulses: Normal pulses.  Pulmonary:     Effort: Pulmonary effort is normal.  Skin:    General: Skin is warm and dry.  Neurological:     Mental Status: She is alert and oriented to person, place, and time.  Psychiatric:        Mood and Affect: Mood normal.        Behavior: Behavior normal.        Thought Content: Thought content normal.        Judgment: Judgment normal.     BP 133/82 (BP Location: Left Arm, Patient Position: Sitting, Cuff Size: Large)   Pulse 86   Resp 17   Ht 5\' 10"  (1.778 m)   Wt 246 lb 9.6 oz (111.9 kg)   BMI 35.38 kg/m   Past Medical History:  Diagnosis Date   Adenomatous colon polyp    Anemia    Chronic headaches    Diverticulosis    Esophageal stricture    Fluid in knee    Bilateral   GERD (gastroesophageal reflux disease)    Hemorrhagic ovarian cyst 01/2006   Right   Hemorrhoids    Hiatal hernia    HTN (hypertension)    Hyperplastic colon polyp    Hypokalemia 06/2005   2nd to diuretic    Osteoarthritis, knee    Bilateral   Pneumonia    Pyelonephritis 06/2005   Uncomplicated   Recurrent boils    Underarms    Tobacco abuse    UTI (lower urinary tract infection)    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    Social History   Socioeconomic History   Marital status: Single    Spouse name: Not on file   Number of children: 0   Years of education: 12th grade   Highest education level: Not on file  Occupational History   Occupation: Holiday representative: [employer]   Occupation: Deli    Employer: [employer]  Tobacco Use   Smoking status: Every Day    Current packs/day: 0.50    Average packs/day: 0.5 packs/day for 41.0 years (20.5 ttl pk-yrs)    Types: Cigarettes   Smokeless tobacco: Never  Vaping Use   Vaping status: Never Used  Substance and Sexual Activity   Alcohol use: Yes    Alcohol/week: 12.0 standard drinks of alcohol    Types: 12 Cans of beer per week    Comment: 1 twelve pack a weekend   Drug use: No   Sexual activity: Yes    Partners: Male  Birth control/protection: Surgical    Comment: hysterectomy  Other Topics Concern   Not on file  Social History Narrative   Lives by herself, has stable partner   Right handed   Social Drivers of Health   Financial Resource Strain: Not on file  Food Insecurity: Not on file  Transportation Needs: Not on file  Physical Activity: Not on file  Stress: Not on file  Social Connections: Not on file  Intimate Partner Violence: Not on file    Past Surgical History:  Procedure Laterality Date   ABDOMINAL HYSTERECTOMY  1998   Dr April Knack for fibroids (supracervical)   BREAST BIOPSY     BREAST CYST EXCISION Right     Family History  Problem Relation Age of Onset   Healthy Mother        Living   Thyroid  disease Mother    Hypertension Father        Deceased   Liver disease Father        Cirrhosis   Alcohol abuse Father    Lung cancer Sister    Bladder Cancer Sister    Kidney disease Maternal Aunt    Diabetes Maternal Aunt        x2    Allergies  Allergen Reactions   Crestor  [Rosuvastatin ] Other (See Comments)    Muscle  cramps   Lisinopril  Cough    Tolerating Losartan  fine       Latest Ref Rng & Units 02/13/2023    3:30 PM 02/03/2022    3:21 PM 08/05/2021    3:43 PM  CBC  WBC 4.0 - 10.5 K/uL 7.8  9.3  7.2   Hemoglobin 12.0 - 15.0 g/dL 54.0  98.1  19.1   Hematocrit 36.0 - 46.0 % 41.1  40.2  37.8   Platelets 150.0 - 400.0 K/uL 279.0  273.0  231.0        CMP     Component Value Date/Time   NA 140 02/13/2023 1530   K 3.9 02/13/2023 1530   CL 99 02/13/2023 1530   CO2 33 (H) 02/13/2023 1530   GLUCOSE 99 02/13/2023 1530   BUN 11 02/13/2023 1530   CREATININE 0.88 02/13/2023 1530   CREATININE 0.77 01/29/2021 1532   CALCIUM  9.1 02/13/2023 1530   PROT 7.1 02/13/2023 1530   ALBUMIN 4.1 02/13/2023 1530   AST 20 02/13/2023 1530   ALT 17 02/13/2023 1530   ALKPHOS 101 02/13/2023 1530   BILITOT 0.7 02/13/2023 1530   GFR 69.98 02/13/2023 1530   EGFR 88 01/29/2021 1532     No results found.     Assessment & Plan:   1. Asymptomatic varicose veins of both lower extremities (Primary) Discoloration on the patient's ankle appears to be more so related to stasis dermatitis.  I had a long discussion with the patient regarding stasis dermatitis to her legs to varicose veins.  At this time the patient does not have any significant pain or discomfort from the varicose veins.  Because of that she will proceed with conservative therapies as we discussed such as medical grade compression socks, elevation and activity.  She will follow-up with us  on an as-needed basis.  2. Essential hypertension Continue antihypertensive medications as already ordered, these medications have been reviewed and there are no changes at this time.  3. Controlled type 2 diabetes mellitus without complication, without long-term current use of insulin (HCC) Continue hypoglycemic medications as already ordered, these medications have been reviewed and there are no changes at  this time.  Hgb A1C to be monitored as already arranged by  primary service   Current Outpatient Medications on File Prior to Visit  Medication Sig Dispense Refill   aspirin  EC 81 MG tablet Take 1 tablet (81 mg total) by mouth daily. Swallow whole.     cetirizine  (ZYRTEC  ALLERGY) 10 MG tablet Take 1 tablet (10 mg total) by mouth daily. 14 tablet 0   clobetasol  ointment (TEMOVATE ) 0.05 % Apply 0.5 grams to the vulvar skin twice daily for 2 weeks for 1 month and then 0.5 grams daily for another 1 months.  Then apply 0.5 grams topically twice weekly. 60 g 3   fluconazole  (DIFLUCAN ) 150 MG tablet Take 1 tablet (150 mg total) by mouth every other day as needed. 2 tablet 4   losartan  (COZAAR ) 100 MG tablet Take 1 tablet (100 mg total) by mouth daily. 90 tablet 1   Miconazole  Nitrate 2 % POWD 1 Application by Does not apply route 3 (three) times daily as needed. Apply under breasts three times daily as needed 500 g 3   pantoprazole  (PROTONIX ) 40 MG tablet Take 1 tablet by mouth once daily 90 tablet 3   simvastatin  (ZOCOR ) 40 MG tablet Take 1 tablet (40 mg total) by mouth daily. 90 tablet 1   triamterene -hydrochlorothiazide (MAXZIDE-25) 37.5-25 MG tablet Take 1 tablet by mouth daily. 90 tablet 1   Vitamin D , Ergocalciferol , (DRISDOL ) 1.25 MG (50000 UNIT) CAPS capsule Take 1 capsule (50,000 Units total) by mouth every 7 (seven) days. 12 capsule 3   zolpidem  (AMBIEN ) 10 MG tablet Take 1 tablet (10 mg total) by mouth at bedtime as needed. for sleep 30 tablet 2   fluticasone  (FLONASE ) 50 MCG/ACT nasal spray Place 1 spray into both nostrils daily. (Patient not taking: Reported on 07/06/2023) 16 g 0   nystatin  cream (MYCOSTATIN ) Apply 1 Application topically 2 (two) times daily. (Patient not taking: Reported on 07/06/2023) 30 g 0   Semaglutide ,0.25 or 0.5MG /DOS, (OZEMPIC , 0.25 OR 0.5 MG/DOSE,) 2 MG/3ML SOPN Inject 0.25 mg into the skin once a week. (Patient not taking: Reported on 07/06/2023) 3 mL 0   tiZANidine  (ZANAFLEX ) 2 MG tablet Take 1-2 tablets (2-4 mg total) by  mouth every 8 (eight) hours as needed. (Patient not taking: Reported on 07/06/2023) 60 tablet 1   No current facility-administered medications on file prior to visit.    There are no Patient Instructions on file for this visit. No follow-ups on file.   Uldine Fuster E Deionna Marcantonio, NP

## 2023-07-11 ENCOUNTER — Encounter (INDEPENDENT_AMBULATORY_CARE_PROVIDER_SITE_OTHER): Payer: Self-pay

## 2023-07-21 ENCOUNTER — Telehealth: Payer: Self-pay

## 2023-07-21 NOTE — Telephone Encounter (Signed)
 Can you please schedule patient when medication is stocked   Gelsyn authorized for right knee Covered at 75% of allowable amount Deductible $3250 has met $3250 OOP MAX $7000 has met $5567.46 Copay $ 65 Once OOP is met coverage goes to 100% and copay will no longer apply PA APPROVED  Auth number [id number] 05/18/23-11/18/23

## 2023-07-25 NOTE — Telephone Encounter (Signed)
 Scheduled

## 2023-08-03 ENCOUNTER — Ambulatory Visit: Admitting: Family Medicine

## 2023-08-03 ENCOUNTER — Other Ambulatory Visit: Payer: Self-pay

## 2023-08-03 VITALS — BP 132/82 | HR 86 | Ht 70.0 in | Wt 246.0 lb

## 2023-08-03 DIAGNOSIS — M1711 Unilateral primary osteoarthritis, right knee: Secondary | ICD-10-CM

## 2023-08-03 DIAGNOSIS — G8929 Other chronic pain: Secondary | ICD-10-CM

## 2023-08-03 MED ORDER — SODIUM HYALURONATE (VISCOSUP) 16.8 MG/2ML IX SOSY
16.8000 mg | PREFILLED_SYRINGE | Freq: Once | INTRA_ARTICULAR | Status: AC
  Administered 2023-08-03: 16.8 mg via INTRA_ARTICULAR

## 2023-08-03 NOTE — Progress Notes (Addendum)
   Darlene Muck, PhD, LAT, ATC acting as a scribe for Darlene Juniper, MD.  Darlene Gonzalez is a 63 y.o. female who presents to [facility] today for cont'd R knee pain and to start the Gelsyn series. Pt was last seen by Dr. Alease Hunter on 06/21/23 and labs were obtained and we worked to authorize gel shots.  Today, pt reports she is wanting start the gel series today  Dx testing: 06/21/23 Labs 05/09/23 R knee XR   Pertinent review of systems: No fevers or chills  Relevant historical information: Hypertension and diabetes.   Exam:  BP 132/82   Pulse 86   Ht 5' 10 (1.778 m)   Wt 246 lb (111.6 kg)   SpO2 98%   BMI 35.30 kg/m  General: Well Developed, well nourished, and in no acute distress.   MSK: Right knee minimal effusion normal-appearing otherwise.  Normal motion.    Lab and Radiology Results  Darlene Gonzalez presents to clinic today for Gelsyn injection right knee 1/3 Procedure: Real-time Ultrasound Guided Injection of right knee joint superior lateral patellar space Device: Philips Affiniti 50G/GE Logiq Images permanently stored and available for review in PACS Verbal informed consent obtained.  Discussed risks and benefits of procedure. Warned about infection, bleeding, damage to structures among others. Patient expresses understanding and agreement Time-out conducted.   Noted no overlying erythema, induration, or other signs of local infection.   Skin prepped in a sterile fashion.   Local anesthesia: Topical Ethyl chloride.   With sterile technique and under real time ultrasound guidance: Gelsyn 2 mL injected into knee joint. Fluid seen entering the joint capsule.   Completed without difficulty   Advised to call if fevers/chills, erythema, induration, drainage, or persistent bleeding.   Images permanently stored and available for review in the ultrasound unit.  Impression: Technically successful ultrasound guided injection. Lot number:  [id number]    Assessment and Plan: 63 y.o. female with right knee pain due to DJD.  Plan to start Gelsyn series today.  Return in 1 week for Gelsyn injection 2/3.   PDMP not reviewed this encounter. Orders Placed This Encounter  Procedures   US  LIMITED JOINT SPACE STRUCTURES LOW RIGHT(NO LINKED CHARGES)    Reason for Exam (SYMPTOM  OR DIAGNOSIS REQUIRED):   right knee pain    Preferred imaging location?:   [facility] ordered this encounter  Medications   sodium hyaluronate (viscosup) (GELSYN-3) intra-articular injection 16.8 mg     Discussed warning signs or symptoms. Please see discharge instructions. Patient expresses understanding.   The above documentation has been reviewed and is accurate and complete Darlene Gonzalez, M.D.

## 2023-08-03 NOTE — Patient Instructions (Signed)
 Thank you for coming in today.   You received an injection today. Seek immediate medical attention if the joint becomes red, extremely painful, or is oozing fluid.   Schedule the 2nd gel shot for next week. And the 3rd for the week after that.

## 2023-08-10 ENCOUNTER — Ambulatory Visit: Admitting: Family Medicine

## 2023-08-10 ENCOUNTER — Other Ambulatory Visit: Payer: Self-pay

## 2023-08-10 DIAGNOSIS — M1711 Unilateral primary osteoarthritis, right knee: Secondary | ICD-10-CM

## 2023-08-10 MED ORDER — SODIUM HYALURONATE (VISCOSUP) 16.8 MG/2ML IX SOSY
16.8000 mg | PREFILLED_SYRINGE | Freq: Once | INTRA_ARTICULAR | Status: AC
Start: 2023-08-10 — End: 2023-08-10
  Administered 2023-08-10: 16.8 mg via INTRA_ARTICULAR

## 2023-08-10 NOTE — Patient Instructions (Signed)
Thank you for coming in today.   You received an injection today. Seek immediate medical attention if the joint becomes red, extremely painful, or is oozing fluid.   We will see you next week for the 3rd Gelsyn injection 

## 2023-08-10 NOTE — Progress Notes (Signed)
 Darlene Gonzalez presents to clinic today for Gelsyn injection right knee 2/3 Procedure: Real-time Ultrasound Guided Injection of right knee joint superior lateral patellar space Device: Philips Affiniti 50G/GE Logiq Images permanently stored and available for review in PACS Verbal informed consent obtained.  Discussed risks and benefits of procedure. Warned about infection, bleeding, damage to structures among others. Patient expresses understanding and agreement Time-out conducted.   Noted no overlying erythema, induration, or other signs of local infection.   Skin prepped in a sterile fashion.   Local anesthesia: Topical Ethyl chloride.   With sterile technique and under real time ultrasound guidance: Gelsyn 2 mL injected into knee joint. Fluid seen entering the joint capsule.   Completed without difficulty   Advised to call if fevers/chills, erythema, induration, drainage, or persistent bleeding.   Images permanently stored and available for review in the ultrasound unit.  Impression: Technically successful ultrasound guided injection. Lot number: [id number] Return in 1 week for Gelsyn injection right knee 3/3

## 2023-08-14 ENCOUNTER — Ambulatory Visit: Payer: Managed Care, Other (non HMO) | Admitting: Nurse Practitioner

## 2023-08-14 NOTE — Progress Notes (Deleted)
   Established Patient Office Visit  Subjective   Patient ID: Darlene Gonzalez, female    DOB: 1960/10/02  Age: 63 y.o. MRN: [id number]  No chief complaint on file.   HPI  DM2: Patient's last A1c was 06/22/2023 and was 6.2 at that juncture we did prescribe ozempic    Hypertension:  {History (Optional):23778}  ROS    Objective:     There were no vitals taken for this visit. {Vitals History (Optional):23777}  Physical Exam   No results found for any visits on 08/14/23.  {Labs (Optional):23779}  The 10-year ASCVD risk score (Arnett DK, et al., 2019) is: 35%    Assessment & Plan:   Problem List Items Addressed This Visit   None   No follow-ups on file.    Adina Crandall, NP

## 2023-08-17 ENCOUNTER — Ambulatory Visit: Admitting: Family Medicine

## 2023-08-17 ENCOUNTER — Other Ambulatory Visit: Payer: Self-pay

## 2023-08-17 ENCOUNTER — Telehealth: Payer: Self-pay | Admitting: Family Medicine

## 2023-08-17 DIAGNOSIS — M1711 Unilateral primary osteoarthritis, right knee: Secondary | ICD-10-CM | POA: Diagnosis not present

## 2023-08-17 MED ORDER — SODIUM HYALURONATE (VISCOSUP) 16.8 MG/2ML IX SOSY
16.8000 mg | PREFILLED_SYRINGE | Freq: Once | INTRA_ARTICULAR | Status: AC
  Administered 2023-08-17: 16.8 mg via INTRA_ARTICULAR

## 2023-08-17 NOTE — Telephone Encounter (Signed)
 Pt had a Taltz injection 08/16/2023, confirmed with Dr. Joane that she can proceed with gel injection series today.

## 2023-08-17 NOTE — Patient Instructions (Signed)
 Thank you for coming in today.   You received an injection today. Seek immediate medical attention if the joint becomes red, extremely painful, or is oozing fluid.   That completes the gel shot series  Check back as needed  Reminder, Dr. Joane will be out of the office starting August 1st for about 6 weeks

## 2023-08-17 NOTE — Progress Notes (Signed)
 Darlene Gonzalez presents to clinic today for Gelsyn injection right knee 3/3 Procedure: Real-time Ultrasound Guided Injection of right knee joint superior lateral patellar space Device: Philips Affiniti 50G/GE Logiq Images permanently stored and available for review in PACS Verbal informed consent obtained.  Discussed risks and benefits of procedure. Warned about infection, bleeding, damage to structures among others. Patient expresses understanding and agreement Time-out conducted.   Noted no overlying erythema, induration, or other signs of local infection.   Skin prepped in a sterile fashion.   Local anesthesia: Topical Ethyl chloride.   With sterile technique and under real time ultrasound guidance: Gelsyn 2 mL injected into knee joint. Fluid seen entering the joint capsule.   Completed without difficulty   Advised to call if fevers/chills, erythema, induration, drainage, or persistent bleeding.   Images permanently stored and available for review in the ultrasound unit.  Impression: Technically successful ultrasound guided injection. Lot number: [id number]

## 2023-09-04 ENCOUNTER — Other Ambulatory Visit: Payer: Self-pay | Admitting: Internal Medicine

## 2023-09-04 DIAGNOSIS — K219 Gastro-esophageal reflux disease without esophagitis: Secondary | ICD-10-CM

## 2023-09-21 ENCOUNTER — Other Ambulatory Visit: Payer: Self-pay | Admitting: Nurse Practitioner

## 2023-09-21 DIAGNOSIS — G4700 Insomnia, unspecified: Secondary | ICD-10-CM

## 2023-09-22 ENCOUNTER — Ambulatory Visit: Admitting: Nurse Practitioner

## 2023-09-22 VITALS — BP 122/60 | HR 76 | Temp 97.7°F | Ht 70.0 in | Wt 251.4 lb

## 2023-09-22 DIAGNOSIS — F17200 Nicotine dependence, unspecified, uncomplicated: Secondary | ICD-10-CM | POA: Diagnosis not present

## 2023-09-22 DIAGNOSIS — R600 Localized edema: Secondary | ICD-10-CM | POA: Diagnosis not present

## 2023-09-22 DIAGNOSIS — E119 Type 2 diabetes mellitus without complications: Secondary | ICD-10-CM

## 2023-09-22 DIAGNOSIS — I10 Essential (primary) hypertension: Secondary | ICD-10-CM | POA: Diagnosis not present

## 2023-09-22 LAB — POCT GLYCOSYLATED HEMOGLOBIN (HGB A1C): Hemoglobin A1C: 6.1 % — AB (ref 4.0–5.6)

## 2023-09-22 MED ORDER — BLOOD GLUCOSE MONITORING SUPPL DEVI: 1.0000 | Freq: Every day | 0 refills | Status: AC

## 2023-09-22 MED ORDER — LANCET DEVICE MISC: 1.0000 | Freq: Every day | 0 refills | Status: AC

## 2023-09-22 MED ORDER — LANCETS MISC. MISC: 1.0000 | Freq: Every day | 0 refills | Status: AC

## 2023-09-22 MED ORDER — BLOOD GLUCOSE TEST VI STRP: 1.0000 | ORAL_STRIP | Freq: Every day | 0 refills | Status: AC

## 2023-09-22 NOTE — Assessment & Plan Note (Signed)
 Patient is A1c is 6.1% today.  She has not picked up Ozempic  as of yet as she did not was ready.  Call pharmacy to verify patient can pick it up at any point in time.  Patient will start Ozempic  0.25 mg once a week for 4 weeks then titrate up to 0.5 mg weekly thereafter.  Signs and symptoms of hypoglycemia printed off for patient to review patient also sent in glucose monitoring equipment.  She will follow-up in 3 months sooner if she needs me

## 2023-09-22 NOTE — Assessment & Plan Note (Signed)
 Patient currently maintained on losartan  100 mg Daily and Maxide daily.  Blood pressure well-controlled.  Continue medication as prescribed

## 2023-09-22 NOTE — Progress Notes (Signed)
 Established Patient Office Visit  Subjective   Patient ID: Darlene Gonzalez, female    DOB: 18-Mar-1960  Age: 63 y.o. MRN: [id number]  Chief Complaint  Patient presents with   Diabetes      DM2: patient was seen by me on 06/22/2023. Her GYN had done an A1C and it came back at 6.5%. the plan was to start on ozempic  to help with comorbidities. She is here for a follow up  She was not able to get ozempic . She is on taltz and it causes her to feel nausea.   States that she is not eating out as much. States that she is drinking a lot of water. She does move around at work      Review of Systems  Constitutional:  Negative for chills and fever.  Respiratory:  Negative for shortness of breath.   Cardiovascular:  Negative for chest pain.  Neurological:  Negative for dizziness and headaches.      Objective:     BP 122/60   Pulse 76   Temp 97.7 F (36.5 C) (Oral)   Ht 5' 10 (1.778 m)   Wt 251 lb 6.4 oz (114 kg)   SpO2 97%   BMI 36.07 kg/m  BP Readings from Last 3 Encounters:  09/22/23 122/60  08/03/23 132/82  07/06/23 133/82   Wt Readings from Last 3 Encounters:  09/22/23 251 lb 6.4 oz (114 kg)  08/03/23 246 lb (111.6 kg)  07/06/23 246 lb 9.6 oz (111.9 kg)   SpO2 Readings from Last 3 Encounters:  09/22/23 97%  08/03/23 98%  06/22/23 98%      Physical Exam Vitals and nursing note reviewed.  Constitutional:      Appearance: Normal appearance.  Cardiovascular:     Rate and Rhythm: Normal rate and regular rhythm.     Heart sounds: Normal heart sounds.  Pulmonary:     Effort: Pulmonary effort is normal.     Breath sounds: Normal breath sounds.  Musculoskeletal:     Right lower leg: Edema present.     Left lower leg: Edema present.  Neurological:     Mental Status: She is alert.     Title   Diabetic Foot Exam - detailed Is there a history of foot ulcer?: No Is there a foot ulcer now?: No Is there swelling?: No Is there elevated skin temperature?: No Is  there abnormal foot shape?: No Is there a claw toe deformity?: No Are the toenails long?: No Are the toenails thick?: Yes Are the toenails ingrown?: No Pulse Foot Exam completed.: Yes   Right Posterior Tibialis: Present Left posterior Tibialis: Present   Right Dorsalis Pedis: Present Left Dorsalis Pedis: Present     Sensory Foot Exam Completed.: Yes Semmes-Weinstein Monofilament Test + means has sensation and - means no sensation      Image components are not supported.   Image components are not supported. Image components are not supported.  Tuning Fork Comments All 10 sites tested sensation intact    Results for orders placed or performed in visit on 09/22/23  POCT glycosylated hemoglobin (Hb A1C)  Result Value Ref Range   Hemoglobin A1C 6.1 (A) 4.0 - 5.6 %   HbA1c POC (<> result, manual entry)     HbA1c, POC (prediabetic range)     HbA1c, POC (controlled diabetic range)        The 10-year ASCVD risk score (Arnett DK, et al., 2019) is: 29.5%    Assessment & Plan:  Problem List Items Addressed This Visit       Cardiovascular and Mediastinum   Essential hypertension (Chronic)   Patient currently maintained on losartan  100 mg Daily and Maxide daily.  Blood pressure well-controlled.  Continue medication as prescribed      Relevant Orders   Basic metabolic panel with GFR   CBC     Endocrine   Controlled type 2 diabetes mellitus without complication, without long-term current use of insulin (HCC) - Primary   Patient is A1c is 6.1% today.  She has not picked up Ozempic  as of yet as she did not was ready.  Call pharmacy to verify patient can pick it up at any point in time.  Patient will start Ozempic  0.25 mg once a week for 4 weeks then titrate up to 0.5 mg weekly thereafter.  Signs and symptoms of hypoglycemia printed off for patient to review patient also sent in glucose monitoring equipment.  She will follow-up in 3 months sooner if she needs me       Relevant Medications   Blood Glucose Monitoring Suppl DEVI   Glucose Blood (BLOOD GLUCOSE TEST STRIPS) STRP   Lancet Device MISC   Lancets Misc. MISC   Other Relevant Orders   POCT glycosylated hemoglobin (Hb A1C) (Completed)   Basic metabolic panel with GFR   CBC   Microalbumin / creatinine urine ratio     Other   TOBACCO ABUSE   Relevant Orders   Urine Microscopic   Bilateral lower extremity edema   Has been seen by vascular in the past recommended compression socks which patient has not gotten she can get a pair at a DME store if she has a prescription she can reach out to me thereafter.  Patient to take these off at bedtime we will check basic labs inclusive of BNP.  Patient is already on Maxide      Relevant Orders   Brain natriuretic peptide    Return in about 3 months (around 12/23/2023) for BP recheck, DM recheck, edema.    Adina Crandall, NP

## 2023-09-22 NOTE — Patient Instructions (Signed)
 Nice to see you today  [employer] stores and look for medical grade compression socks Pick up the ozempic  Follow up with me in 3 months

## 2023-09-22 NOTE — Assessment & Plan Note (Signed)
 Has been seen by vascular in the past recommended compression socks which patient has not gotten she can get a pair at a DME store if she has a prescription she can reach out to me thereafter.  Patient to take these off at bedtime we will check basic labs inclusive of BNP.  Patient is already on Maxide

## 2023-09-23 LAB — MICROALBUMIN / CREATININE URINE RATIO
Creatinine, Urine: 49 mg/dL (ref 20–275)
Microalb Creat Ratio: 10 mg/g{creat} (ref <30)
Microalb, Ur: 0.5 mg/dL

## 2023-09-23 LAB — CBC
HCT: 37.3 % (ref 35.0–45.0)
Hemoglobin: 12.2 g/dL (ref 11.7–15.5)
MCH: 30.7 pg (ref 27.0–33.0)
MCHC: 32.7 g/dL (ref 32.0–36.0)
MCV: 93.7 fL (ref 80.0–100.0)
MPV: 10.9 fL (ref 7.5–12.5)
Platelets: 252 Thousand/uL (ref 140–400)
RBC: 3.98 Million/uL (ref 3.80–5.10)
RDW: 12.6 % (ref 11.0–15.0)
WBC: 8.6 Thousand/uL (ref 3.8–10.8)

## 2023-09-23 LAB — BASIC METABOLIC PANEL WITH GFR
BUN: 13 mg/dL (ref 7–25)
CO2: 32 mmol/L (ref 20–32)
Calcium: 9.1 mg/dL (ref 8.6–10.4)
Chloride: 101 mmol/L (ref 98–110)
Creat: 0.88 mg/dL (ref 0.50–1.05)
Glucose, Bld: 85 mg/dL (ref 65–99)
Potassium: 4.7 mmol/L (ref 3.5–5.3)
Sodium: 140 mmol/L (ref 135–146)
eGFR: 74 mL/min/1.73m2 (ref >=60)

## 2023-09-23 LAB — URINALYSIS, MICROSCOPIC ONLY
Bacteria, UA: NONE SEEN /HPF
Hyaline Cast: NONE SEEN /LPF
RBC / HPF: NONE SEEN /HPF (ref 0–2)
WBC, UA: NONE SEEN /HPF (ref 0–5)

## 2023-09-23 LAB — BRAIN NATRIURETIC PEPTIDE: Brain Natriuretic Peptide: 27 pg/mL (ref <100)

## 2023-09-27 ENCOUNTER — Ambulatory Visit: Payer: Self-pay | Admitting: Nurse Practitioner

## 2023-11-02 ENCOUNTER — Other Ambulatory Visit: Payer: Self-pay | Admitting: Internal Medicine

## 2023-11-02 ENCOUNTER — Other Ambulatory Visit: Payer: Self-pay | Admitting: Nurse Practitioner

## 2023-11-02 DIAGNOSIS — G4700 Insomnia, unspecified: Secondary | ICD-10-CM

## 2023-11-02 DIAGNOSIS — I10 Essential (primary) hypertension: Secondary | ICD-10-CM

## 2023-11-02 DIAGNOSIS — K219 Gastro-esophageal reflux disease without esophagitis: Secondary | ICD-10-CM

## 2023-11-04 ENCOUNTER — Other Ambulatory Visit: Payer: Self-pay | Admitting: Internal Medicine

## 2023-11-04 DIAGNOSIS — K219 Gastro-esophageal reflux disease without esophagitis: Secondary | ICD-10-CM

## 2023-11-11 ENCOUNTER — Other Ambulatory Visit: Payer: Self-pay | Admitting: Nurse Practitioner

## 2023-11-11 DIAGNOSIS — E119 Type 2 diabetes mellitus without complications: Secondary | ICD-10-CM

## 2023-11-27 ENCOUNTER — Other Ambulatory Visit: Payer: Self-pay | Admitting: Nurse Practitioner

## 2023-11-27 DIAGNOSIS — E119 Type 2 diabetes mellitus without complications: Secondary | ICD-10-CM

## 2023-11-27 NOTE — Telephone Encounter (Unsigned)
 Copied from CRM [id number]. Topic: Clinical - Medication Refill >> Nov 27, 2023 12:35 PM Armenia J wrote: Medication: Semaglutide ,0.25 or 0.5MG /DOS, (OZEMPIC , 0.25 OR 0.5 MG/DOSE,) 2 MG/3ML SOPN  Has the patient contacted their pharmacy? Yes (Agent: If no, request that the patient contact the pharmacy for the refill. If patient does not wish to contact the pharmacy document the reason why and proceed with request.) (Agent: If yes, when and what did the pharmacy advise?)  This is the patient's preferred pharmacy:  [facility] - [place], [place] - [address], [address], [address] [place] [address] Phone: [phone] Fax: [phone] Hours: Not open 24 hours  Is this the correct pharmacy for this prescription? Yes If no, delete pharmacy and type the correct one.   Has the prescription been filled recently? No  Is the patient out of the medication? Yes  Has the patient been seen for an appointment in the last year OR does the patient have an upcoming appointment? Yes  Can we respond through MyChart? Yes  Agent: Please be advised that Rx refills may take up to 3 business days. We ask that you follow-up with your pharmacy.

## 2023-11-29 ENCOUNTER — Other Ambulatory Visit: Payer: Self-pay | Admitting: Internal Medicine

## 2023-11-29 DIAGNOSIS — K219 Gastro-esophageal reflux disease without esophagitis: Secondary | ICD-10-CM

## 2023-11-30 ENCOUNTER — Other Ambulatory Visit: Payer: Self-pay | Admitting: Nurse Practitioner

## 2023-11-30 DIAGNOSIS — K219 Gastro-esophageal reflux disease without esophagitis: Secondary | ICD-10-CM

## 2023-11-30 DIAGNOSIS — E119 Type 2 diabetes mellitus without complications: Secondary | ICD-10-CM

## 2023-11-30 NOTE — Telephone Encounter (Unsigned)
 Copied from CRM #[id number]. Topic: Clinical - Medication Refill >> Nov 30, 2023 10:49 AM Drema MATSU wrote: Medication: Pantoprazole  Sodium 40 MG  Has the patient contacted their pharmacy? Yes (Agent: If no, request that the patient contact the pharmacy for the refill. If patient does not wish to contact the pharmacy document the reason why and proceed with request.) advised call provider  (Agent: If yes, when and what did the pharmacy advise?)  This is the patient's preferred pharmacy:  [facility] [address] [place], [place] - [address] [place] [address] Phone: [phone] Fax: [phone]  Is this the correct pharmacy for this prescription? Yes If no, delete pharmacy and type the correct one.   Has the prescription been filled recently? No  Is the patient out of the medication? Yes  Has the patient been seen for an appointment in the last year OR does the patient have an upcoming appointment? Yes  Can we respond through MyChart? Yes  Agent: Please be advised that Rx refills may take up to 3 business days. We ask that you follow-up with your pharmacy.

## 2023-11-30 NOTE — Telephone Encounter (Unsigned)
 Copied from CRM [id number]. Topic: Clinical - Medication Refill >> Nov 30, 2023 10:42 AM Suzen RAMAN wrote: Medication: Semaglutide ,0.25 or 0.5MG /DOS, (OZEMPIC , 0.25 OR 0.5 MG/DOSE,) 2 MG/3ML SOPN  Has the patient contacted their pharmacy? Yes   This is the patient's preferred pharmacy:    [facility] - [place], [place] - [address] [place] [address] Phone: [phone] Fax: [phone]   Is this the correct pharmacy for this prescription? Yes If no, delete pharmacy and type the correct one.   Has the prescription been filled recently? Yes; patient can't afford medication that was sent to her local pharmacy  Is the patient out of the medication? No  Has the patient been seen for an appointment in the last year OR does the patient have an upcoming appointment? Yes  Can we respond through MyChart? Yes  Agent: Please be advised that Rx refills may take up to 3 business days. We ask that you follow-up with your pharmacy.

## 2023-12-04 ENCOUNTER — Other Ambulatory Visit: Payer: Self-pay | Admitting: Nurse Practitioner

## 2023-12-04 DIAGNOSIS — Z1231 Encounter for screening mammogram for malignant neoplasm of breast: Secondary | ICD-10-CM

## 2023-12-06 ENCOUNTER — Telehealth: Payer: Self-pay | Admitting: Nurse Practitioner

## 2023-12-06 DIAGNOSIS — E119 Type 2 diabetes mellitus without complications: Secondary | ICD-10-CM

## 2023-12-06 MED ORDER — OZEMPIC (0.25 OR 0.5 MG/DOSE) 2 MG/3ML ~~LOC~~ SOPN: 0.5000 mg | PEN_INJECTOR | SUBCUTANEOUS | 0 refills | Status: DC

## 2023-12-06 NOTE — Telephone Encounter (Signed)
 Copied from CRM #[id number]. Topic: Clinical - Medication Refill >> Dec 06, 2023  4:21 PM Antwanette L wrote: Medication: Semaglutide ,0.25 or 0.5MG /DOS, (OZEMPIC , 0.25 OR 0.5 MG/DOSE,) 2 MG/3ML SOPN   Has the patient contacted their pharmacy? Yes  This is the patient's preferred pharmacy:   [facility] - [place], [place] - [address] [place] [address] Phone: [phone] Fax: [phone]    Is this the correct pharmacy for this prescription? Yes   Has the prescription been filled recently? Yes. Last refill was on 11/13/23  Is the patient out of the medication? Yes  Has the patient been seen for an appointment in the last year OR does the patient have an upcoming appointment? Yes. Last ov w/ Lynwood Crandall NP was on 09/22/23 and next appt is 12/27/23  Can we respond through MyChart? No. Contact the pt by phone at [phone]  Agent: Please be advised that Rx refills may take up to 3 business days. We ask that you follow-up with your pharmacy.

## 2023-12-06 NOTE — Telephone Encounter (Signed)
 Copied from CRM #[id number]. Topic: Clinical - Medication Refill >> Dec 06, 2023  4:24 PM Antwanette L wrote: The patient is requesting a 54-month supply of their medication, as it is cheaper

## 2023-12-07 MED ORDER — PANTOPRAZOLE SODIUM 40 MG PO TBEC: 40.0000 mg | DELAYED_RELEASE_TABLET | Freq: Every day | ORAL | 0 refills | Status: DC

## 2023-12-08 ENCOUNTER — Encounter: Payer: Self-pay | Admitting: Cardiology

## 2023-12-08 ENCOUNTER — Ambulatory Visit: Attending: Cardiology | Admitting: Cardiology

## 2023-12-08 VITALS — BP 137/66 | HR 68 | Ht 69.0 in | Wt 253.6 lb

## 2023-12-08 DIAGNOSIS — I2510 Atherosclerotic heart disease of native coronary artery without angina pectoris: Secondary | ICD-10-CM | POA: Diagnosis not present

## 2023-12-08 DIAGNOSIS — F17200 Nicotine dependence, unspecified, uncomplicated: Secondary | ICD-10-CM | POA: Diagnosis not present

## 2023-12-08 DIAGNOSIS — I10 Essential (primary) hypertension: Secondary | ICD-10-CM

## 2023-12-08 DIAGNOSIS — E7800 Pure hypercholesterolemia, unspecified: Secondary | ICD-10-CM

## 2023-12-08 NOTE — Patient Instructions (Signed)

## 2023-12-08 NOTE — Progress Notes (Signed)
 Cardiology Office Note:    Date:  12/08/2023   ID:  Darlene Gonzalez, DOB [birth year], MRN [id number]  PCP:  Wendee Lynwood HERO, NP   [facility] Providers Cardiologist:  Redell Cave, MD     Referring MD: Wendee Lynwood HERO, NP   Chief Complaint  Patient presents with   Follow-up    6 month follow up pt has been doing well with no complaints of chest pain, chest pressure or SOB, medciation reviewed verbally with patient    History of Present Illness:    Darlene Gonzalez is a 63 y.o. female with a hx of CAD (RCA calcifications on chest CT ), hypertension, hyperlipidemia, current smoker x 40+ years who presents for follow-up.  Feels well, denies chest pain or shortness of breath, compliant with medications as prescribed.  Cholesterol levels improving from prior.  Plans to obtain repeat lipid panel with PCP next month.  Still smokes, is working on quitting.  Prior notes/testing Patient had a chest CT lung cancer screening 12/2022, aortic atherosclerosis and RCA calcifications noted. Only tolerate simvastatin .  Did not tolerate order forms of statins.  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 Swallow whole.   Blood Glucose Monitoring Suppl DEVI 1 each by Does not apply route daily. May substitute to any manufacturer covered by patient's insurance.   cetirizine  (ZYRTEC  ALLERGY) 10 MG tablet Take 1 tablet (10 mg total) by mouth daily.   clobetasol  ointment (TEMOVATE ) 0.05 % Apply 0.5 grams to the vulvar skin twice daily for 2 weeks for 1 month and then 0.5 grams daily for another 1 months.  Then apply 0.5 grams topically twice weekly.   fluconazole  (DIFLUCAN ) 150 MG tablet Take 1 tablet (150 mg total) by mouth every other day as needed.   fluocinonide ointment (LIDEX) 0.05 % Apply 1 Application topically 2 (two) times daily.   Glucose Blood (BLOOD GLUCOSE TEST STRIPS) STRP 1 each by In Vitro route daily. May substitute to any manufacturer covered by patient's insurance.   ketoconazole (NIZORAL) 2 % shampoo Apply topically.   Lancets Misc. MISC 1 each by Does not apply route daily. May substitute to any manufacturer covered by patient's insurance.   losartan  (COZAAR ) 100 MG tablet Take 1 tablet by mouth once daily   Miconazole  Nitrate 2 % POWD 1 Application by Does not apply route 3 (three) times daily as needed. Apply under breasts three times  daily as needed   nystatin  cream (MYCOSTATIN ) Apply 1 Application topically 2 (two) times daily.   pantoprazole  (PROTONIX ) 40 MG tablet Take 1 tablet (40 mg total) by mouth daily. Office visit for further refills   Semaglutide ,0.25 or 0.5MG /DOS, (OZEMPIC , 0.25 OR 0.5 MG/DOSE,) 2 MG/3ML SOPN Inject 0.5 mg into the skin once a week.   simvastatin  (ZOCOR ) 40 MG tablet Take 1 tablet (40 mg total) by mouth daily.   TALTZ 80 MG/ML pen    triamterene -hydrochlorothiazide (MAXZIDE-25) 37.5-25 MG tablet Take 1 tablet by mouth daily.   Vitamin D , Ergocalciferol , (DRISDOL ) 1.25 MG (50000 UNIT) CAPS capsule Take 1 capsule (50,000 Units total) by mouth every 7 (seven) days.   zolpidem  (AMBIEN ) 10 MG  tablet TAKE 1 TABLET BY MOUTH AT BEDTIME AS NEEDED FOR SLEEP     Allergies:   Crestor  [rosuvastatin ] and Lisinopril    Social History   Socioeconomic History   Marital status: Single    Spouse name: Not on file   Number of children: 0   Years of education: 12th grade   Highest education level: Not on file  Occupational History   Occupation: Holiday representative: [employer]   Occupation: Deli    Employer: [id number]  Tobacco Use   Smoking status: Every Day    Current packs/day: 0.50    Average packs/day: 0.5 packs/day for 41.0 years (20.5 ttl pk-yrs)    Types: Cigarettes   Smokeless tobacco: Never  Vaping Use   Vaping status: Never Used  Substance and Sexual Activity   Alcohol use: Yes    Alcohol/week: 12.0 standard drinks of alcohol    Types: 12 Cans of beer per week    Comment: 1 twelve pack a weekend   Drug use: No   Sexual activity: Yes    Partners: Male    Birth control/protection: Surgical    Comment: hysterectomy  Other Topics Concern   Not on file  Social History Narrative   Lives by herself, has stable partner   Right handed   Social Drivers of Corporate investment banker Strain: Not on file  Food Insecurity: Not on file  Transportation Needs: Not on file  Physical Activity: Not on file  Stress: Not on file  Social Connections: Not on file     Family History: The patient's family history includes Alcohol abuse in her father; Bladder Cancer in her sister; Diabetes in her maternal aunt; Healthy in her mother; Hypertension in her father; Kidney disease in her maternal aunt; Liver disease in her father; Lung cancer in her sister; Thyroid  disease in her mother.  ROS:   Please see the history of present illness.     All other systems reviewed and are negative.  EKGs/Labs/Other Studies Reviewed:    The following studies were reviewed today:  EKG Interpretation Date/Time:  Friday December 08 2023 15:20:31 EDT Ventricular Rate:  68 PR  Interval:  152 QRS Duration:  64 QT Interval:  424 QTC Calculation: 450 R Axis:   37  Text Interpretation: Normal sinus rhythm Normal ECG Confirmed by Darliss Rogue ([id number]) on 12/08/2023 3:33:56 PM    Recent Labs: 02/13/2023: ALT 17; TSH 2.00 09/22/2023: Brain Natriuretic Peptide 27; BUN 13; Creat 0.88; Hemoglobin 12.2; Platelets 252; Potassium 4.7; Sodium 140  Recent Lipid Panel    Component Value Date/Time   CHOL 181 02/13/2023 1530   TRIG 89.0 02/13/2023 1530   HDL 45.90 02/13/2023 1530   CHOLHDL 4 02/13/2023 1530   VLDL  17.8 02/13/2023 1530   LDLCALC 117 (H) 02/13/2023 1530   LDLCALC 160 (H) 01/29/2021 1532   LDLDIRECT 147 (H) 02/01/2014 0940     Risk Assessment/Calculations:         Physical Exam:    VS:  BP 137/66 (BP Location: Left Arm, Patient Position: Sitting, Cuff Size: Large)   Pulse 68   Ht 5' 9 (1.753 m)   Wt 253 lb 9.6 oz (115 kg)   SpO2 99%   BMI 37.45 kg/m     Wt Readings from Last 3 Encounters:  12/08/23 253 lb 9.6 oz (115 kg)  09/22/23 251 lb 6.4 oz (114 kg)  08/03/23 246 lb (111.6 kg)     GEN:  Well nourished, well developed in no acute distress HEENT: Normal NECK: No JVD; No carotid bruits CARDIAC: RRR, no murmurs, rubs, gallops RESPIRATORY: Diminished breath sounds, no wheezing ABDOMEN: Soft, non-tender, non-distended MUSCULOSKELETAL:  trace edema; No deformity  SKIN: Warm and dry NEUROLOGIC:  Alert and oriented x 3 PSYCHIATRIC:  Normal affect   ASSESSMENT:    1. Coronary artery disease involving native coronary artery of native heart, unspecified whether angina present   2. Primary hypertension   3. Pure hypercholesterolemia   4. Current smoker    PLAN:    In order of problems listed above:  CAD/RCA coronary calcification on chest CT 12/2022.  Echo 02/2023 EF 60 to 65%, mild MR. Denies chest pain.  Continue aspirin  81 mg daily, simvastatin  40 mg daily.  Did not tolerate Crestor  in the past.  Hypertension, BP controlled.   Continue losartan  100 mg daily, Maxzide-25 milligram daily daily.   Hyperlipidemia, continue simvastatin  40 mg daily. Current smoker, smoking cessation advised.  Follow-up in 12 months.    Medication Adjustments/Labs and Tests Ordered: Current medicines are reviewed at length with the patient today.  Concerns regarding medicines are outlined above.  Orders Placed This Encounter  Procedures   EKG 12-Lead   No orders of the defined types were placed in this encounter.   Patient Instructions  Medication Instructions:  Your physician recommends that you continue on your current medications as directed. Please refer to the Current Medication list given to you today.   *If you need a refill on your cardiac medications before your next appointment, please call your pharmacy*  Lab Work: No labs ordered today  If you have labs (blood work) drawn today and your tests are completely normal, you will receive your results only by: MyChart Message (if you have MyChart) OR A paper copy in the mail If you have any lab test that is abnormal or we need to change your treatment, we will call you to review the results.  Testing/Procedures: No test ordered today   Follow-Up: At [facility], you and your health needs are our priority.  As part of our continuing mission to provide you with exceptional heart care, our providers are all part of one team.  This team includes your primary Cardiologist (physician) and Advanced Practice Providers or APPs (Physician Assistants and Nurse Practitioners) who all work together to provide you with the care you need, when you need it.  Your next appointment:   1 year(s)  Provider:   You may see Redell Cave, MD or one of the following Advanced Practice Providers on your designated Care Team:   Lonni Meager, NP Lesley Maffucci, PA-C Bernardino Bring, PA-C Cadence Oakley, PA-C Tylene Lunch, NP Barnie Hila, NP    We recommend signing up for  the patient portal called MyChart.  Sign up information is provided on this After Visit Summary.  MyChart is used to connect with patients for Virtual Visits (Telemedicine).  Patients are able to view lab/test results, encounter notes, upcoming appointments, etc.  Non-urgent messages can be sent to your provider as well.   To learn more about what you can do with MyChart, go to [email or web address].             Signed, Redell Cave, MD  12/08/2023 4:10 PM    [facility]

## 2023-12-13 ENCOUNTER — Ambulatory Visit (INDEPENDENT_AMBULATORY_CARE_PROVIDER_SITE_OTHER)

## 2023-12-13 ENCOUNTER — Ambulatory Visit: Admitting: Family Medicine

## 2023-12-13 ENCOUNTER — Other Ambulatory Visit: Payer: Self-pay

## 2023-12-13 ENCOUNTER — Encounter: Payer: Self-pay | Admitting: Family Medicine

## 2023-12-13 VITALS — BP 130/70 | HR 76 | Ht 69.0 in | Wt 251.0 lb

## 2023-12-13 DIAGNOSIS — M25562 Pain in left knee: Secondary | ICD-10-CM

## 2023-12-13 DIAGNOSIS — G8929 Other chronic pain: Secondary | ICD-10-CM

## 2023-12-13 NOTE — Progress Notes (Unsigned)
   I, Leotis Batter, CMA acting as a scribe for Artist Lloyd, MD.  Darlene Gonzalez is a 63 y.o. female who presents to [facility] today for L knee pain. Pt was previously seen by Dr. Lloyd on 08/17/23 for R knee pain.  Today, pt reports pain and swelling of the left knee x 2 weeks. Sx have improved some since onset. Notes throbbing sensation at the lateral aspect of the knee.   Pertinent review of systems: No fevers or chills  Relevant historical information: Hypertension diabetes.   Exam:  BP 130/70   Pulse 76   Ht 5' 9 (1.753 m)   Wt 251 lb (113.9 kg)   SpO2 98%   BMI 37.07 kg/m  General: Well Developed, well nourished, and in no acute distress.   MSK: Left knee mild effusion Normal motion. Tender palpation lateral joint line. Stable ligamentous exam.    Lab and Radiology Results  Procedure: Real-time Ultrasound Guided Injection of left knee joint superior lateral patella space Device: Philips Affiniti 50G/GE Logiq Images permanently stored and available for review in PACS Verbal informed consent obtained.  Discussed risks and benefits of procedure. Warned about infection, bleeding, hyperglycemia damage to structures among others. Patient expresses understanding and agreement Time-out conducted.   Noted no overlying erythema, induration, or other signs of local infection.   Skin prepped in a sterile fashion.   Local anesthesia: Topical Ethyl chloride.   With sterile technique and under real time ultrasound guidance: 40 mg of Depo-Medrol  and 2 mL of Marcaine injected into knee joint. Fluid seen entering the joint capsule.   Completed without difficulty   Pain immediately resolved suggesting accurate placement of the medication.   Advised to call if fevers/chills, erythema, induration, drainage, or persistent bleeding.   Images permanently stored and available for review in the ultrasound unit.  Impression: Technically successful  ultrasound guided injection.   X-ray images left knee obtained today personally and independently interpreted Mild lateral DJD. Await formal radiology review      Assessment and Plan: 63 y.o. female with left knee pain due to exacerbation of DJD and concern for lateral meniscus tear.  Plan for steroid injection today.  Check back as needed.  Recommend Voltaren  gel and Tylenol  for pain additionally. Recommend avoiding high dose oral ibuprofen  or other NSAIDs given hypertension and diabetes risk.  PDMP not reviewed this encounter. Orders Placed This Encounter  Procedures   US  LIMITED JOINT SPACE STRUCTURES LOW LEFT(NO LINKED CHARGES)    Reason for Exam (SYMPTOM  OR DIAGNOSIS REQUIRED):   left knee pain    Preferred imaging location?:   [facility] Knee AP/LAT W/[place] Left    Standing Status:   Future    Number of Occurrences:   1    Expiration Date:   01/13/2024    Reason for Exam (SYMPTOM  OR DIAGNOSIS REQUIRED):   left knee pain and swelling    Preferred imaging location?:   [facility]   No orders of the defined types were placed in this encounter.    Discussed warning signs or symptoms. Please see discharge instructions. Patient expresses understanding.   The above documentation has been reviewed and is accurate and complete Artist Lloyd, M.D.

## 2023-12-13 NOTE — Patient Instructions (Addendum)
 Thank you for coming in today.   Please get an Xray today before you leave   You received an injection today. Seek immediate medical attention if the joint becomes red, extremely painful, or is oozing fluid.   See you back as needed.

## 2023-12-15 ENCOUNTER — Telehealth: Payer: Self-pay

## 2023-12-15 NOTE — Telephone Encounter (Signed)
FMLA form received

## 2023-12-18 ENCOUNTER — Ambulatory Visit: Payer: Self-pay | Admitting: Family Medicine

## 2023-12-18 NOTE — Progress Notes (Signed)
 Left knee x-ray shows mild arthritis.

## 2023-12-20 NOTE — Telephone Encounter (Addendum)
 Form completed, reviewed and signed by Dr. Joane, faxed, and placed at the front desk for scanning.

## 2023-12-20 NOTE — Telephone Encounter (Signed)
 Need to confirm FMLA needs for pt: Intermittent time off Continuous leave Workplace accommodations  Called pt, left VM to call the office.

## 2023-12-20 NOTE — Telephone Encounter (Addendum)
 Spoke with patient, will plan for intermittent leave for sx exacerbation and time off to attend appointments.   Frequency - 2-3 x per month lasting 1-2 days each episode.   Advised pt that form would be faxed back by end of business day today.

## 2023-12-25 ENCOUNTER — Encounter: Payer: Self-pay | Admitting: Radiology

## 2023-12-27 ENCOUNTER — Ambulatory Visit: Admitting: Nurse Practitioner

## 2023-12-27 VITALS — BP 124/60 | HR 80 | Temp 98.0°F | Ht 69.0 in | Wt 245.8 lb

## 2023-12-27 DIAGNOSIS — Z7985 Long-term (current) use of injectable non-insulin antidiabetic drugs: Secondary | ICD-10-CM | POA: Diagnosis not present

## 2023-12-27 DIAGNOSIS — Z23 Encounter for immunization: Secondary | ICD-10-CM | POA: Diagnosis not present

## 2023-12-27 DIAGNOSIS — E119 Type 2 diabetes mellitus without complications: Secondary | ICD-10-CM | POA: Diagnosis not present

## 2023-12-27 DIAGNOSIS — G4700 Insomnia, unspecified: Secondary | ICD-10-CM

## 2023-12-27 DIAGNOSIS — R1012 Left upper quadrant pain: Secondary | ICD-10-CM

## 2023-12-27 LAB — POCT GLYCOSYLATED HEMOGLOBIN (HGB A1C): Hemoglobin A1C: 5.9 % — AB (ref 4.0–5.6)

## 2023-12-27 MED ORDER — ZOLPIDEM TARTRATE 10 MG PO TABS
10.0000 mg | ORAL_TABLET | Freq: Every evening | ORAL | 2 refills | Status: AC | PRN
Start: 2023-12-27 — End: ?

## 2023-12-27 NOTE — Progress Notes (Signed)
 Established Patient Office Visit  Subjective   Patient ID: Darlene Gonzalez, female    DOB: Apr 15, 1960  Age: 63 y.o. MRN: [id number]  Chief Complaint  Patient presents with   Follow-up    HPI  Discussed the use of AI scribe software for clinical note transcription with the patient, who gave verbal consent to proceed.  History of Present Illness Darlene Gonzalez is a 63 year old female with type 2 diabetes who presents for follow-up on Ozempic  treatment.  She is currently on a 0.5 mg dose of Ozempic  for her type 2 diabetes management. She has not experienced significant side effects from the medication, although she has not experienced weight loss. She has lost six pounds since starting the medication. No hypoglycemic symptoms such as shakiness or jitteriness. She sometimes feels full quickly but tends to continue eating.  Her current diet includes a small cup of cereal in the morning and occasional chips during the day due to a busy schedule. She reports not snacking much and has reduced late-night eating. She has not experienced constipation with Ozempic  and has not received a flu shot this season.  She reports a recent episode of vomiting water after consuming three regular Pepsi sodas and a snack, followed by water. This occurred while she was sitting and watching TV. She has experienced this once before starting Ozempic . She also reports occasional abdominal pain, particularly on the left side. She is currently taking Protonix  (pantoprazole ) for gastrointestinal symptoms.  Her A1c has decreased from 6.1% to 5.9%. She is not currently on metformin . She takes B12 supplements, although her last B12 level was normal at 800 pg/mL. She also takes vitamin D  supplements.    Review of Systems  Constitutional:  Negative for chills and fever.  Respiratory:  Negative for shortness of breath.   Cardiovascular:  Negative for chest pain.  Gastrointestinal:  Positive for abdominal pain and  vomiting. Negative for constipation and nausea.  Neurological:  Negative for headaches.      Objective:     BP 124/60   Pulse 80   Temp 98 F (36.7 C) (Oral)   Ht 5' 9 (1.753 m)   Wt 245 lb 12.8 oz (111.5 kg)   SpO2 99%   BMI 36.30 kg/m  BP Readings from Last 3 Encounters:  12/27/23 124/60  12/13/23 130/70  12/08/23 137/66   Wt Readings from Last 3 Encounters:  12/27/23 245 lb 12.8 oz (111.5 kg)  12/13/23 251 lb (113.9 kg)  12/08/23 253 lb 9.6 oz (115 kg)   SpO2 Readings from Last 3 Encounters:  12/27/23 99%  12/13/23 98%  12/08/23 99%      Physical Exam Vitals and nursing note reviewed.  Constitutional:      Appearance: Normal appearance.  Cardiovascular:     Rate and Rhythm: Normal rate and regular rhythm.     Heart sounds: Normal heart sounds.  Pulmonary:     Effort: Pulmonary effort is normal.     Breath sounds: Normal breath sounds.  Abdominal:     General: Bowel sounds are normal.     Tenderness: There is abdominal tenderness.  Neurological:     Mental Status: She is alert.      Results for orders placed or performed in visit on 12/27/23  POCT glycosylated hemoglobin (Hb A1C)  Result Value Ref Range   Hemoglobin A1C 5.9 (A) 4.0 - 5.6 %   HbA1c POC (<> result, manual entry)     HbA1c, POC (prediabetic  range)     HbA1c, POC (controlled diabetic range)        The 10-year ASCVD risk score (Arnett DK, et al., 2019) is: 31.9%    Assessment & Plan:   Problem List Items Addressed This Visit       Endocrine   Controlled type 2 diabetes mellitus without complication, without long-term current use of insulin (HCC) - Primary   Relevant Orders   POCT glycosylated hemoglobin (Hb A1C) (Completed)     Other   Insomnia   Relevant Medications   zolpidem  (AMBIEN ) 10 MG tablet   Other Visit Diagnoses       LUQ abdominal pain       Relevant Orders   Lipase      Assessment and Plan Assessment & Plan Type 2 diabetes mellitus on Ozempic   therapy Ozempic  0.5 mg improved glycemic control with weight loss and A1c reduction. No significant side effects reported, though vomiting episode noted. Appetite control observed. - Continue Ozempic  0.5 mg for two more months. - Monitor for any side effects, especially gastrointestinal. - Encourage portion control and mindful eating habits. - Reassess A1c and weight in three months.   Gastrointestinal symptoms (episodic vomiting and abdominal tenderness) Episodic vomiting and abdominal tenderness may relate to overfilling the stomach or pancreatitis, possibly exacerbated by Ozempic . Protonix  use may affect vitamin B12 absorption. - Check lipase levels to rule out pancreatitis. - Continue Protonix  for gastroesophageal reflux disease. - Advise on portion control to prevent overfilling the stomach. - Monitor for recurrent vomiting or abdominal pain.  Bilateral lower extremity edema Not discussed this visit. Previous labs normal, no acute cardiac issues.  Vitamin D  deficiency, status post supplementation Previously identified and supplemented. Current status not discussed this visit.  General Health Maintenance Ensure vaccinations and screenings are up to date. - Administer flu shot today. - Ensure colonoscopy is completed in January. - Ensure mammogram is completed in 01/17/2024  Return in about 3 months (around 03/28/2024) for CPE and Labs.    Adina Crandall, NP

## 2023-12-27 NOTE — Patient Instructions (Signed)
 Nice to see you today Continue the ozempic  0.5mg  weekly We did update your flu vaccine today  I will be I touch with the labs once I have them Follow up with me in 3 months, sooner if you need me

## 2023-12-27 NOTE — Addendum Note (Signed)
 Addended by: SEBASTIAN SHU on: 12/27/2023 03:49 PM   Modules accepted: Orders

## 2023-12-28 LAB — LIPASE: Lipase: 44 U/L (ref 11.0–59.0)

## 2024-01-01 ENCOUNTER — Ambulatory Visit: Payer: Self-pay | Admitting: Nurse Practitioner

## 2024-01-16 ENCOUNTER — Ambulatory Visit: Admitting: Nurse Practitioner

## 2024-01-17 ENCOUNTER — Ambulatory Visit
Admission: RE | Admit: 2024-01-17 | Discharge: 2024-01-17 | Disposition: A | Source: Ambulatory Visit | Attending: Nurse Practitioner

## 2024-01-17 DIAGNOSIS — Z1231 Encounter for screening mammogram for malignant neoplasm of breast: Secondary | ICD-10-CM

## 2024-01-23 ENCOUNTER — Telehealth: Payer: Self-pay | Admitting: Family Medicine

## 2024-01-23 NOTE — Telephone Encounter (Signed)
 Patient is scheduled for bilateral gel injections on 02/28/23. Has used Gelsyn in the past.  FYI for authorization at first of the year?

## 2024-01-25 ENCOUNTER — Ambulatory Visit: Payer: Self-pay | Admitting: Nurse Practitioner

## 2024-02-23 ENCOUNTER — Telehealth: Payer: Self-pay

## 2024-02-23 NOTE — Telephone Encounter (Signed)
 Gelsyn benefits ran for bilateral knee case ID [id number]

## 2024-02-26 NOTE — Telephone Encounter (Signed)
 Had to fill out a prior auth form and send in clinicals

## 2024-02-27 ENCOUNTER — Telehealth: Payer: Self-pay

## 2024-02-27 ENCOUNTER — Encounter: Payer: Self-pay | Admitting: Internal Medicine

## 2024-02-27 DIAGNOSIS — T884XXD Failed or difficult intubation, subsequent encounter: Secondary | ICD-10-CM

## 2024-02-27 DIAGNOSIS — Z860100 Personal history of colon polyps, unspecified: Secondary | ICD-10-CM

## 2024-02-27 NOTE — Telephone Encounter (Signed)
 Good afternoon,   Upon chart prepping, under snapshot, it is documented difficult airway for intubation. Pt is scheduled for colonoscopy with Dr. Abran on 03/18/2024.  Please advise if procedure can de done at [facility] or admit to hospital. Thank you.

## 2024-02-28 ENCOUNTER — Ambulatory Visit: Admitting: Family Medicine

## 2024-02-28 ENCOUNTER — Other Ambulatory Visit: Payer: Self-pay

## 2024-02-28 DIAGNOSIS — Z1211 Encounter for screening for malignant neoplasm of colon: Secondary | ICD-10-CM

## 2024-02-28 NOTE — Telephone Encounter (Signed)
 RN called to inform patient of new date, time, and location of colonoscopy.  PER PATIENT REQUEST WOULD LIKE ENDOSCOPY DONE THE SAME DAY AS WELL. STATES IT WAS DONE 10 YEARS AGO AND NEEDS TO BE DONE AGAIN. PATIENT STATED SHE IS ONLY OFF ON MONDAYS, SO SHE WANTS AN EARLY MONDAY MORNING APPT. AT THE HOSPITAL.  POD A nurse please call back patient with new date and time of procedure and if endoscopy can be done the same day. Thank you.

## 2024-02-28 NOTE — Telephone Encounter (Signed)
 1.  Can add EGD only if she is having relevant symptoms such as dysphagia.  Otherwise, only colonoscopy 2.  I cannot help her with the Monday thing.

## 2024-02-28 NOTE — Telephone Encounter (Signed)
 Pt asking to have egd added to hospital colon. See note below and advise if ok to add egd.

## 2024-02-28 NOTE — Telephone Encounter (Signed)
 Please see TE from Darlene Gonzalez.  Please schedule at hospital and RN will notify patient of new date and time for colonoscopy. Thank you.

## 2024-02-28 NOTE — Telephone Encounter (Signed)
 Pt scheduled at [facility] for colon with Dr. Abran 04/03/24 at 10:15am. Rjdz#[id number].

## 2024-02-29 NOTE — Telephone Encounter (Signed)
 Patient states she is not having difficulty swallowing but she does still have acid reflux. She wanted to know since she has to have the procedure at the hospital if she would get a bill from [facility]. Discussed with pt that she would . Pt states she cannot afford this and would like to be referred to [facility], she states they do not add on all the extra charges. Please advise.

## 2024-02-29 NOTE — Telephone Encounter (Signed)
 We do not make referrals to other gastroenterologist.  She, or her PCP, will need to do that

## 2024-02-29 NOTE — Telephone Encounter (Signed)
 Left message for pt to call back. Left message that unless she is having problems such as dysphagia the egd cannot be done only the colon. Also left message we may have to switch providers if she can only do Mondays. Will await further communication from pt.

## 2024-02-29 NOTE — Telephone Encounter (Signed)
 Left message for pt to call back

## 2024-02-29 NOTE — Telephone Encounter (Signed)
 PT is requesting to have nurse call her back between 1-130pm when she is on lunch break.

## 2024-02-29 NOTE — Telephone Encounter (Signed)
 Spoke with pt and she is aware and knows to contact her PCP for referral. Appt at hospital cancelled.

## 2024-03-01 ENCOUNTER — Telehealth: Payer: Self-pay | Admitting: Nurse Practitioner

## 2024-03-01 DIAGNOSIS — E119 Type 2 diabetes mellitus without complications: Secondary | ICD-10-CM

## 2024-03-01 DIAGNOSIS — Z1211 Encounter for screening for malignant neoplasm of colon: Secondary | ICD-10-CM

## 2024-03-01 MED ORDER — SEMAGLUTIDE (1 MG/DOSE) 4 MG/3ML ~~LOC~~ SOPN: 1.0000 mg | PEN_INJECTOR | SUBCUTANEOUS | 1 refills | Status: DC

## 2024-03-01 NOTE — Telephone Encounter (Signed)
 Copied from CRM [id number]. Topic: Referral - Request for Referral >> Mar 01, 2024 10:21 AM Amy B wrote: Did the patient discuss referral with their provider in the last year? No (If No - schedule appointment) (If Yes - send message)  Appointment offered? No  Type of order/referral and detailed reason for visit: Gastroenterology  Preference of office, provider, location: [facility]  If referral order, have you been seen by this specialty before? No (If Yes, this issue or another issue? When? Where?  Can we respond through MyChart? Yes

## 2024-03-01 NOTE — Telephone Encounter (Signed)
 Referral placed and 1 mg ozempic  sent to mail order pharmacy

## 2024-03-01 NOTE — Telephone Encounter (Signed)
 Called and spoke with patient.  Pt needs referral for her colonoscopy that is overdue.  Pt needs [facility] because she has had problems getting tube down throat so she has to be in the hospital to get the procedure done.     Pt also states that she is out of Ozempic  and is ready for the increased dosage.

## 2024-03-01 NOTE — Telephone Encounter (Signed)
 Can we find out what she is needing a referral for?

## 2024-03-04 ENCOUNTER — Encounter: Payer: Self-pay | Admitting: Acute Care

## 2024-03-04 ENCOUNTER — Other Ambulatory Visit: Payer: Self-pay | Admitting: Nurse Practitioner

## 2024-03-04 DIAGNOSIS — E785 Hyperlipidemia, unspecified: Secondary | ICD-10-CM

## 2024-03-05 ENCOUNTER — Encounter

## 2024-03-06 ENCOUNTER — Telehealth: Payer: Self-pay

## 2024-03-06 ENCOUNTER — Other Ambulatory Visit: Payer: Self-pay

## 2024-03-06 DIAGNOSIS — F17210 Nicotine dependence, cigarettes, uncomplicated: Secondary | ICD-10-CM

## 2024-03-06 DIAGNOSIS — Z87891 Personal history of nicotine dependence: Secondary | ICD-10-CM

## 2024-03-06 DIAGNOSIS — Z122 Encounter for screening for malignant neoplasm of respiratory organs: Secondary | ICD-10-CM

## 2024-03-06 NOTE — Telephone Encounter (Signed)
 LVM to call [facility] line directly and schedule annual LDCT. New order placed.

## 2024-03-06 NOTE — Telephone Encounter (Signed)
 Copied from CRM #[id number]. Topic: Clinical - Request for Lab/Test Order >> Mar 04, 2024  8:52 AM Darlene Gonzalez wrote: Reason for CRM: Patient is requesting an order for annual lung cancer screening, and states she has been waiting since November to get this scheduled and states she has been waiting for [facility] to call her back for two weeks, patient is frustrated but is requesting someone to call her ASAP so that she may schedule.  Please advise

## 2024-03-06 NOTE — Telephone Encounter (Signed)
 Please schedule patient when medication is stocked  Gelsyn authorized for Bilateral knee PRE CERT REQUIRED Covered at 75% of allowable amount Deductible $3250 has met $0 OOP MAX $7000 has met $9.51 Copay $ 65 Once OOP is met coverage goes to 100% and copay will no longer apply PA APPROVED  AUTHORIZATION # [id number] 02/27/24-08/26/24

## 2024-03-07 ENCOUNTER — Other Ambulatory Visit: Payer: Self-pay | Admitting: Internal Medicine

## 2024-03-07 DIAGNOSIS — K219 Gastro-esophageal reflux disease without esophagitis: Secondary | ICD-10-CM

## 2024-03-07 NOTE — Telephone Encounter (Signed)
 Left message for patient to call back to schedule.

## 2024-03-14 ENCOUNTER — Other Ambulatory Visit: Payer: Self-pay

## 2024-03-14 ENCOUNTER — Ambulatory Visit: Admitting: Family Medicine

## 2024-03-14 ENCOUNTER — Encounter: Payer: Self-pay | Admitting: Family Medicine

## 2024-03-14 DIAGNOSIS — G8929 Other chronic pain: Secondary | ICD-10-CM

## 2024-03-14 DIAGNOSIS — M25561 Pain in right knee: Secondary | ICD-10-CM

## 2024-03-14 DIAGNOSIS — M25562 Pain in left knee: Secondary | ICD-10-CM | POA: Diagnosis not present

## 2024-03-14 DIAGNOSIS — M170 Bilateral primary osteoarthritis of knee: Secondary | ICD-10-CM

## 2024-03-14 MED ORDER — SODIUM HYALURONATE (VISCOSUP) 16.8 MG/2ML IX SOSY
16.8000 mg | PREFILLED_SYRINGE | Freq: Once | INTRA_ARTICULAR | Status: AC
  Administered 2024-03-14: 16.8 mg via INTRA_ARTICULAR

## 2024-03-14 NOTE — Patient Instructions (Addendum)
 Thank you for coming in today.   You received an injection today. Seek immediate medical attention if the joint becomes red, extremely painful, or is oozing fluid.

## 2024-03-14 NOTE — Progress Notes (Unsigned)
 "        I, Leotis Batter, CMA acting as a scribe for Artist Lloyd, MD.  Darlene Gonzalez is a 64 y.o. female who presents to [facility] at [facility] today for cont'd L knee pain. Pt was last seen by Dr. Lloyd on 12/13/23 and was given a L knee steroid injection and advised to use Tylenol  and Voltaren  gel.  Today, pt reports exacerbation of left knee OA. Sx have responded well to Gelsyn injection for the right knee in the past.   Dx testing: 12/13/23 L knee XR  Pertinent review of systems: No fevers or chills  Relevant historical information: Hypertension bilateral knee DJD.   Exam:   General: Well Developed, well nourished, and in no acute distress.   MSK: Bilateral knee mild effusion.    Lab and Radiology Results  Darlene Gonzalez presents to clinic today for Gelsyn injection bilateral knee 1/3 Procedure: Real-time Ultrasound Guided Injection of right knee joint superior lateral patellar space Device: Philips Affiniti 50G/GE Logiq Images permanently stored and available for review in PACS Verbal informed consent obtained.  Discussed risks and benefits of procedure. Warned about infection, bleeding, damage to structures among others. Patient expresses understanding and agreement Time-out conducted.   Noted no overlying erythema, induration, or other signs of local infection.   Skin prepped in a sterile fashion.   Local anesthesia: Topical Ethyl chloride.   With sterile technique and under real time ultrasound guidance: Gelsyn 2 mL injected into knee joint. Fluid seen entering the joint capsule.   Completed without difficulty   Advised to call if fevers/chills, erythema, induration, drainage, or persistent bleeding.   Images permanently stored and available for review in the ultrasound unit.  Impression: Technically successful ultrasound guided injection.  Procedure: Real-time Ultrasound Guided Injection of left knee joint superior lateral patellar space Device:  Philips Affiniti 50G/GE Logiq Images permanently stored and available for review in PACS Verbal informed consent obtained.  Discussed risks and benefits of procedure. Warned about infection, bleeding, damage to structures among others. Patient expresses understanding and agreement Time-out conducted.   Noted no overlying erythema, induration, or other signs of local infection.   Skin prepped in a sterile fashion.   Local anesthesia: Topical Ethyl chloride.   With sterile technique and under real time ultrasound guidance: Gelsyn 2 mL injected into knee joint. Fluid seen entering the joint capsule.   Completed without difficulty   Advised to call if fevers/chills, erythema, induration, drainage, or persistent bleeding.   Images permanently stored and available for review in the ultrasound unit.  Impression: Technically successful ultrasound guided injection. Lot number: [id number]      Assessment and Plan: 64 y.o. female with bilateral knee pain due to DJD.  Plan for Gelsyn series both knees starting today.  Return in 1 week for gel injections 2/3.   PDMP not reviewed this encounter. Orders Placed This Encounter  Procedures   US  LIMITED JOINT SPACE STRUCTURES LOW BILAT(NO LINKED CHARGES)    Reason for Exam (SYMPTOM  OR DIAGNOSIS REQUIRED):   bilat knee pain    Preferred imaging location?:   [facility] ordered this encounter  Medications   sodium hyaluronate (viscosup) (GELSYN-3) intra-articular injection 16.8 mg   sodium hyaluronate (viscosup) (GELSYN-3) intra-articular injection 16.8 mg     Discussed warning signs or symptoms. Please see discharge instructions. Patient expresses understanding.   The above documentation has been reviewed and is accurate and complete Artist Lloyd,  M.D.   "

## 2024-03-18 ENCOUNTER — Encounter: Admitting: Internal Medicine

## 2024-03-19 ENCOUNTER — Telehealth: Payer: Self-pay | Admitting: *Deleted

## 2024-03-19 ENCOUNTER — Inpatient Hospital Stay: Admission: RE | Admit: 2024-03-19 | Source: Ambulatory Visit

## 2024-03-19 NOTE — Telephone Encounter (Signed)
 Returned call to patient regarding Lung screening appt but had to leave VM for patient to call back.

## 2024-03-21 ENCOUNTER — Ambulatory Visit: Admitting: Family Medicine

## 2024-03-21 ENCOUNTER — Other Ambulatory Visit: Payer: Self-pay

## 2024-03-21 DIAGNOSIS — M170 Bilateral primary osteoarthritis of knee: Secondary | ICD-10-CM | POA: Diagnosis not present

## 2024-03-21 DIAGNOSIS — M25561 Pain in right knee: Secondary | ICD-10-CM

## 2024-03-21 DIAGNOSIS — M25562 Pain in left knee: Secondary | ICD-10-CM | POA: Diagnosis not present

## 2024-03-21 DIAGNOSIS — G8929 Other chronic pain: Secondary | ICD-10-CM

## 2024-03-21 MED ORDER — SODIUM HYALURONATE (VISCOSUP) 16.8 MG/2ML IX SOSY
16.8000 mg | PREFILLED_SYRINGE | Freq: Once | INTRA_ARTICULAR | Status: AC
  Administered 2024-03-21: 16.8 mg via INTRA_ARTICULAR

## 2024-03-21 NOTE — Patient Instructions (Signed)
 Thank you for coming in today.   Schedule in about 2 weeks for the third injection  Call or go to the ER if you develop a large red swollen joint with extreme pain or oozing puss.

## 2024-03-21 NOTE — Progress Notes (Signed)
 Darlene Gonzalez presents to clinic today for Gelsyn injection bilateral knee 2/3 Procedure: Real-time Ultrasound Guided Injection of right knee joint superior lateral patellar space Device: Philips Affiniti 50G/GE Logiq Images permanently stored and available for review in PACS Verbal informed consent obtained.  Discussed risks and benefits of procedure. Warned about infection, bleeding, damage to structures among others. Patient expresses understanding and agreement Time-out conducted.   Noted no overlying erythema, induration, or other signs of local infection.   Skin prepped in a sterile fashion.   Local anesthesia: Topical Ethyl chloride.   With sterile technique and under real time ultrasound guidance: Gelsyn 2 mL injected into knee joint. Fluid seen entering the joint capsule.   Completed without difficulty   Advised to call if fevers/chills, erythema, induration, drainage, or persistent bleeding.   Images permanently stored and available for review in the ultrasound unit.  Impression: Technically successful ultrasound guided injection.  Procedure: Real-time Ultrasound Guided Injection of left knee joint superior lateral patellar space Device: Philips Affiniti 50G/GE Logiq Images permanently stored and available for review in PACS Verbal informed consent obtained.  Discussed risks and benefits of procedure. Warned about infection, bleeding, damage to structures among others. Patient expresses understanding and agreement Time-out conducted.   Noted no overlying erythema, induration, or other signs of local infection.   Skin prepped in a sterile fashion.   Local anesthesia: Topical Ethyl chloride.   With sterile technique and under real time ultrasound guidance: Gelsyn 2 mL injected into knee joint. Fluid seen entering the joint capsule.   Completed without difficulty   Advised to call if fevers/chills, erythema, induration, drainage, or persistent bleeding.   Images permanently stored  and available for review in the ultrasound unit.  Impression: Technically successful ultrasound guided injection. Lot number: [id number]

## 2024-03-22 NOTE — Telephone Encounter (Signed)
 Patient called in and left another VM. Returned call but had to leave another VM for pt to call back.

## 2024-03-28 ENCOUNTER — Ambulatory Visit: Admitting: Nurse Practitioner

## 2024-03-28 ENCOUNTER — Encounter: Payer: Self-pay | Admitting: Nurse Practitioner

## 2024-03-28 ENCOUNTER — Other Ambulatory Visit

## 2024-03-28 VITALS — BP 124/82 | HR 82 | Temp 98.1°F | Ht 68.5 in | Wt 243.6 lb

## 2024-03-28 DIAGNOSIS — F5101 Primary insomnia: Secondary | ICD-10-CM

## 2024-03-28 DIAGNOSIS — L409 Psoriasis, unspecified: Secondary | ICD-10-CM

## 2024-03-28 DIAGNOSIS — I10 Essential (primary) hypertension: Secondary | ICD-10-CM

## 2024-03-28 DIAGNOSIS — R0683 Snoring: Secondary | ICD-10-CM | POA: Insufficient documentation

## 2024-03-28 DIAGNOSIS — R400 Somnolence: Secondary | ICD-10-CM | POA: Insufficient documentation

## 2024-03-28 DIAGNOSIS — E119 Type 2 diabetes mellitus without complications: Secondary | ICD-10-CM

## 2024-03-28 DIAGNOSIS — E7800 Pure hypercholesterolemia, unspecified: Secondary | ICD-10-CM

## 2024-03-28 DIAGNOSIS — K219 Gastro-esophageal reflux disease without esophagitis: Secondary | ICD-10-CM

## 2024-03-28 DIAGNOSIS — Z126 Encounter for screening for malignant neoplasm of bladder: Secondary | ICD-10-CM

## 2024-03-28 DIAGNOSIS — Z0000 Encounter for general adult medical examination without abnormal findings: Secondary | ICD-10-CM

## 2024-03-28 MED ORDER — PANTOPRAZOLE SODIUM 40 MG PO TBEC: 40.0000 mg | DELAYED_RELEASE_TABLET | Freq: Every day | ORAL | 1 refills | Status: AC

## 2024-03-28 MED ORDER — OZEMPIC (0.25 OR 0.5 MG/DOSE) 2 MG/3ML ~~LOC~~ SOPN: 0.2500 mg | PEN_INJECTOR | SUBCUTANEOUS | 0 refills | Status: AC

## 2024-03-28 NOTE — Assessment & Plan Note (Signed)
 Patient currently maintained on losartan  100 mg daily and Maxide daily.  Patient's blood pressure controlled.  Continue medication as prescribed.  Pending CMP today

## 2024-03-28 NOTE — Assessment & Plan Note (Signed)
 History of the same.  Patient currently maintained on simvastatin  40 mg daily.  Pending lipid panel

## 2024-03-28 NOTE — Assessment & Plan Note (Signed)
 Pending HST

## 2024-03-28 NOTE — Assessment & Plan Note (Signed)
 Snoring with difficulty staying asleep and having daytime sleepiness given patient's comorbidities she is diabetic and morbidly obese and hypertensive at home sleep test

## 2024-03-28 NOTE — Progress Notes (Signed)
 "  Established Patient Office Visit  Subjective   Patient ID: Darlene Gonzalez, female    DOB: 1960-03-28  Age: 64 y.o. MRN: [id number]  Chief Complaint  Patient presents with   Annual Exam    HPI  HTN: Patient currently maintained on losartan  100 mg daily and maxzide.  Does not check blood pressure at home.  Denies lightheadedness dizziness  DM2: Currently maintained on semaglutide  1 mg weekly. Had been off the dose for 3 weeks.  Patient been sick and stopped taking Ozempic .  Patient wanted to restart  GERD: Currently maintained on Protonix  40 mg daily. States that she was follwed by GI. States that she has not been drinking milk as she has been   HLD: Currently maintained on simvastatin  40 mg daily  Insomnia: Maintained on Ambien  10 mg nightly as needed.  Patient still having trouble with frequent night awakenings and daytime sleepiness  for complete physical and follow up of chronic conditions.  Immunizations: -Tetanus: Completed in 2022 -Influenza: 12/2023 -Shingles: Completed per patient report -Pneumonia: Completed 2022  Diet: Fair diet. She is eating 1 meal a day. She is drinking sodas and trying to cut Exercise: No regular exercise. Going to start  Eye exam:  Needs to update   Dental exam: Completes semi-annually    Colonoscopy: Completed in 2016, patient due.  Patient was referred March 01, 2024.  She is waiting on [facility] as she has to have an inpatient procedure due to complications of anesthesia Lung Cancer Screening: Completed in 12/26/2022, repeat 12 months.  Patient is scheduled for 04/04/2024  Pap smear: 03/20/2023, negative cells negative HPV.  Patient due 2030  Mammogram: 01/24/2024 negative repeat 1 year  DEXA: Too young  Sleep: states that she goes to bed and gets up early Sleeps 315-6 and will get up around 7 and then go back to sleep and then wkae up . She is waking up a lot and does not stay asleep. She is snoring       Review of Systems   Constitutional:  Negative for chills and fever.  Respiratory:  Negative for shortness of breath.   Cardiovascular:  Negative for chest pain and leg swelling.  Gastrointestinal:  Negative for abdominal pain, blood in stool, constipation, diarrhea, nausea and vomiting.       BM every 2-3 days   Genitourinary:  Negative for dysuria and hematuria.  Neurological:  Negative for tingling and headaches.  Psychiatric/Behavioral:  Negative for hallucinations and suicidal ideas.       Objective:     BP 124/82   Pulse 82   Temp 98.1 F (36.7 C) (Oral)   Ht 5' 8.5 (1.74 m)   Wt 243 lb 9.6 oz (110.5 kg)   SpO2 98%   BMI 36.50 kg/m  BP Readings from Last 3 Encounters:  03/28/24 124/82  12/27/23 124/60  12/13/23 130/70   Wt Readings from Last 3 Encounters:  03/28/24 243 lb 9.6 oz (110.5 kg)  12/27/23 245 lb 12.8 oz (111.5 kg)  12/13/23 251 lb (113.9 kg)   SpO2 Readings from Last 3 Encounters:  03/28/24 98%  12/27/23 99%  12/13/23 98%      Physical Exam Vitals and nursing note reviewed.  Constitutional:      Appearance: Normal appearance.  HENT:     Right Ear: Tympanic membrane, ear canal and external ear normal.     Left Ear: Tympanic membrane, ear canal and external ear normal.     Mouth/Throat:  Mouth: Mucous membranes are moist.     Pharynx: Oropharynx is clear.  Eyes:     Extraocular Movements: Extraocular movements intact.     Pupils: Pupils are equal, round, and reactive to light.  Cardiovascular:     Rate and Rhythm: Normal rate and regular rhythm.     Pulses: Normal pulses.     Heart sounds: Normal heart sounds.  Pulmonary:     Effort: Pulmonary effort is normal.     Breath sounds: Normal breath sounds.  Abdominal:     General: Bowel sounds are normal. There is no distension.     Palpations: There is no mass.     Tenderness: There is no abdominal tenderness.     Hernia: No hernia is present.  Musculoskeletal:     Right lower leg: No edema.     Left  lower leg: No edema.  Lymphadenopathy:     Cervical: No cervical adenopathy.  Skin:    General: Skin is warm.  Neurological:     General: No focal deficit present.     Mental Status: She is alert.     Deep Tendon Reflexes:     Reflex Scores:      Bicep reflexes are 2+ on the right side and 2+ on the left side.      Patellar reflexes are 2+ on the right side and 2+ on the left side.    Comments: Bilateral upper and lower extremity strength 5/5  Psychiatric:        Mood and Affect: Mood normal.        Behavior: Behavior normal.        Thought Content: Thought content normal.        Judgment: Judgment normal.    Title   Diabetic Foot Exam - detailed Is there a history of foot ulcer?: No Is there a foot ulcer now?: No Is there swelling?: No Is there elevated skin temperature?: No Is there abnormal foot shape?: No Is there a claw toe deformity?: No Are the toenails long?: No Are the toenails thick?: No Are the toenails ingrown?: No Pulse Foot Exam completed.: Yes   Right Posterior Tibialis: Present Left posterior Tibialis: Present   Right Dorsalis Pedis: Present Left Dorsalis Pedis: Present     Semmes-Weinstein Monofilament Test + means has sensation and - means no sensation      Image components are not supported.   Image components are not supported. Image components are not supported.  Tuning [place] Comments All 10 sites tested sensation intact bilaterally      No results found for any visits on 03/28/24.    The 10-year ASCVD risk score (Arnett DK, et al., 2019) is: 31.9%    Assessment & Plan:   Problem List Items Addressed This Visit       Cardiovascular and Mediastinum   Essential hypertension (Chronic)   Patient currently maintained on losartan  100 mg daily and Maxide daily.  Patient's blood pressure controlled.  Continue medication as prescribed.  Pending CMP today      Relevant Medications   triamterene -hydrochlorothiazide (MAXZIDE-25)  37.5-25 MG tablet   Other Relevant Orders   Comprehensive metabolic panel with GFR   CBC with Differential/Platelet   Hemoglobin A1c   TSH   Microalbumin / creatinine urine ratio   Lipid panel     Digestive   GERD (Chronic)   History of the same.  Patient currently maintained on Protonix  40 mg daily.  Refill provided  Relevant Medications   pantoprazole  (PROTONIX ) 40 MG tablet     Endocrine   Controlled type 2 diabetes mellitus without complication, without long-term current use of insulin (HCC)   Patient was on Ozempic  up to 1 mg.  Got sick and has been off medication for 3 weeks we will restart titration at 0.25 mg weekly for 4 weeks then 0.5 mg weekly for 4 weeks and then 1 mg weekly thereafter.      Relevant Medications   Semaglutide ,0.25 or 0.5MG /DOS, (OZEMPIC , 0.25 OR 0.5 MG/DOSE,) 2 MG/3ML SOPN   Other Relevant Orders   Comprehensive metabolic panel with GFR   CBC with Differential/Platelet   Hemoglobin A1c   Microalbumin / creatinine urine ratio   Lipid panel     Musculoskeletal and Integument   Psoriasis   History of the same was being followed by dermatology has clobetasol  cream to use as needed.  Continue following with dermatology        Other   Insomnia   History of the same.  Maintained on Ambien  10 mg nightly as needed.  Pending at home sleep test      Relevant Orders   Ambulatory referral to Sleep Studies   Hyperlipidemia   History of the same.  Patient currently maintained on simvastatin  40 mg daily.  Pending lipid panel      Relevant Medications   triamterene -hydrochlorothiazide (MAXZIDE-25) 37.5-25 MG tablet   Other Relevant Orders   Lipid panel   Preventative health care - Primary   Discussed age-appropriate immunization and screening exams.  Did review patient's personal, surgical, social, family histories.  Patient is up-to-date with all age-appropriate vaccinations she would like.  Patient is due for CRC screening.  She has been referred  and is working with [facility] to get that set up.  Mammogram is up-to-date.  Cervical cancer screening up-to-date.  Patient was given information at discharge about preventative healthcare maintenance with anticipatory guidance.      Snoring   Snoring with difficulty staying asleep and having daytime sleepiness given patient's comorbidities she is diabetic and morbidly obese and hypertensive at home sleep test      Relevant Orders   Ambulatory referral to Sleep Studies   Daytime sleepiness   Pending HST      Relevant Orders   Ambulatory referral to Sleep Studies   Other Visit Diagnoses       Screening for bladder cancer       Relevant Orders   Urine Microscopic       Return in about 6 months (around 09/25/2024) for DM recheck.    Adina Crandall, NP  "

## 2024-03-28 NOTE — Assessment & Plan Note (Signed)
 History of the same was being followed by dermatology has clobetasol  cream to use as needed.  Continue following with dermatology

## 2024-03-28 NOTE — Assessment & Plan Note (Signed)
 History of the same.  Maintained on Ambien  10 mg nightly as needed.  Pending at home sleep test

## 2024-03-28 NOTE — Assessment & Plan Note (Signed)
 Discussed age-appropriate immunization and screening exams.  Did review patient's personal, surgical, social, family histories.  Patient is up-to-date with all age-appropriate vaccinations she would like.  Patient is due for CRC screening.  She has been referred and is working with [facility] to get that set up.  Mammogram is up-to-date.  Cervical cancer screening up-to-date.  Patient was given information at discharge about preventative healthcare maintenance with anticipatory guidance.

## 2024-03-28 NOTE — Assessment & Plan Note (Signed)
 Patient was on Ozempic  up to 1 mg.  Got sick and has been off medication for 3 weeks we will restart titration at 0.25 mg weekly for 4 weeks then 0.5 mg weekly for 4 weeks and then 1 mg weekly thereafter.

## 2024-03-28 NOTE — Assessment & Plan Note (Signed)
 History of the same.  Patient currently maintained on Protonix  40 mg daily.  Refill provided

## 2024-03-28 NOTE — Patient Instructions (Signed)
Nice to see you today I will be in touch with the labs once I have them Follow up with me in 6 months

## 2024-03-29 LAB — CBC WITH DIFFERENTIAL/PLATELET
Basophils Absolute: 0 10*3/uL (ref 0.0–0.1)
Basophils Relative: 0.6 % (ref 0.0–3.0)
Eosinophils Absolute: 0.2 10*3/uL (ref 0.0–0.7)
Eosinophils Relative: 2.9 % (ref 0.0–5.0)
HCT: 37.9 % (ref 36.0–46.0)
Hemoglobin: 12.7 g/dL (ref 12.0–15.0)
Lymphocytes Relative: 27.4 % (ref 12.0–46.0)
Lymphs Abs: 2.1 10*3/uL (ref 0.7–4.0)
MCHC: 33.6 g/dL (ref 30.0–36.0)
MCV: 91.4 fl (ref 78.0–100.0)
Monocytes Absolute: 0.7 10*3/uL (ref 0.1–1.0)
Monocytes Relative: 8.3 % (ref 3.0–12.0)
Neutro Abs: 4.8 10*3/uL (ref 1.4–7.7)
Neutrophils Relative %: 60.8 % (ref 43.0–77.0)
Platelets: 252 10*3/uL (ref 150.0–400.0)
RBC: 4.15 Mil/uL (ref 3.87–5.11)
RDW: 13.6 % (ref 11.5–15.5)
WBC: 7.8 10*3/uL (ref 4.0–10.5)

## 2024-03-29 LAB — COMPREHENSIVE METABOLIC PANEL WITH GFR
ALT: 20 U/L (ref 3–35)
AST: 20 U/L (ref 5–37)
Albumin: 4.1 g/dL (ref 3.5–5.2)
Alkaline Phosphatase: 95 U/L (ref 39–117)
BUN: 12 mg/dL (ref 6–23)
CO2: 34 meq/L — ABNORMAL HIGH (ref 19–32)
Calcium: 9 mg/dL (ref 8.4–10.5)
Chloride: 99 meq/L (ref 96–112)
Creatinine, Ser: 0.89 mg/dL (ref 0.40–1.20)
GFR: 68.5 mL/min
Glucose, Bld: 93 mg/dL (ref 70–99)
Potassium: 3.3 meq/L — ABNORMAL LOW (ref 3.5–5.1)
Sodium: 140 meq/L (ref 135–145)
Total Bilirubin: 0.5 mg/dL (ref 0.2–1.2)
Total Protein: 6.9 g/dL (ref 6.0–8.3)

## 2024-03-29 LAB — URINALYSIS, MICROSCOPIC ONLY

## 2024-03-29 LAB — LIPID PANEL
Cholesterol: 111 mg/dL (ref 28–200)
HDL: 34 mg/dL — ABNORMAL LOW
LDL Cholesterol: 56 mg/dL (ref 10–99)
NonHDL: 76.87
Total CHOL/HDL Ratio: 3
Triglycerides: 104 mg/dL (ref 10.0–149.0)
VLDL: 20.8 mg/dL (ref 0.0–40.0)

## 2024-03-29 LAB — TSH: TSH: 2.43 u[IU]/mL (ref 0.35–5.50)

## 2024-03-29 LAB — MICROALBUMIN / CREATININE URINE RATIO
Creatinine,U: 206.9 mg/dL
Microalb Creat Ratio: 4.8 mg/g (ref 0.0–30.0)
Microalb, Ur: 1 mg/dL (ref 0.7–1.9)

## 2024-03-29 LAB — HEMOGLOBIN A1C: Hgb A1c MFr Bld: 6.5 % (ref 4.6–6.5)

## 2024-04-03 ENCOUNTER — Ambulatory Visit: Admitting: Family Medicine

## 2024-04-03 ENCOUNTER — Ambulatory Visit (HOSPITAL_COMMUNITY): Admit: 2024-04-03 | Admitting: Internal Medicine

## 2024-04-03 ENCOUNTER — Encounter (HOSPITAL_COMMUNITY): Payer: Self-pay

## 2024-04-03 SURGERY — COLONOSCOPY
Anesthesia: Monitor Anesthesia Care

## 2024-04-04 ENCOUNTER — Other Ambulatory Visit

## 2024-09-25 ENCOUNTER — Ambulatory Visit: Admitting: Nurse Practitioner
